# Patient Record
Sex: Female | Born: 1955 | Race: Black or African American | Hispanic: No | Smoking: Current every day smoker
Health system: Southern US, Community
[De-identification: ages and names within clinical notes are randomized; demographics above are authoritative.]

## PROBLEM LIST (undated history)

## (undated) DIAGNOSIS — E119 Type 2 diabetes mellitus without complications: Secondary | ICD-10-CM

## (undated) DIAGNOSIS — I739 Peripheral vascular disease, unspecified: Secondary | ICD-10-CM

## (undated) DIAGNOSIS — I10 Essential (primary) hypertension: Secondary | ICD-10-CM

## (undated) DIAGNOSIS — G629 Polyneuropathy, unspecified: Secondary | ICD-10-CM

## (undated) HISTORY — PX: THROMBECTOMY: SHX45

---

## 2005-07-15 ENCOUNTER — Emergency Department: Payer: Self-pay | Admitting: Emergency Medicine

## 2005-07-15 ENCOUNTER — Other Ambulatory Visit: Payer: Self-pay

## 2005-12-18 ENCOUNTER — Ambulatory Visit: Payer: Self-pay | Admitting: Psychiatry

## 2006-02-06 ENCOUNTER — Ambulatory Visit: Payer: Self-pay | Admitting: Pain Medicine

## 2006-02-22 ENCOUNTER — Ambulatory Visit: Payer: Self-pay | Admitting: Pain Medicine

## 2006-02-28 ENCOUNTER — Ambulatory Visit: Payer: Self-pay | Admitting: Pain Medicine

## 2006-04-05 ENCOUNTER — Ambulatory Visit: Payer: Self-pay | Admitting: Pain Medicine

## 2006-04-16 ENCOUNTER — Ambulatory Visit: Payer: Self-pay | Admitting: Pain Medicine

## 2006-05-22 ENCOUNTER — Ambulatory Visit: Payer: Self-pay | Admitting: Pain Medicine

## 2006-05-28 ENCOUNTER — Ambulatory Visit: Payer: Self-pay | Admitting: Pain Medicine

## 2006-07-12 ENCOUNTER — Ambulatory Visit: Payer: Self-pay | Admitting: Pain Medicine

## 2006-07-26 ENCOUNTER — Ambulatory Visit: Payer: Self-pay | Admitting: Internal Medicine

## 2006-07-30 ENCOUNTER — Ambulatory Visit: Payer: Self-pay | Admitting: Pain Medicine

## 2006-08-28 ENCOUNTER — Ambulatory Visit: Payer: Self-pay | Admitting: Pain Medicine

## 2006-09-03 ENCOUNTER — Ambulatory Visit: Payer: Self-pay | Admitting: Pain Medicine

## 2006-10-25 ENCOUNTER — Ambulatory Visit: Payer: Self-pay | Admitting: Pain Medicine

## 2006-10-31 ENCOUNTER — Ambulatory Visit: Payer: Self-pay | Admitting: Pain Medicine

## 2006-12-04 ENCOUNTER — Ambulatory Visit: Payer: Self-pay | Admitting: Pain Medicine

## 2006-12-12 ENCOUNTER — Ambulatory Visit: Payer: Self-pay | Admitting: Pain Medicine

## 2007-01-30 ENCOUNTER — Ambulatory Visit: Payer: Self-pay | Admitting: Pain Medicine

## 2007-03-14 ENCOUNTER — Ambulatory Visit: Payer: Self-pay | Admitting: Pain Medicine

## 2007-03-20 ENCOUNTER — Ambulatory Visit: Payer: Self-pay | Admitting: Pain Medicine

## 2007-05-02 ENCOUNTER — Ambulatory Visit: Payer: Self-pay | Admitting: Pain Medicine

## 2007-05-06 ENCOUNTER — Ambulatory Visit: Payer: Self-pay | Admitting: Pain Medicine

## 2007-06-11 ENCOUNTER — Ambulatory Visit: Payer: Self-pay | Admitting: Pain Medicine

## 2007-06-22 ENCOUNTER — Ambulatory Visit: Payer: Self-pay | Admitting: Pain Medicine

## 2007-06-26 ENCOUNTER — Ambulatory Visit: Payer: Self-pay | Admitting: Pain Medicine

## 2007-08-22 ENCOUNTER — Ambulatory Visit: Payer: Self-pay | Admitting: Pain Medicine

## 2007-09-02 ENCOUNTER — Ambulatory Visit: Payer: Self-pay | Admitting: Pain Medicine

## 2007-10-15 ENCOUNTER — Ambulatory Visit: Payer: Self-pay | Admitting: Pain Medicine

## 2007-10-30 ENCOUNTER — Ambulatory Visit: Payer: Self-pay | Admitting: Pain Medicine

## 2007-11-14 ENCOUNTER — Ambulatory Visit: Payer: Self-pay | Admitting: Pain Medicine

## 2007-11-20 ENCOUNTER — Ambulatory Visit: Payer: Self-pay | Admitting: Pain Medicine

## 2007-12-10 ENCOUNTER — Ambulatory Visit: Payer: Self-pay | Admitting: Pain Medicine

## 2007-12-18 ENCOUNTER — Ambulatory Visit: Payer: Self-pay | Admitting: Pain Medicine

## 2007-12-27 ENCOUNTER — Ambulatory Visit: Payer: Self-pay | Admitting: Cardiovascular Disease

## 2008-01-02 ENCOUNTER — Ambulatory Visit: Payer: Self-pay | Admitting: Pain Medicine

## 2008-01-06 ENCOUNTER — Ambulatory Visit: Payer: Self-pay | Admitting: Pain Medicine

## 2008-02-11 ENCOUNTER — Ambulatory Visit: Payer: Self-pay | Admitting: Internal Medicine

## 2008-03-11 ENCOUNTER — Ambulatory Visit: Payer: Self-pay | Admitting: Pain Medicine

## 2008-04-07 ENCOUNTER — Ambulatory Visit: Payer: Self-pay | Admitting: Pain Medicine

## 2008-04-13 ENCOUNTER — Ambulatory Visit: Payer: Self-pay | Admitting: Pain Medicine

## 2008-05-05 ENCOUNTER — Ambulatory Visit: Payer: Self-pay | Admitting: Pain Medicine

## 2008-05-13 ENCOUNTER — Ambulatory Visit: Payer: Self-pay | Admitting: Pain Medicine

## 2008-06-04 ENCOUNTER — Ambulatory Visit: Payer: Self-pay | Admitting: Pain Medicine

## 2008-06-10 ENCOUNTER — Ambulatory Visit: Payer: Self-pay | Admitting: Pain Medicine

## 2008-07-15 ENCOUNTER — Ambulatory Visit: Payer: Self-pay | Admitting: Pain Medicine

## 2008-08-18 ENCOUNTER — Ambulatory Visit: Payer: Self-pay | Admitting: Pain Medicine

## 2008-08-26 ENCOUNTER — Ambulatory Visit: Payer: Self-pay | Admitting: Pain Medicine

## 2008-10-01 ENCOUNTER — Ambulatory Visit: Payer: Self-pay | Admitting: Pain Medicine

## 2008-10-07 ENCOUNTER — Ambulatory Visit: Payer: Self-pay | Admitting: Pain Medicine

## 2008-11-10 ENCOUNTER — Ambulatory Visit: Payer: Self-pay | Admitting: Pain Medicine

## 2008-11-18 ENCOUNTER — Ambulatory Visit: Payer: Self-pay | Admitting: Pain Medicine

## 2008-12-07 ENCOUNTER — Ambulatory Visit: Payer: Self-pay | Admitting: Pain Medicine

## 2008-12-10 ENCOUNTER — Ambulatory Visit: Payer: Self-pay | Admitting: Internal Medicine

## 2008-12-16 ENCOUNTER — Ambulatory Visit: Payer: Self-pay | Admitting: Pain Medicine

## 2009-01-07 ENCOUNTER — Ambulatory Visit: Payer: Self-pay | Admitting: Pain Medicine

## 2009-01-13 ENCOUNTER — Ambulatory Visit: Payer: Self-pay | Admitting: Pain Medicine

## 2009-02-04 ENCOUNTER — Ambulatory Visit: Payer: Self-pay | Admitting: Pain Medicine

## 2009-02-10 ENCOUNTER — Ambulatory Visit: Payer: Self-pay | Admitting: Pain Medicine

## 2009-03-02 ENCOUNTER — Ambulatory Visit: Payer: Self-pay | Admitting: Cardiovascular Disease

## 2009-03-09 ENCOUNTER — Ambulatory Visit: Payer: Self-pay | Admitting: Pain Medicine

## 2009-03-17 ENCOUNTER — Ambulatory Visit: Payer: Self-pay | Admitting: Pain Medicine

## 2009-04-06 ENCOUNTER — Ambulatory Visit: Payer: Self-pay | Admitting: Pain Medicine

## 2009-04-12 ENCOUNTER — Ambulatory Visit: Payer: Self-pay | Admitting: Pain Medicine

## 2009-04-15 ENCOUNTER — Inpatient Hospital Stay: Payer: Self-pay | Admitting: Internal Medicine

## 2009-05-26 ENCOUNTER — Encounter: Payer: Self-pay | Admitting: Internal Medicine

## 2009-06-23 ENCOUNTER — Encounter: Payer: Self-pay | Admitting: Internal Medicine

## 2009-07-23 ENCOUNTER — Encounter: Payer: Self-pay | Admitting: Internal Medicine

## 2009-07-23 ENCOUNTER — Ambulatory Visit: Payer: Self-pay | Admitting: Internal Medicine

## 2009-07-28 ENCOUNTER — Ambulatory Visit: Payer: Self-pay | Admitting: Internal Medicine

## 2009-08-05 ENCOUNTER — Ambulatory Visit: Payer: Self-pay | Admitting: Internal Medicine

## 2009-08-09 ENCOUNTER — Ambulatory Visit: Payer: Self-pay | Admitting: Gastroenterology

## 2009-08-24 ENCOUNTER — Ambulatory Visit: Payer: Self-pay | Admitting: Pain Medicine

## 2009-09-01 ENCOUNTER — Ambulatory Visit: Payer: Self-pay | Admitting: Pain Medicine

## 2009-09-20 ENCOUNTER — Ambulatory Visit: Payer: Self-pay | Admitting: Orthopedic Surgery

## 2009-09-27 ENCOUNTER — Ambulatory Visit: Payer: Self-pay | Admitting: Orthopedic Surgery

## 2010-03-20 ENCOUNTER — Emergency Department: Payer: Self-pay | Admitting: Emergency Medicine

## 2010-09-23 ENCOUNTER — Ambulatory Visit: Payer: Self-pay | Admitting: Rheumatology

## 2011-06-08 ENCOUNTER — Ambulatory Visit: Payer: Self-pay | Admitting: Pain Medicine

## 2011-06-12 ENCOUNTER — Ambulatory Visit: Payer: Self-pay | Admitting: Pain Medicine

## 2011-07-11 ENCOUNTER — Ambulatory Visit: Payer: Self-pay | Admitting: Pain Medicine

## 2011-08-01 ENCOUNTER — Ambulatory Visit: Payer: Self-pay | Admitting: Internal Medicine

## 2011-08-02 ENCOUNTER — Ambulatory Visit: Payer: Self-pay | Admitting: Pain Medicine

## 2011-10-24 HISTORY — PX: FEMORAL-POPLITEAL BYPASS GRAFT: SHX937

## 2012-02-06 ENCOUNTER — Ambulatory Visit: Payer: Self-pay | Admitting: Internal Medicine

## 2012-04-23 ENCOUNTER — Ambulatory Visit: Payer: Self-pay | Admitting: Vascular Surgery

## 2012-04-23 LAB — BASIC METABOLIC PANEL
BUN: 17 mg/dL (ref 7–18)
Chloride: 109 mmol/L — ABNORMAL HIGH (ref 98–107)
Creatinine: 1.23 mg/dL (ref 0.60–1.30)
Potassium: 4.5 mmol/L (ref 3.5–5.1)

## 2012-04-24 ENCOUNTER — Inpatient Hospital Stay: Payer: Self-pay | Admitting: Vascular Surgery

## 2012-04-24 ENCOUNTER — Emergency Department: Payer: Self-pay | Admitting: Emergency Medicine

## 2012-04-26 LAB — CBC WITH DIFFERENTIAL/PLATELET
Basophil #: 0 10*3/uL (ref 0.0–0.1)
Eosinophil #: 0.2 10*3/uL (ref 0.0–0.7)
MCH: 29.6 pg (ref 26.0–34.0)
MCHC: 32.1 g/dL (ref 32.0–36.0)
Monocyte #: 1.5 x10 3/mm — ABNORMAL HIGH (ref 0.2–0.9)
Neutrophil %: 73.9 %
Platelet: 207 10*3/uL (ref 150–440)
RBC: 3.43 10*6/uL — ABNORMAL LOW (ref 3.80–5.20)
WBC: 14.8 10*3/uL — ABNORMAL HIGH (ref 3.6–11.0)

## 2012-04-26 LAB — BASIC METABOLIC PANEL
Anion Gap: 7 (ref 7–16)
Calcium, Total: 8.4 mg/dL — ABNORMAL LOW (ref 8.5–10.1)
Chloride: 109 mmol/L — ABNORMAL HIGH (ref 98–107)
Co2: 26 mmol/L (ref 21–32)
Creatinine: 1.4 mg/dL — ABNORMAL HIGH (ref 0.60–1.30)
Osmolality: 284 (ref 275–301)

## 2012-06-05 ENCOUNTER — Ambulatory Visit: Payer: Self-pay | Admitting: Pain Medicine

## 2012-06-17 ENCOUNTER — Ambulatory Visit: Payer: Self-pay | Admitting: Pain Medicine

## 2012-07-11 ENCOUNTER — Ambulatory Visit: Payer: Self-pay | Admitting: Pain Medicine

## 2012-07-29 ENCOUNTER — Ambulatory Visit: Payer: Self-pay | Admitting: Pain Medicine

## 2012-08-06 ENCOUNTER — Ambulatory Visit: Payer: Self-pay | Admitting: Internal Medicine

## 2012-08-20 ENCOUNTER — Ambulatory Visit: Payer: Self-pay | Admitting: Pain Medicine

## 2012-08-20 ENCOUNTER — Ambulatory Visit: Payer: Self-pay | Admitting: Otolaryngology

## 2012-08-20 LAB — CREATININE, SERUM
Creatinine: 1.22 mg/dL (ref 0.60–1.30)
EGFR (Non-African Amer.): 49 — ABNORMAL LOW

## 2012-09-04 ENCOUNTER — Ambulatory Visit: Payer: Self-pay | Admitting: Pain Medicine

## 2012-10-03 ENCOUNTER — Ambulatory Visit: Payer: Self-pay | Admitting: Pain Medicine

## 2012-10-09 ENCOUNTER — Ambulatory Visit: Payer: Self-pay | Admitting: Pain Medicine

## 2012-10-09 LAB — CREATININE, SERUM: EGFR (Non-African Amer.): 45 — ABNORMAL LOW

## 2012-10-14 ENCOUNTER — Ambulatory Visit: Payer: Self-pay | Admitting: Pain Medicine

## 2012-10-29 ENCOUNTER — Ambulatory Visit: Payer: Self-pay | Admitting: Pain Medicine

## 2012-10-30 ENCOUNTER — Inpatient Hospital Stay: Payer: Self-pay | Admitting: Vascular Surgery

## 2012-10-30 LAB — CBC
HCT: 40.4 % (ref 35.0–47.0)
HGB: 13 g/dL (ref 12.0–16.0)
MCHC: 32.2 g/dL (ref 32.0–36.0)
MCV: 93 fL (ref 80–100)
Platelet: 298 10*3/uL (ref 150–440)
RBC: 4.33 10*6/uL (ref 3.80–5.20)
WBC: 7.7 10*3/uL (ref 3.6–11.0)

## 2012-10-30 LAB — COMPREHENSIVE METABOLIC PANEL
Alkaline Phosphatase: 64 U/L (ref 50–136)
BUN: 18 mg/dL (ref 7–18)
EGFR (African American): 49 — ABNORMAL LOW
EGFR (Non-African Amer.): 42 — ABNORMAL LOW
Glucose: 113 mg/dL — ABNORMAL HIGH (ref 65–99)
Osmolality: 293 (ref 275–301)
SGPT (ALT): 58 U/L (ref 12–78)
Sodium: 146 mmol/L — ABNORMAL HIGH (ref 136–145)
Total Protein: 6 g/dL — ABNORMAL LOW (ref 6.4–8.2)

## 2012-10-30 LAB — APTT: Activated PTT: 30.9 secs (ref 23.6–35.9)

## 2012-10-30 LAB — PROTIME-INR: Prothrombin Time: 13 secs (ref 11.5–14.7)

## 2012-10-31 LAB — CBC WITH DIFFERENTIAL/PLATELET
Basophil #: 0 10*3/uL (ref 0.0–0.1)
Basophil #: 0.1 10*3/uL (ref 0.0–0.1)
Basophil %: 0.2 %
Basophil %: 0.8 %
Eosinophil #: 0.2 10*3/uL (ref 0.0–0.7)
Eosinophil %: 3.4 %
Eosinophil %: 1.3 %
HCT: 36.3 % (ref 35.0–47.0)
HCT: 35.5 % (ref 35.0–47.0)
HGB: 11.9 g/dL — ABNORMAL LOW (ref 12.0–16.0)
HGB: 11.6 g/dL — ABNORMAL LOW (ref 12.0–16.0)
Lymphocyte #: 2.5 10*3/uL (ref 1.0–3.6)
Lymphocyte #: 1.5 10*3/uL (ref 1.0–3.6)
Lymphocyte %: 33.2 %
Lymphocyte %: 34 %
Lymphocyte %: 16.2 %
MCH: 30.8 pg (ref 26.0–34.0)
MCH: 30.7 pg (ref 26.0–34.0)
MCV: 94 fL (ref 80–100)
Monocyte #: 0.5 x10 3/mm (ref 0.2–0.9)
Monocyte #: 0.5 x10 3/mm (ref 0.2–0.9)
Monocyte #: 0.8 x10 3/mm (ref 0.2–0.9)
Monocyte %: 7.1 %
Monocyte %: 8.7 %
Neutrophil #: 4.3 10*3/uL (ref 1.4–6.5)
Neutrophil #: 6.6 10*3/uL — ABNORMAL HIGH (ref 1.4–6.5)
Neutrophil %: 57.1 %
Platelet: 257 10*3/uL (ref 150–440)
RBC: 3.76 10*6/uL — ABNORMAL LOW (ref 3.80–5.20)
RDW: 12.9 % (ref 11.5–14.5)
WBC: 9.1 10*3/uL (ref 3.6–11.0)

## 2012-10-31 LAB — BASIC METABOLIC PANEL
Anion Gap: 10 (ref 7–16)
BUN: 22 mg/dL — ABNORMAL HIGH (ref 7–18)
Calcium, Total: 8.4 mg/dL — ABNORMAL LOW (ref 8.5–10.1)
Chloride: 106 mmol/L (ref 98–107)
Co2: 29 mmol/L (ref 21–32)
EGFR (African American): 41 — ABNORMAL LOW
EGFR (Non-African Amer.): 35 — ABNORMAL LOW
Potassium: 3.1 mmol/L — ABNORMAL LOW (ref 3.5–5.1)
Sodium: 145 mmol/L (ref 136–145)

## 2012-10-31 LAB — APTT: Activated PTT: 125.5 secs — ABNORMAL HIGH (ref 23.6–35.9)

## 2012-11-01 LAB — BASIC METABOLIC PANEL WITH GFR
Anion Gap: 5 — ABNORMAL LOW
BUN: 14 mg/dL
Calcium, Total: 7.7 mg/dL — ABNORMAL LOW
Chloride: 110 mmol/L — ABNORMAL HIGH
Co2: 30 mmol/L
Creatinine: 1.11 mg/dL
EGFR (African American): >60
EGFR (Non-African Amer.): 55 — ABNORMAL LOW
Glucose: 109 mg/dL — ABNORMAL HIGH
Osmolality: 290
Potassium: 3.6 mmol/L
Sodium: 145 mmol/L

## 2012-11-28 ENCOUNTER — Ambulatory Visit: Payer: Self-pay | Admitting: Pain Medicine

## 2012-12-11 ENCOUNTER — Ambulatory Visit: Payer: Self-pay | Admitting: Pain Medicine

## 2012-12-24 ENCOUNTER — Inpatient Hospital Stay: Payer: Self-pay | Admitting: Physician Assistant

## 2012-12-24 LAB — CBC
HGB: 13.8 g/dL (ref 12.0–16.0)
MCH: 29.8 pg (ref 26.0–34.0)
MCV: 94 fL (ref 80–100)
Platelet: 362 10*3/uL (ref 150–440)
RBC: 4.62 10*6/uL (ref 3.80–5.20)
WBC: 8.1 10*3/uL (ref 3.6–11.0)

## 2012-12-24 LAB — COMPREHENSIVE METABOLIC PANEL
Alkaline Phosphatase: 63 U/L (ref 50–136)
Anion Gap: 3 — ABNORMAL LOW (ref 7–16)
BUN: 19 mg/dL — ABNORMAL HIGH (ref 7–18)
Calcium, Total: 8.8 mg/dL (ref 8.5–10.1)
EGFR (African American): 52 — ABNORMAL LOW
EGFR (Non-African Amer.): 45 — ABNORMAL LOW
Glucose: 64 mg/dL — ABNORMAL LOW (ref 65–99)
Osmolality: 285 (ref 275–301)
Potassium: 3.3 mmol/L — ABNORMAL LOW (ref 3.5–5.1)
Total Protein: 6.7 g/dL (ref 6.4–8.2)

## 2012-12-24 LAB — APTT: Activated PTT: 29.7 secs (ref 23.6–35.9)

## 2012-12-24 LAB — PROTIME-INR: Prothrombin Time: 13 secs (ref 11.5–14.7)

## 2012-12-25 LAB — APTT
Activated PTT: 86.3 secs — ABNORMAL HIGH (ref 23.6–35.9)
Activated PTT: 69.4 secs — ABNORMAL HIGH (ref 23.6–35.9)

## 2012-12-26 LAB — BASIC METABOLIC PANEL
BUN: 18 mg/dL (ref 7–18)
Creatinine: 1.28 mg/dL (ref 0.60–1.30)
EGFR (Non-African Amer.): 46 — ABNORMAL LOW
Osmolality: 284 (ref 275–301)
Potassium: 3.7 mmol/L (ref 3.5–5.1)
Sodium: 142 mmol/L (ref 136–145)

## 2012-12-26 LAB — APTT: Activated PTT: 52.1 secs — ABNORMAL HIGH (ref 23.6–35.9)

## 2012-12-26 LAB — CBC WITH DIFFERENTIAL/PLATELET
Basophil #: 0 10*3/uL (ref 0.0–0.1)
Basophil %: 0.7 %
Eosinophil #: 0.2 10*3/uL (ref 0.0–0.7)
Eosinophil %: 2.9 %
HCT: 38.1 % (ref 35.0–47.0)
Lymphocyte #: 2.2 10*3/uL (ref 1.0–3.6)
MCV: 94 fL (ref 80–100)
Monocyte %: 11.9 %
Neutrophil #: 3.3 10*3/uL (ref 1.4–6.5)
RDW: 13.6 % (ref 11.5–14.5)
WBC: 6.5 10*3/uL (ref 3.6–11.0)

## 2012-12-27 LAB — BASIC METABOLIC PANEL
Anion Gap: 8 (ref 7–16)
BUN: 12 mg/dL (ref 7–18)
Calcium, Total: 8.3 mg/dL — ABNORMAL LOW (ref 8.5–10.1)
Chloride: 107 mmol/L (ref 98–107)
Co2: 27 mmol/L (ref 21–32)
EGFR (Non-African Amer.): >60
Glucose: 137 mg/dL — ABNORMAL HIGH (ref 65–99)
Osmolality: 285 (ref 275–301)
Sodium: 142 mmol/L (ref 136–145)

## 2012-12-27 LAB — APTT: Activated PTT: 75.7 secs — ABNORMAL HIGH (ref 23.6–35.9)

## 2012-12-28 LAB — APTT: Activated PTT: 36.3 secs — ABNORMAL HIGH (ref 23.6–35.9)

## 2012-12-28 LAB — CBC WITH DIFFERENTIAL/PLATELET
Basophil #: 0.1 10*3/uL (ref 0.0–0.1)
Eosinophil %: 1 %
HGB: 11.7 g/dL — ABNORMAL LOW (ref 12.0–16.0)
MCHC: 32.2 g/dL (ref 32.0–36.0)
Monocyte #: 0.8 x10 3/mm (ref 0.2–0.9)
Monocyte %: 6.6 %
Neutrophil #: 9.2 10*3/uL — ABNORMAL HIGH (ref 1.4–6.5)
RBC: 3.87 10*6/uL (ref 3.80–5.20)
RDW: 13.2 % (ref 11.5–14.5)
WBC: 12.5 10*3/uL — ABNORMAL HIGH (ref 3.6–11.0)

## 2012-12-29 LAB — CBC WITH DIFFERENTIAL/PLATELET
Basophil %: 0.7 %
Lymphocyte %: 15.8 %
MCH: 31.3 pg (ref 26.0–34.0)
MCHC: 33.5 g/dL (ref 32.0–36.0)
Monocyte %: 12.2 %
Neutrophil #: 6.9 10*3/uL — ABNORMAL HIGH (ref 1.4–6.5)
Neutrophil %: 69.2 %
Platelet: 225 10*3/uL (ref 150–440)
RBC: 3.46 10*6/uL — ABNORMAL LOW (ref 3.80–5.20)
RDW: 13.1 % (ref 11.5–14.5)

## 2012-12-29 LAB — BASIC METABOLIC PANEL
Anion Gap: 7 (ref 7–16)
BUN: 11 mg/dL (ref 7–18)
Calcium, Total: 8.7 mg/dL (ref 8.5–10.1)
Chloride: 103 mmol/L (ref 98–107)
Co2: 30 mmol/L (ref 21–32)
Glucose: 126 mg/dL — ABNORMAL HIGH (ref 65–99)
Osmolality: 280 (ref 275–301)
Potassium: 3.7 mmol/L (ref 3.5–5.1)

## 2012-12-29 LAB — APTT: Activated PTT: 55.9 secs — ABNORMAL HIGH (ref 23.6–35.9)

## 2012-12-30 LAB — CBC WITH DIFFERENTIAL/PLATELET
Basophil #: 0.1 10*3/uL (ref 0.0–0.1)
Eosinophil %: 2.9 %
HCT: 30.4 % — ABNORMAL LOW (ref 35.0–47.0)
Lymphocyte %: 23.1 %
MCH: 30.4 pg (ref 26.0–34.0)
MCHC: 32.6 g/dL (ref 32.0–36.0)
Monocyte %: 13 %
Neutrophil #: 4.9 10*3/uL (ref 1.4–6.5)
Neutrophil %: 60.1 %
RDW: 13.2 % (ref 11.5–14.5)
WBC: 8.1 10*3/uL (ref 3.6–11.0)

## 2012-12-30 LAB — URINALYSIS, COMPLETE
Glucose,UR: NEGATIVE mg/dL (ref 0–75)
Nitrite: NEGATIVE
Protein: NEGATIVE
Squamous Epithelial: 2
WBC UR: 2 /HPF (ref 0–5)

## 2012-12-30 LAB — APTT
Activated PTT: 50.9 secs — ABNORMAL HIGH (ref 23.6–35.9)
Activated PTT: 87.6 secs — ABNORMAL HIGH (ref 23.6–35.9)

## 2012-12-30 LAB — BASIC METABOLIC PANEL
Calcium, Total: 8 mg/dL — ABNORMAL LOW (ref 8.5–10.1)
Co2: 30 mmol/L (ref 21–32)
EGFR (Non-African Amer.): 39 — ABNORMAL LOW
Glucose: 156 mg/dL — ABNORMAL HIGH (ref 65–99)
Potassium: 3.7 mmol/L (ref 3.5–5.1)

## 2012-12-31 LAB — PROTIME-INR: Prothrombin Time: 18.4 secs — ABNORMAL HIGH (ref 11.5–14.7)

## 2012-12-31 LAB — APTT: Activated PTT: 84.8 secs — ABNORMAL HIGH (ref 23.6–35.9)

## 2013-01-01 LAB — APTT: Activated PTT: 97.5 secs — ABNORMAL HIGH (ref 23.6–35.9)

## 2013-01-01 LAB — PROTIME-INR: INR: 2.5

## 2013-01-01 LAB — PLATELET COUNT: Platelet: 270 10*3/uL (ref 150–440)

## 2013-01-01 LAB — HEMOGLOBIN: HGB: 10 g/dL — ABNORMAL LOW (ref 12.0–16.0)

## 2013-01-18 ENCOUNTER — Emergency Department: Payer: Self-pay | Admitting: Unknown Physician Specialty

## 2013-01-18 LAB — COMPREHENSIVE METABOLIC PANEL
Anion Gap: 4 — ABNORMAL LOW (ref 7–16)
BUN: 24 mg/dL — ABNORMAL HIGH (ref 7–18)
Chloride: 110 mmol/L — ABNORMAL HIGH (ref 98–107)
Co2: 28 mmol/L (ref 21–32)
Osmolality: 287 (ref 275–301)
Potassium: 3.7 mmol/L (ref 3.5–5.1)
SGOT(AST): 38 U/L — ABNORMAL HIGH (ref 15–37)
SGPT (ALT): 35 U/L (ref 12–78)
Total Protein: 5.9 g/dL — ABNORMAL LOW (ref 6.4–8.2)

## 2013-01-18 LAB — CBC
HGB: 9.4 g/dL — ABNORMAL LOW (ref 12.0–16.0)
RBC: 3.07 10*6/uL — ABNORMAL LOW (ref 3.80–5.20)
RDW: 14.9 % — ABNORMAL HIGH (ref 11.5–14.5)
WBC: 10 10*3/uL (ref 3.6–11.0)

## 2013-01-24 LAB — WOUND CULTURE

## 2013-01-28 ENCOUNTER — Ambulatory Visit: Payer: Self-pay | Admitting: Pain Medicine

## 2013-01-31 ENCOUNTER — Emergency Department: Payer: Self-pay | Admitting: Emergency Medicine

## 2013-01-31 LAB — COMPREHENSIVE METABOLIC PANEL
Alkaline Phosphatase: 64 U/L (ref 50–136)
Calcium, Total: 7.9 mg/dL — ABNORMAL LOW (ref 8.5–10.1)
Chloride: 107 mmol/L (ref 98–107)
Creatinine: 2.17 mg/dL — ABNORMAL HIGH (ref 0.60–1.30)
EGFR (African American): 28 — ABNORMAL LOW
EGFR (Non-African Amer.): 24 — ABNORMAL LOW
Glucose: 112 mg/dL — ABNORMAL HIGH (ref 65–99)
Potassium: 3.4 mmol/L — ABNORMAL LOW (ref 3.5–5.1)
SGPT (ALT): 32 U/L (ref 12–78)
Total Protein: 6 g/dL — ABNORMAL LOW (ref 6.4–8.2)

## 2013-01-31 LAB — TROPONIN I: Troponin-I: <0.02 ng/mL

## 2013-01-31 LAB — URINALYSIS, COMPLETE
Bilirubin,UR: NEGATIVE
Blood: NEGATIVE
Hyaline Cast: 8
Ketone: NEGATIVE
Leukocyte Esterase: NEGATIVE
Ph: 5 (ref 4.5–8.0)
WBC UR: 3 /HPF (ref 0–5)

## 2013-01-31 LAB — CBC
HGB: 9.6 g/dL — ABNORMAL LOW (ref 12.0–16.0)
MCH: 29.8 pg (ref 26.0–34.0)
MCV: 93 fL (ref 80–100)
WBC: 11.3 10*3/uL — ABNORMAL HIGH (ref 3.6–11.0)

## 2013-02-17 ENCOUNTER — Ambulatory Visit: Payer: Self-pay | Admitting: Registered Nurse

## 2013-02-27 ENCOUNTER — Ambulatory Visit: Payer: Self-pay | Admitting: Pain Medicine

## 2013-04-01 ENCOUNTER — Ambulatory Visit: Payer: Self-pay | Admitting: Pain Medicine

## 2013-04-04 LAB — CBC WITH DIFFERENTIAL/PLATELET
Basophil %: 0.3 %
Eosinophil %: 2.3 %
HCT: 23.3 % — ABNORMAL LOW (ref 35.0–47.0)
Lymphocyte #: 1.5 10*3/uL (ref 1.0–3.6)
MCHC: 31.6 g/dL — ABNORMAL LOW (ref 32.0–36.0)
Monocyte %: 10.4 %
Neutrophil %: 67 %
Platelet: 268 10*3/uL (ref 150–440)
RBC: 2.92 10*6/uL — ABNORMAL LOW (ref 3.80–5.20)
RDW: 20.3 % — ABNORMAL HIGH (ref 11.5–14.5)
WBC: 7.5 10*3/uL (ref 3.6–11.0)

## 2013-04-04 LAB — COMPREHENSIVE METABOLIC PANEL
Alkaline Phosphatase: 58 U/L (ref 50–136)
Anion Gap: 6 — ABNORMAL LOW (ref 7–16)
BUN: 26 mg/dL — ABNORMAL HIGH (ref 7–18)
Calcium, Total: 8.2 mg/dL — ABNORMAL LOW (ref 8.5–10.1)
Co2: 29 mmol/L (ref 21–32)
EGFR (African American): 30 — ABNORMAL LOW
Glucose: 108 mg/dL — ABNORMAL HIGH (ref 65–99)
Osmolality: 281 (ref 275–301)
Potassium: 3 mmol/L — ABNORMAL LOW (ref 3.5–5.1)
SGPT (ALT): 29 U/L (ref 12–78)
Sodium: 138 mmol/L (ref 136–145)
Total Protein: 5.6 g/dL — ABNORMAL LOW (ref 6.4–8.2)

## 2013-04-04 LAB — PROTIME-INR
INR: 1.7
Prothrombin Time: 19.7 secs — ABNORMAL HIGH (ref 11.5–14.7)

## 2013-04-05 ENCOUNTER — Inpatient Hospital Stay: Payer: Self-pay | Admitting: Internal Medicine

## 2013-04-05 LAB — CK TOTAL AND CKMB (NOT AT ARMC)
CK, Total: 448 U/L — ABNORMAL HIGH
CK-MB: 3.2 ng/mL (ref 0.5–3.6)
CK-MB: 2.7 ng/mL

## 2013-04-05 LAB — PROTIME-INR: Prothrombin Time: 18.2 secs — ABNORMAL HIGH (ref 11.5–14.7)

## 2013-04-05 LAB — BASIC METABOLIC PANEL
Anion Gap: 8 (ref 7–16)
Calcium, Total: 8.2 mg/dL — ABNORMAL LOW (ref 8.5–10.1)
Chloride: 105 mmol/L (ref 98–107)
Co2: 29 mmol/L (ref 21–32)
Creatinine: 1.89 mg/dL — ABNORMAL HIGH (ref 0.60–1.30)
EGFR (African American): 34 — ABNORMAL LOW
EGFR (Non-African Amer.): 29 — ABNORMAL LOW
Glucose: 101 mg/dL — ABNORMAL HIGH (ref 65–99)
Osmolality: 288 (ref 275–301)
Potassium: 3.4 mmol/L — ABNORMAL LOW (ref 3.5–5.1)

## 2013-04-05 LAB — CBC WITH DIFFERENTIAL/PLATELET
Basophil #: 0 10*3/uL (ref 0.0–0.1)
Eosinophil #: 0.2 10*3/uL (ref 0.0–0.7)
Lymphocyte #: 1.5 10*3/uL (ref 1.0–3.6)
MCH: 26.3 pg (ref 26.0–34.0)
MCV: 81 fL (ref 80–100)
Monocyte %: 13.2 %
Neutrophil #: 4.8 10*3/uL (ref 1.4–6.5)
Platelet: 275 10*3/uL (ref 150–440)
RBC: 3.3 10*6/uL — ABNORMAL LOW (ref 3.80–5.20)

## 2013-04-05 LAB — LIPID PANEL
Cholesterol: 54 mg/dL (ref 0–200)
Triglycerides: 63 mg/dL (ref 0–200)
VLDL Cholesterol, Calc: 13 mg/dL (ref 5–40)

## 2013-04-05 LAB — URINALYSIS, COMPLETE
Bilirubin,UR: NEGATIVE
Blood: NEGATIVE
Glucose,UR: NEGATIVE mg/dL (ref 0–75)
Hyaline Cast: 8
Ketone: NEGATIVE
Nitrite: NEGATIVE
Ph: 5 (ref 4.5–8.0)
RBC,UR: <1 /HPF (ref 0–5)
Squamous Epithelial: 2
WBC UR: 1 /HPF (ref 0–5)

## 2013-04-05 LAB — TROPONIN I
Troponin-I: 0.06 ng/mL — ABNORMAL HIGH
Troponin-I: 0.04 ng/mL

## 2013-04-05 LAB — MAGNESIUM: Magnesium: 2.1 mg/dL

## 2013-04-06 LAB — CBC WITH DIFFERENTIAL/PLATELET
Basophil #: 0.1 10*3/uL (ref 0.0–0.1)
Basophil %: 0.9 %
Eosinophil %: 4.5 %
Lymphocyte #: 1.6 10*3/uL (ref 1.0–3.6)
Lymphocyte %: 24 %
MCH: 25.8 pg — ABNORMAL LOW (ref 26.0–34.0)
MCHC: 32.1 g/dL (ref 32.0–36.0)
Monocyte #: 0.8 x10 3/mm (ref 0.2–0.9)
Neutrophil #: 4 10*3/uL (ref 1.4–6.5)
Platelet: 272 10*3/uL (ref 150–440)
RBC: 3.11 10*6/uL — ABNORMAL LOW (ref 3.80–5.20)
RDW: 19.3 % — ABNORMAL HIGH (ref 11.5–14.5)

## 2013-04-06 LAB — BASIC METABOLIC PANEL
Anion Gap: 5 — ABNORMAL LOW (ref 7–16)
BUN: 17 mg/dL (ref 7–18)
EGFR (African American): 49 — ABNORMAL LOW
Potassium: 3.9 mmol/L (ref 3.5–5.1)

## 2013-04-06 LAB — CK TOTAL AND CKMB (NOT AT ARMC)
CK, Total: 371 U/L — ABNORMAL HIGH (ref 21–215)
CK-MB: 2.5 ng/mL (ref 0.5–3.6)

## 2013-04-06 LAB — TSH: Thyroid Stimulating Horm: 0.413 u[IU]/mL — ABNORMAL LOW

## 2013-04-06 LAB — TROPONIN I: Troponin-I: 0.04 ng/mL

## 2013-04-07 LAB — CBC WITH DIFFERENTIAL/PLATELET
Basophil #: 0.1 10*3/uL (ref 0.0–0.1)
Basophil %: 0.7 %
Eosinophil %: 3 %
HCT: 28.9 % — ABNORMAL LOW (ref 35.0–47.0)
HGB: 9.4 g/dL — ABNORMAL LOW (ref 12.0–16.0)
Lymphocyte %: 21.6 %
MCH: 25.9 pg — ABNORMAL LOW (ref 26.0–34.0)
MCHC: 32.4 g/dL (ref 32.0–36.0)
MCV: 80 fL (ref 80–100)
Monocyte #: 1 x10 3/mm — ABNORMAL HIGH (ref 0.2–0.9)
Neutrophil #: 4.7 10*3/uL (ref 1.4–6.5)
Neutrophil %: 61.7 %
RDW: 19.3 % — ABNORMAL HIGH (ref 11.5–14.5)

## 2013-04-07 LAB — BASIC METABOLIC PANEL
Anion Gap: 6 — ABNORMAL LOW (ref 7–16)
BUN: 11 mg/dL (ref 7–18)
Calcium, Total: 7.9 mg/dL — ABNORMAL LOW (ref 8.5–10.1)
Chloride: 114 mmol/L — ABNORMAL HIGH (ref 98–107)
Co2: 26 mmol/L (ref 21–32)
Glucose: 96 mg/dL (ref 65–99)
Osmolality: 290 (ref 275–301)
Potassium: 3.3 mmol/L — ABNORMAL LOW (ref 3.5–5.1)
Sodium: 146 mmol/L — ABNORMAL HIGH (ref 136–145)

## 2013-04-07 LAB — MAGNESIUM: Magnesium: 1.6 mg/dL — ABNORMAL LOW

## 2013-04-08 LAB — CBC WITH DIFFERENTIAL/PLATELET
Basophil %: 0.5 %
Eosinophil %: 2.9 %
Lymphocyte #: 1.3 10*3/uL (ref 1.0–3.6)
Lymphocyte %: 18.1 %
MCH: 25.9 pg — ABNORMAL LOW (ref 26.0–34.0)
MCV: 80 fL (ref 80–100)
Monocyte #: 0.8 x10 3/mm (ref 0.2–0.9)
Neutrophil %: 66.6 %
RBC: 3.85 10*6/uL (ref 3.80–5.20)
RDW: 18.9 % — ABNORMAL HIGH (ref 11.5–14.5)
WBC: 7.1 10*3/uL (ref 3.6–11.0)

## 2013-04-08 LAB — MAGNESIUM: Magnesium: 1.6 mg/dL — ABNORMAL LOW

## 2013-04-08 LAB — BASIC METABOLIC PANEL
Anion Gap: 6 — ABNORMAL LOW (ref 7–16)
BUN: 9 mg/dL (ref 7–18)
Chloride: 110 mmol/L — ABNORMAL HIGH (ref 98–107)
Co2: 27 mmol/L (ref 21–32)
EGFR (African American): >60
Glucose: 141 mg/dL — ABNORMAL HIGH (ref 65–99)
Potassium: 3.4 mmol/L — ABNORMAL LOW (ref 3.5–5.1)

## 2013-05-01 ENCOUNTER — Ambulatory Visit: Payer: Self-pay | Admitting: Pain Medicine

## 2013-05-27 ENCOUNTER — Inpatient Hospital Stay: Payer: Self-pay | Admitting: Vascular Surgery

## 2013-05-27 LAB — COMPREHENSIVE METABOLIC PANEL
Albumin: 3 g/dL — ABNORMAL LOW (ref 3.4–5.0)
Alkaline Phosphatase: 66 U/L (ref 50–136)
BUN: 21 mg/dL — ABNORMAL HIGH (ref 7–18)
Chloride: 109 mmol/L — ABNORMAL HIGH (ref 98–107)
Creatinine: 1.47 mg/dL — ABNORMAL HIGH (ref 0.60–1.30)
EGFR (Non-African Amer.): 39 — ABNORMAL LOW
Glucose: 110 mg/dL — ABNORMAL HIGH (ref 65–99)
SGOT(AST): 79 U/L — ABNORMAL HIGH (ref 15–37)
Sodium: 142 mmol/L (ref 136–145)
Total Protein: 6.2 g/dL — ABNORMAL LOW (ref 6.4–8.2)

## 2013-05-27 LAB — CBC
HGB: 12.9 g/dL (ref 12.0–16.0)
Platelet: 263 10*3/uL (ref 150–440)
RBC: 4.62 10*6/uL (ref 3.80–5.20)
WBC: 9.2 10*3/uL (ref 3.6–11.0)

## 2013-05-27 LAB — PROTIME-INR: Prothrombin Time: 15.9 secs — ABNORMAL HIGH (ref 11.5–14.7)

## 2013-05-28 LAB — CBC WITH DIFFERENTIAL/PLATELET
Basophil #: 0 10*3/uL (ref 0.0–0.1)
Basophil #: 0.1 10*3/uL (ref 0.0–0.1)
Basophil %: 1.1 %
Eosinophil #: 0.1 10*3/uL (ref 0.0–0.7)
Eosinophil #: 0.2 10*3/uL (ref 0.0–0.7)
Eosinophil %: 2.9 %
HCT: 34.1 % — ABNORMAL LOW (ref 35.0–47.0)
HGB: 11.3 g/dL — ABNORMAL LOW (ref 12.0–16.0)
Lymphocyte #: 1.5 10*3/uL (ref 1.0–3.6)
Lymphocyte %: 22.6 %
Lymphocyte %: 7.3 %
MCH: 28.1 pg (ref 26.0–34.0)
MCHC: 33.4 g/dL (ref 32.0–36.0)
MCV: 84 fL (ref 80–100)
MCV: 84 fL (ref 80–100)
Monocyte #: 0.7 x10 3/mm (ref 0.2–0.9)
Neutrophil %: 66.6 %
Platelet: 216 10*3/uL (ref 150–440)
RDW: 20.7 % — ABNORMAL HIGH (ref 11.5–14.5)
WBC: 10.6 10*3/uL (ref 3.6–11.0)
WBC: 6.5 10*3/uL (ref 3.6–11.0)

## 2013-05-28 LAB — BASIC METABOLIC PANEL
BUN: 12 mg/dL (ref 7–18)
Chloride: 111 mmol/L — ABNORMAL HIGH (ref 98–107)
Creatinine: 0.94 mg/dL (ref 0.60–1.30)
EGFR (Non-African Amer.): >60
Glucose: 147 mg/dL — ABNORMAL HIGH (ref 65–99)
Potassium: 3.1 mmol/L — ABNORMAL LOW (ref 3.5–5.1)

## 2013-05-28 LAB — APTT: Activated PTT: 59.2 secs — ABNORMAL HIGH (ref 23.6–35.9)

## 2013-05-28 LAB — FIBRINOGEN: Fibrinogen: 283 mg/dL (ref 210–470)

## 2013-05-30 LAB — CBC WITH DIFFERENTIAL/PLATELET
Basophil %: 0.4 %
Eosinophil %: 1.4 %
HCT: 36.5 % (ref 35.0–47.0)
HGB: 12.2 g/dL (ref 12.0–16.0)
Lymphocyte #: 1.9 10*3/uL (ref 1.0–3.6)
MCH: 28 pg (ref 26.0–34.0)
MCV: 84 fL (ref 80–100)
RBC: 4.36 10*6/uL (ref 3.80–5.20)

## 2013-05-30 LAB — BASIC METABOLIC PANEL
Anion Gap: 4 — ABNORMAL LOW (ref 7–16)
BUN: 9 mg/dL (ref 7–18)
Calcium, Total: 8.8 mg/dL (ref 8.5–10.1)
Chloride: 109 mmol/L — ABNORMAL HIGH (ref 98–107)
Co2: 27 mmol/L (ref 21–32)
Glucose: 118 mg/dL — ABNORMAL HIGH (ref 65–99)
Osmolality: 279 (ref 275–301)

## 2013-06-18 ENCOUNTER — Inpatient Hospital Stay: Payer: Self-pay | Admitting: Vascular Surgery

## 2013-06-18 LAB — APTT: Activated PTT: 29 secs (ref 23.6–35.9)

## 2013-06-18 LAB — COMPREHENSIVE METABOLIC PANEL
Albumin: 3.4 g/dL (ref 3.4–5.0)
Alkaline Phosphatase: 66 U/L (ref 50–136)
Bilirubin,Total: 0.6 mg/dL (ref 0.2–1.0)
Co2: 28 mmol/L (ref 21–32)
EGFR (African American): 35 — ABNORMAL LOW
EGFR (Non-African Amer.): 30 — ABNORMAL LOW
Glucose: 96 mg/dL (ref 65–99)
Osmolality: 281 (ref 275–301)
Potassium: 3.1 mmol/L — ABNORMAL LOW (ref 3.5–5.1)
Sodium: 139 mmol/L (ref 136–145)
Total Protein: 7 g/dL (ref 6.4–8.2)

## 2013-06-18 LAB — CBC WITH DIFFERENTIAL/PLATELET
Basophil #: 0.1 10*3/uL (ref 0.0–0.1)
Eosinophil %: 1.1 %
HGB: 12.7 g/dL (ref 12.0–16.0)
Lymphocyte #: 1.6 10*3/uL (ref 1.0–3.6)
Lymphocyte %: 20.6 %
MCHC: 32.9 g/dL (ref 32.0–36.0)
MCV: 85 fL (ref 80–100)
Monocyte %: 7.2 %
WBC: 7.9 10*3/uL (ref 3.6–11.0)

## 2013-06-18 LAB — PROTIME-INR
INR: 1.1
Prothrombin Time: 14 secs (ref 11.5–14.7)

## 2013-06-19 LAB — HEMOGLOBIN
HGB: 11.3 g/dL — ABNORMAL LOW (ref 12.0–16.0)
HGB: 10.8 g/dL — ABNORMAL LOW (ref 12.0–16.0)

## 2013-06-19 LAB — APTT: Activated PTT: >160 secs (ref 23.6–35.9)

## 2013-06-20 LAB — APTT: Activated PTT: 63.4 secs — ABNORMAL HIGH (ref 23.6–35.9)

## 2013-06-20 LAB — FIBRINOGEN
Fibrinogen: 128 mg/dL — ABNORMAL LOW (ref 210–470)
Fibrinogen: 127 mg/dL — ABNORMAL LOW (ref 210–470)

## 2013-06-20 LAB — HEMOGLOBIN: HGB: 10.4 g/dL — ABNORMAL LOW (ref 12.0–16.0)

## 2013-06-20 LAB — PROTIME-INR: Prothrombin Time: 16.4 secs — ABNORMAL HIGH (ref 11.5–14.7)

## 2013-06-21 ENCOUNTER — Inpatient Hospital Stay (HOSPITAL_COMMUNITY)
Admission: AD | Admit: 2013-06-21 | Discharge: 2013-07-01 | DRG: 240 | Disposition: A | Discharge status: Rehabilitation Facility | Payer: PRIVATE HEALTH INSURANCE | Source: Other Acute Inpatient Hospital | Attending: Internal Medicine | Admitting: Internal Medicine

## 2013-06-21 ENCOUNTER — Emergency Department: Payer: Self-pay | Admitting: Emergency Medicine

## 2013-06-21 ENCOUNTER — Encounter (HOSPITAL_COMMUNITY)
Admission: AD | Disposition: A | Discharge status: Rehabilitation Facility | Payer: Self-pay | Source: Other Acute Inpatient Hospital | Attending: Internal Medicine

## 2013-06-21 DIAGNOSIS — I498 Other specified cardiac arrhythmias: Secondary | ICD-10-CM | POA: Diagnosis not present

## 2013-06-21 DIAGNOSIS — K5900 Constipation, unspecified: Secondary | ICD-10-CM | POA: Diagnosis not present

## 2013-06-21 DIAGNOSIS — I999 Unspecified disorder of circulatory system: Secondary | ICD-10-CM

## 2013-06-21 DIAGNOSIS — I10 Essential (primary) hypertension: Secondary | ICD-10-CM | POA: Diagnosis present

## 2013-06-21 DIAGNOSIS — D62 Acute posthemorrhagic anemia: Secondary | ICD-10-CM | POA: Diagnosis present

## 2013-06-21 DIAGNOSIS — G609 Hereditary and idiopathic neuropathy, unspecified: Secondary | ICD-10-CM | POA: Diagnosis present

## 2013-06-21 DIAGNOSIS — Z79899 Other long term (current) drug therapy: Secondary | ICD-10-CM

## 2013-06-21 DIAGNOSIS — E119 Type 2 diabetes mellitus without complications: Secondary | ICD-10-CM | POA: Diagnosis present

## 2013-06-21 DIAGNOSIS — E875 Hyperkalemia: Secondary | ICD-10-CM | POA: Diagnosis not present

## 2013-06-21 DIAGNOSIS — N179 Acute kidney failure, unspecified: Secondary | ICD-10-CM | POA: Diagnosis not present

## 2013-06-21 DIAGNOSIS — Z7901 Long term (current) use of anticoagulants: Secondary | ICD-10-CM

## 2013-06-21 DIAGNOSIS — I739 Peripheral vascular disease, unspecified: Principal | ICD-10-CM | POA: Diagnosis present

## 2013-06-21 DIAGNOSIS — E876 Hypokalemia: Secondary | ICD-10-CM | POA: Diagnosis present

## 2013-06-21 DIAGNOSIS — K922 Gastrointestinal hemorrhage, unspecified: Secondary | ICD-10-CM | POA: Diagnosis present

## 2013-06-21 DIAGNOSIS — K560 Paralytic ileus: Secondary | ICD-10-CM | POA: Diagnosis not present

## 2013-06-21 DIAGNOSIS — I998 Other disorder of circulatory system: Secondary | ICD-10-CM | POA: Diagnosis present

## 2013-06-21 DIAGNOSIS — K921 Melena: Secondary | ICD-10-CM | POA: Diagnosis present

## 2013-06-21 DIAGNOSIS — G547 Phantom limb syndrome without pain: Secondary | ICD-10-CM | POA: Diagnosis not present

## 2013-06-21 DIAGNOSIS — I959 Hypotension, unspecified: Secondary | ICD-10-CM | POA: Diagnosis not present

## 2013-06-21 DIAGNOSIS — K567 Ileus, unspecified: Secondary | ICD-10-CM

## 2013-06-21 DIAGNOSIS — I70269 Atherosclerosis of native arteries of extremities with gangrene, unspecified extremity: Secondary | ICD-10-CM

## 2013-06-21 DIAGNOSIS — F17200 Nicotine dependence, unspecified, uncomplicated: Secondary | ICD-10-CM | POA: Diagnosis present

## 2013-06-21 HISTORY — DX: Type 2 diabetes mellitus without complications: E11.9

## 2013-06-21 HISTORY — PX: ESOPHAGOGASTRODUODENOSCOPY: SHX5428

## 2013-06-21 HISTORY — DX: Peripheral vascular disease, unspecified: I73.9

## 2013-06-21 HISTORY — DX: Essential (primary) hypertension: I10

## 2013-06-21 HISTORY — DX: Polyneuropathy, unspecified: G62.9

## 2013-06-21 LAB — PROTIME-INR
Prothrombin Time: 13.8 secs (ref 11.5–14.7)
Prothrombin Time: 13.3 seconds (ref 11.6–15.2)

## 2013-06-21 LAB — CBC
HCT: 20.8 % — ABNORMAL LOW (ref 36.0–46.0)
HGB: 7.6 g/dL — ABNORMAL LOW (ref 12.0–16.0)
Hemoglobin: 7 g/dL — ABNORMAL LOW (ref 12.0–15.0)
MCH: 28.9 pg (ref 26.0–34.0)
MCHC: 33.5 g/dL (ref 32.0–36.0)
MCV: 85 fL (ref 80–100)
MCV: 86 fL (ref 78.0–100.0)
Platelet: 146 10*3/uL — ABNORMAL LOW (ref 150–440)
Platelets: 139 10*3/uL — ABNORMAL LOW (ref 150–400)
RBC: 2.65 10*6/uL — ABNORMAL LOW (ref 3.80–5.20)
RBC: 2.42 MIL/uL — ABNORMAL LOW (ref 3.87–5.11)
RDW: 18.2 % — ABNORMAL HIGH (ref 11.5–14.5)
WBC: 11.6 10*3/uL — ABNORMAL HIGH (ref 3.6–11.0)
WBC: 9.2 10*3/uL (ref 4.0–10.5)

## 2013-06-21 LAB — ABO/RH: ABO/RH(D): B POS

## 2013-06-21 LAB — COMPREHENSIVE METABOLIC PANEL
AST: 75 U/L — ABNORMAL HIGH (ref 0–37)
Alkaline Phosphatase: 53 U/L (ref 50–136)
Anion Gap: 4 — ABNORMAL LOW (ref 7–16)
BUN: 8 mg/dL (ref 6–23)
Bilirubin,Total: 0.4 mg/dL (ref 0.2–1.0)
CO2: 35 mEq/L — ABNORMAL HIGH (ref 19–32)
Calcium, Total: 8.5 mg/dL (ref 8.5–10.1)
Calcium: 8.5 mg/dL (ref 8.4–10.5)
Chloride: 101 mEq/L (ref 96–112)
Co2: 35 mmol/L — ABNORMAL HIGH (ref 21–32)
Creatinine, Ser: 1.08 mg/dL (ref 0.50–1.10)
EGFR (African American): 56 — ABNORMAL LOW
EGFR (Non-African Amer.): 48 — ABNORMAL LOW
GFR calc Af Amer: 64 mL/min — ABNORMAL LOW (ref >90)
GFR calc non Af Amer: 55 mL/min — ABNORMAL LOW (ref >90)
Glucose, Bld: 98 mg/dL (ref 70–99)
Osmolality: 278 (ref 275–301)
SGOT(AST): 71 U/L — ABNORMAL HIGH (ref 15–37)
Total Bilirubin: 0.4 mg/dL (ref 0.3–1.2)
Total Protein: 5.5 g/dL — ABNORMAL LOW (ref 6.4–8.2)

## 2013-06-21 LAB — GLUCOSE, CAPILLARY
Glucose-Capillary: 91 mg/dL (ref 70–99)
Glucose-Capillary: 88 mg/dL (ref 70–99)
Glucose-Capillary: 145 mg/dL — ABNORMAL HIGH (ref 70–99)
Glucose-Capillary: 204 mg/dL — ABNORMAL HIGH (ref 70–99)

## 2013-06-21 LAB — MRSA PCR SCREENING: MRSA by PCR: POSITIVE — AB

## 2013-06-21 SURGERY — EGD (ESOPHAGOGASTRODUODENOSCOPY)
Anesthesia: Moderate Sedation

## 2013-06-21 MED ORDER — SODIUM CHLORIDE 0.9 % IV SOLN: INTRAVENOUS | Status: DC

## 2013-06-21 MED ORDER — SODIUM CHLORIDE 0.9 % IJ SOLN
3.0000 mL | Freq: Two times a day (BID) | INTRAMUSCULAR | Status: DC
  Administered 2013-06-21 – 2013-06-30 (×12): 3 mL via INTRAVENOUS

## 2013-06-21 MED ORDER — CEFAZOLIN SODIUM 1-5 GM-% IV SOLN
1.0000 g | INTRAVENOUS | Status: AC
  Administered 2013-06-22: 1 g via INTRAVENOUS
  Filled 2013-06-21: qty 50

## 2013-06-21 MED ORDER — MUPIROCIN 2 % EX OINT
1.0000 "application " | TOPICAL_OINTMENT | Freq: Two times a day (BID) | CUTANEOUS | Status: AC
  Administered 2013-06-21 – 2013-06-26 (×9): 1 via NASAL
  Filled 2013-06-21: qty 22

## 2013-06-21 MED ORDER — SODIUM CHLORIDE 0.9 % IV SOLN
INTRAVENOUS | Status: DC
Start: 2013-06-21 — End: 2013-06-23
  Administered 2013-06-21: 1000 mL via INTRAVENOUS
  Administered 2013-06-21: 12:00:00 via INTRAVENOUS

## 2013-06-21 MED ORDER — ACETAMINOPHEN 650 MG RE SUPP: 650.0000 mg | Freq: Once | RECTAL | Status: DC

## 2013-06-21 MED ORDER — INSULIN ASPART 100 UNIT/ML ~~LOC~~ SOLN: 0.0000 [IU] | Freq: Three times a day (TID) | SUBCUTANEOUS | Status: DC

## 2013-06-21 MED ORDER — MORPHINE SULFATE 2 MG/ML IJ SOLN: 1.0000 mg | INTRAMUSCULAR | Status: DC | PRN

## 2013-06-21 MED ORDER — MIDAZOLAM HCL 10 MG/2ML IJ SOLN
INTRAMUSCULAR | Status: DC | PRN
  Administered 2013-06-21: 2 mg via INTRAVENOUS
  Administered 2013-06-21: 1 mg via INTRAVENOUS
  Administered 2013-06-21: 2 mg via INTRAVENOUS

## 2013-06-21 MED ORDER — SODIUM CHLORIDE 0.9 % IV SOLN
8.0000 mg/h | INTRAVENOUS | Status: DC
  Administered 2013-06-21 (×2): 8 mg/h via INTRAVENOUS
  Filled 2013-06-21 (×7): qty 80

## 2013-06-21 MED ORDER — POTASSIUM CHLORIDE 10 MEQ/100ML IV SOLN
10.0000 meq | INTRAVENOUS | Status: AC
  Administered 2013-06-21 (×2): 10 meq via INTRAVENOUS
  Filled 2013-06-21: qty 200

## 2013-06-21 MED ORDER — ONDANSETRON HCL 4 MG/2ML IJ SOLN: 4.0000 mg | Freq: Four times a day (QID) | INTRAMUSCULAR | Status: DC | PRN

## 2013-06-21 MED ORDER — INSULIN ASPART 100 UNIT/ML ~~LOC~~ SOLN
0.0000 [IU] | SUBCUTANEOUS | Status: DC
  Administered 2013-06-21: 1 [IU] via SUBCUTANEOUS
  Administered 2013-06-22 (×2): 2 [IU] via SUBCUTANEOUS
  Administered 2013-06-22: 3 [IU] via SUBCUTANEOUS
  Administered 2013-06-23 (×2): 2 [IU] via SUBCUTANEOUS

## 2013-06-21 MED ORDER — ONDANSETRON HCL 4 MG PO TABS: 4.0000 mg | ORAL_TABLET | Freq: Four times a day (QID) | ORAL | Status: DC | PRN

## 2013-06-21 MED ORDER — BUTAMBEN-TETRACAINE-BENZOCAINE 2-2-14 % EX AERO
INHALATION_SPRAY | CUTANEOUS | Status: DC | PRN
  Administered 2013-06-21: 2 via TOPICAL

## 2013-06-21 MED ORDER — FUROSEMIDE 10 MG/ML IJ SOLN
20.0000 mg | Freq: Once | INTRAMUSCULAR | Status: AC
  Administered 2013-06-21: 20 mg via INTRAVENOUS

## 2013-06-21 MED ORDER — ACETAMINOPHEN 325 MG PO TABS
650.0000 mg | ORAL_TABLET | Freq: Once | ORAL | Status: AC
  Administered 2013-06-21: 650 mg via ORAL
  Filled 2013-06-21: qty 2

## 2013-06-21 MED ORDER — ACETAMINOPHEN 325 MG PO TABS
650.0000 mg | ORAL_TABLET | Freq: Four times a day (QID) | ORAL | Status: DC | PRN
  Administered 2013-06-23 – 2013-06-28 (×6): 650 mg via ORAL
  Filled 2013-06-21 (×6): qty 2
  Filled 2013-06-21: qty 1
  Filled 2013-06-21: qty 2

## 2013-06-21 MED ORDER — CHLORHEXIDINE GLUCONATE CLOTH 2 % EX PADS
6.0000 | MEDICATED_PAD | Freq: Every day | CUTANEOUS | Status: AC
  Administered 2013-06-23 – 2013-06-26 (×4): 6 via TOPICAL

## 2013-06-21 MED ORDER — DIPHENHYDRAMINE HCL 50 MG/ML IJ SOLN
25.0000 mg | Freq: Once | INTRAMUSCULAR | Status: AC
Start: 2013-06-21 — End: 2013-06-21
  Administered 2013-06-21: 25 mg via INTRAVENOUS
  Filled 2013-06-21: qty 1

## 2013-06-21 MED ORDER — ALBUTEROL SULFATE (5 MG/ML) 0.5% IN NEBU: 2.5000 mg | INHALATION_SOLUTION | RESPIRATORY_TRACT | Status: DC | PRN

## 2013-06-21 MED ORDER — ACETAMINOPHEN 650 MG RE SUPP: 650.0000 mg | Freq: Four times a day (QID) | RECTAL | Status: DC | PRN

## 2013-06-21 MED ORDER — ACETAMINOPHEN 325 MG PO TABS: 650.0000 mg | ORAL_TABLET | Freq: Once | ORAL | Status: DC

## 2013-06-21 MED ORDER — FUROSEMIDE 10 MG/ML IJ SOLN
INTRAMUSCULAR | Status: AC
  Filled 2013-06-21: qty 4

## 2013-06-21 MED ORDER — FENTANYL CITRATE 0.05 MG/ML IJ SOLN
INTRAMUSCULAR | Status: DC | PRN
  Administered 2013-06-21: 15 ug via INTRAVENOUS
  Administered 2013-06-21: 25 ug via INTRAVENOUS

## 2013-06-21 MED ORDER — HYDROMORPHONE HCL PF 1 MG/ML IJ SOLN
1.0000 mg | INTRAMUSCULAR | Status: DC | PRN
  Administered 2013-06-21 – 2013-06-22 (×3): 1 mg via INTRAVENOUS
  Filled 2013-06-21 (×4): qty 1

## 2013-06-21 NOTE — H&P (Addendum)
PATIENT DETAILS Name: [name] [name] Age: 57 y.o. Sex: female Date of Birth: 1956-06-25 Admit Date: 06/21/2013 PCP:No primary provider on file.   CHIEF COMPLAINT:  Worsening pain in the right foot since discharge from [facility] on 8/29 Back dark stools for the past 1-2 days  HPI: [name] [name] is a 57 y.o. female with a Past Medical History of severe peripheral vascular disease status post femoropopliteal bypass last year, with a recent history of acute ischemic leg with thrombectomy just 2 days ago at [facility], discharged yesterday and told that if the leg were to become ischemic and patient did require amputation, also on Coumadin and aspirin who presents today with the above noted complaint. Apparently, since discharge from [facility], patient continued to have pain in the right foot area, overnight it worsened, as a result she presented to [facility] emergency room today, where she was found to have cold and pulseless right foot. She also gave a history of black tarry stools for the past 2 days. She was found to have a hemoglobin of 7.6, which was a significant drop from just a few days ago. [facility] hospitalist was contacted by [facility] ED, both vascular surgery and gastroenterology was also consulted, patient was then transferred to the step down unit here for further evaluation and management. On reviewing the records from recent hospitalization at [facility], and also reviewing the operative note from vascular surgery, it looks like the patient's femoropopliteal graft is occluded, and they have attempted to remove clots  2 days ago with limited success. Patient was very told by vascular surgery at [facility], that if the graft again reoccluded, she would require amputation. Patient was aware of this. Patient is now being admitted, vascular surgery and GI consultation are in progress.  ALLERGIES:  Allergies not on file  PAST MEDICAL  HISTORY: Peripheral vascular disease-status post recent him back to me, and femoropopliteal bypass Diabetes Hypertension Peripheral neuropathy  PAST SURGICAL HISTORY: Right femoropopliteal bypass in 2013  MEDICATIONS AT HOME: Prior to Admission medications   Not on File   FAMILY HISTORY: Denies family history of coronary disease  SOCIAL HISTORY: Ongoing tobacco use-approximately a pack Denies Cocaine and alcohol use  REVIEW OF SYSTEMS:  Constitutional:   No  weight loss, night sweats,  Fevers, chills, fatigue.  HEENT:    No headaches, Difficulty swallowing,Tooth/dental problems,Sore throat,  No sneezing, itching, ear ache, nasal congestion, post nasal drip,   Cardio-vascular: No chest pain,  Orthopnea, PND, swelling in lower extremities, anasarca, dizziness, palpitations  GI:  No heartburn, indigestion, abdominal pain, nausea, vomiting, diarrhea, change in  bowel habits, loss of appetite  Resp: No shortness of breath with exertion or at rest.  No excess mucus, no productive cough, No non-productive cough,  No coughing up of blood.No change in color of mucus.No wheezing.No chest wall deformity  Skin:  no rash or lesions.  GU:  no dysuria, change in color of urine, no urgency or frequency.  No flank pain.  Musculoskeletal: No joint pain or swelling.  No decreased range of motion.  No back pain.  Psych: No change in mood or affect. No depression or anxiety.  No memory loss.   PHYSICAL EXAM: Blood pressure 123/61, pulse 93, temperature 98.7 F (37.1 C), temperature source Oral, resp. rate 16, SpO2 99.00%.  General appearance :Awake, alert, not in any distress. Speech Clear. Not toxic Looking HEENT: Atraumatic and Normocephalic, pupils equally reactive to light and accomodation Neck: supple, no JVD. No cervical  lymphadenopathy.  Chest:Good air entry bilaterally, no added sounds  CVS: S1 S2 regular, no murmurs.  Abdomen: Bowel sounds present, Non tender and not  distended with no gaurding, rigidity or rebound. Extremities: B/L Lower Ext shows no edema,. Right leg is cold and pulseless the mid ankle area down. Neurology: Awake alert, and oriented X 3, CN II-XII intact, Non focal Skin:No Rash Wounds:N/A  LABS ON ADMISSION:  No results found for this basename: NA, K, CL, CO2, GLUCOSE, BUN, CREATININE, CALCIUM, MG, PHOS,  in the last 72 hours No results found for this basename: AST, ALT, ALKPHOS, BILITOT, PROT, ALBUMIN,  in the last 72 hours No results found for this basename: LIPASE, AMYLASE,  in the last 72 hours No results found for this basename: WBC, NEUTROABS, HGB, HCT, MCV, PLT,  in the last 72 hours No results found for this basename: CKTOTAL, CKMB, CKMBINDEX, TROPONINI,  in the last 72 hours No results found for this basename: DDIMER,  in the last 72 hours No components found with this basename: POCBNP,    RADIOLOGIC STUDIES ON ADMISSION: No results found.  EKG: Independently reviewed. Normal sinus rhythm  ASSESSMENT AND PLAN: Present on Admission:  . Right Ischemic leg - Patient has already been evaluated by Dr. Edilia Bo from vascular surgery, he has reviewed the recent operative note from vascular surgery at [facility], he has recommended either a BKA or AKA to the patient. After discussion with Dr. Edilia Bo, it is felt that anticoagulation at this time has no benefit given that the patient is felt to require a BKA/AKA anyway, and it carries a risk of worsening ongoing GI bleed.  - Patient at this time wants to over amputation with family, Dr. Edilia Bo will schedule her for the OR on 8/31- via a BKA or AKA.   Marland Kitchen Upper GI bleed - Patient on chronic Coumadin therapy for severe peripheral vascular disease and aspirin, unfortunately along with a right ischemic leg also has active melanotic stools with severe acute blood loss anemia. - consulted GI for endoscopy, will continue with the Protonix infusion - Aspirin obviously will be held, no  anticoagulation at this point-risks outweigh any potential benefits-please see above   . Acute blood loss anemia - We'll go ahead and transfuse 2 units of PRBC currently, monitor hemoglobin and hematocrit closely   . PVD (peripheral vascular disease) - Patient has a long-standing history of severe peripheral vascular disease-requiring numerous thrombectomies and ultimately a femoropopliteal bypass last year. Unfortunately, it looks like the femoropopliteal graft has now become occluded, only a few days ago in [facility], her vascular surgeon attempted to the open this graft but had very limited success. After reviewing the operative note from a vascular surgeon at [facility], it is clearly stated that if the graft were to again close, patient would require a amputation.  - Aspirin, Coumadin, statins on hold as the patient is currently on n.p.o. status for potential EGD later today.   Marland Kitchen HTN (hypertension) - Hold all antihypertensives now, monitor of antihypertensives as the patient has ongoing GI bleed. If blood pressures start to become uncontrolled, we'll use as needed hydralazine   . Diabetes - Hold all her hypoglycemics at this time, place on sliding scale insulin.  Further plan will depend as patient's clinical course evolves and further radiologic and laboratory data become available. Patient will be monitored closely.  DVT Prophylaxis: SCD's  Code Status: Full Code  Total time spent for admission equals 45 minutes.  [facility] Pager [phone]  If 7PM-7AM, please contact night-coverage [email or web address] Password [email or web address] 06/21/2013, 11:57 AM

## 2013-06-21 NOTE — Consult Note (Signed)
  [facility] Consult Note  Referring Provider: No ref. provider found Primary Care Physician:  No primary provider on file. Primary Gastroenterologist:  Dr.  Antony Contras Complaint: Black stools HPI: [name] [name] is an 57 y.o. black female  who presents in transfer from [facility] where she was admitted with leg pain and found to have a femoral artery thrombosis. She was started on Coumadin and began having black stools and has had a 5 g hemoglobin drop and Evan 0.6 over the last 5 days although her BUN is remained normal. She's a bit of a vague historian but denies any nausea vomiting or abdominal pain. Her medical history is sketchy at this point  No past medical history on file.  No past surgical history on file.  No prescriptions prior to admission    Allergies: Allergies not on file  No family history on file.  Social History:  has no tobacco, alcohol, and drug history on file.  Review of Systems: negative except as above   Blood pressure 123/61, pulse 93, temperature 98.7 F (37.1 C), temperature source Oral, resp. rate 16, SpO2 99.00%. Head: Normocephalic, without obvious abnormality, atraumatic Neck: no adenopathy, no carotid bruit, no JVD, supple, symmetrical, trachea midline and thyroid not enlarged, symmetric, no tenderness/mass/nodules Resp: clear to auscultation bilaterally Cardio: regular rate and rhythm, S1, S2 normal, no murmur, click, rub or gallop GI: Abdomen soft nondistended with normoactive bowel sounds. No hepatomegaly masses or guarding Extremities: extremities normal, atraumatic, no cyanosis or edema  No results found for this or any previous visit (from the past 48 hour(s)). No results found.  Assessment: GI bleeding on Coumadin in a patient with a femoral artery thrombosis Plan:  Will plan EGD today to help guide and anticoagulation management. My understanding is that she is scheduled for a below-knee amputation  tomorrow. Sandria Mcenroe C 06/21/2013, 11:54 AM

## 2013-06-21 NOTE — Consult Note (Signed)
Vascular and Vein Specialist of [facility]  Patient name: [name] [name] MRN: [id number] DOB: June 28, 1956 Sex: female  REASON FOR CONSULT: ischemic right lower extremity.  HPI: [name] [name] is a 57 y.o. female who states that she had a right femoral to popliteal artery bypass graft in [place] in July of 2013. She tells me that the graft has clotted 4-5 times since this time. Most recently on 06/19/2013 she underwent thrombolysis of her occluded right femoropopliteal bypass graft. I do not have records prior to this. According to the most recent operative report, the vascular surgeon in [place] felt that it was unlikely that the graft could be salvaged however the patient felt strongly about attempting limb salvage. She had presented with paresthesias of her right foot in an ischemic right foot.  I reviewed the notes from the procedure. After thrombolysis, air was residual clot in the distal bypass graft and also residual thrombus in the tibial vessels with very poor runoff. She had angioplasty of the posterior tibial artery throughout its course and mechanical thrombectomy. At the completion of the procedure the runoff was noted to be very poor. It was noted that if this fails she would require amputation.  Her original presenting symptoms were claudication rest pain of her right foot. She currently has no sensation in her right foot in moderate discomfort.  PAST MEDICAL HISTORY: She states that she has diabetes, hypertension, and hypercholesterolemia. She denies any history of myocardial infarction or history of congestive heart failure.  FAMILY HISTORY: She is unaware of any history of premature cardiovascular disease.  SOCIAL HISTORY: She smokes half a pack per day. She has been smoking for 30 years. She is single. She has 3 children.  Current Facility-Administered Medications  Medication Dose Route Frequency Provider Last Rate Last Dose  . 0.9 %  sodium chloride infusion    Intravenous Continuous Shanker Levora Dredge, MD      . acetaminophen (TYLENOL) tablet 650 mg  650 mg Oral Q6H PRN Shanker Levora Dredge, MD       Or  . acetaminophen (TYLENOL) suppository 650 mg  650 mg Rectal Q6H PRN Shanker Levora Dredge, MD      . acetaminophen (TYLENOL) tablet 650 mg  650 mg Oral Once Shanker Levora Dredge, MD      . albuterol (PROVENTIL) (5 MG/ML) 0.5% nebulizer solution 2.5 mg  2.5 mg Nebulization Q2H PRN Shanker Levora Dredge, MD      . diphenhydrAMINE (BENADRYL) injection 25 mg  25 mg Intravenous Once Shanker Levora Dredge, MD      . furosemide (LASIX) injection 20 mg  20 mg Intravenous Once Shanker Levora Dredge, MD      . HYDROmorphone (DILAUDID) injection 1 mg  1 mg Intravenous Q3H PRN Shanker Levora Dredge, MD      . insulin aspart (novoLOG) injection 0-9 Units  0-9 Units Subcutaneous TID WC Shanker Levora Dredge, MD      . ondansetron (ZOFRAN) tablet 4 mg  4 mg Oral Q6H PRN Shanker Levora Dredge, MD       Or  . ondansetron (ZOFRAN) injection 4 mg  4 mg Intravenous Q6H PRN Shanker Levora Dredge, MD      . pantoprazole (PROTONIX) 80 mg in sodium chloride 0.9 % 250 mL infusion  8 mg/hr Intravenous Continuous Shanker Levora Dredge, MD      . sodium chloride 0.9 % injection 3 mL  3 mL Intravenous Q12H Shanker Levora Dredge, MD       REVIEW OF  SYSTEMS: Arly.Keller ] denotes positive finding; [  ] denotes negative finding CARDIOVASCULAR:  [ ]  chest pain   [ ]  chest pressure   [ ]  palpitations   [ ]  orthopnea   [ ]  dyspnea on exertion   [ ]  claudication   [ ]  rest pain   [ ]  DVT   [ ]  phlebitis PULMONARY:   [ ]  productive cough   [ ]  asthma   [ ]  wheezing NEUROLOGIC:   [ ]  weakness  [ ]  paresthesias  [ ]  aphasia  [ ]  amaurosis  [ ]  dizziness HEMATOLOGIC:   [ ]  bleeding problems   [ ]  clotting disorders MUSCULOSKELETAL:  [ ]  joint pain   [ ]  joint swelling [ ]  leg swelling GASTROINTESTINAL: Arly.Keller ]  blood in stool- melana for 2 days   [ ]   hematemesis GENITOURINARY:  [ ]   dysuria  [ ]   hematuria PSYCHIATRIC:  [ ]  history of major  depression INTEGUMENTARY:  [ ]  rashes  [ ]  ulcers CONSTITUTIONAL:  [ ]  fever   [ ]  chills  PHYSICAL EXAM: Filed Vitals:   06/21/13 1050  BP: 123/61  Pulse: 93  Temp: 98.7 F (37.1 C)  TempSrc: Oral  Resp: 16  SpO2: 99%   There is no height or weight on file to calculate BMI. GENERAL: The patient is a well-nourished female, in no acute distress. The vital signs are documented above. CARDIOVASCULAR: There is a regular rate and rhythm. I do not detect carotid bruits. She has palpable femoral pulses. On the right side I cannot palpate popliteal or pedal pulses. There is no Doppler flow in the right foot. On the left side she has a palpable popliteal pulse and dorsalis pedis pulse. PULMONARY: There is good air exchange bilaterally without wheezing or rales. ABDOMEN: Soft and non-tender with normal pitched bowel sounds.  MUSCULOSKELETAL: she has significant calf tenderness on the right. NEUROLOGIC:She is unable to move her right foot. She has no sensation in the right foot. The right foot is cool SKIN: There are no ulcers or rashes noted. PSYCHIATRIC: The patient has a normal affect.  DATA:  Labs from the referring hospital show a hemoglobin of 7.6 today. INR was 1.3 yesterday. Creatinine is 1.23.  I do not have access to any of her arteriograms were done in [place].  MEDICAL ISSUES: This patient presents with a profoundly ischemic right foot which I do not think is salvageable. In addition, she has had multiple previous attempts at limb salvage by her vascular surgeons in [place]. He was noted on the op note from 2 days ago that there were no further options for revascularization given her severe tibial artery occlusive disease. Even if she were a candidate for revascularization, I think the leg has been ischemic for a long enough that this is not salvageable. Given her calf tenderness, she may not be able to heal a below the knee amputation and potentially require above-the-knee  amputation. I've explained that if she is willing to proceed with surgery I could explore the muscle below the knee to see if there is any chance of performing her amputation below the knee. If not we would perform the amputation above the knee. We have discussed the risks of the procedure and the potential complications including the risk of nonhealing. She would like to discuss this with her family before agreeing to proceed. In addition she is to be evaluated by GI to workup her melena and anemia. I willing to  proceed with amputation tomorrow pending the results of her GI evaluation and if the patient is agreeable. She is on Coumadin which is being held.  DICKSON,CHRISTOPHER S [facility] Vein Specialists of [place] [facility]: [phone]

## 2013-06-21 NOTE — Op Note (Signed)
[facility] [address] [place], [address]   ENDOSCOPY PROCEDURE REPORT  PATIENT: [name] [name], [name] [name]  MR#: [id number] BIRTHDATE: 20-Oct-1956 , 57  yrs. old GENDER: Female ENDOSCOPIST:Tinna Kolker Madilyn Fireman, MD REFERRED BY: PROCEDURE DATE:  06/21/2013 PROCEDURE: ASA CLASS: INDICATIONS:   melena and anemia MEDICATION:    fentanyl 75 mcg, Versed 5 mg TOPICAL ANESTHETIC:  DESCRIPTION OF PROCEDURE:   esophagus: Patent lower esophageal ring otherwise normal Stomach normal Duodenum minimal duodenitis no ulcer or other stigmata of hemorrhage     COMPLICATIONS: None  ENDOSCOPIC IMPRESSION:no source of significant GI bleeding no blood in stomach or duodenum.  RECOMMENDATIONS:continue to monitor stools and hemoglobin consider colonoscopy if appropriate    _______________________________ eSigned:  Dorena Cookey, MD 06/21/2013 1:57 PM

## 2013-06-22 ENCOUNTER — Encounter (HOSPITAL_COMMUNITY): Payer: Self-pay | Admitting: Anesthesiology

## 2013-06-22 ENCOUNTER — Inpatient Hospital Stay (HOSPITAL_COMMUNITY): Payer: PRIVATE HEALTH INSURANCE | Admitting: Anesthesiology

## 2013-06-22 ENCOUNTER — Encounter (HOSPITAL_COMMUNITY)
Admission: AD | Disposition: A | Discharge status: Rehabilitation Facility | Payer: Self-pay | Source: Other Acute Inpatient Hospital | Attending: Internal Medicine

## 2013-06-22 ENCOUNTER — Encounter (HOSPITAL_COMMUNITY): Payer: Self-pay | Admitting: Family Medicine

## 2013-06-22 HISTORY — PX: AMPUTATION: SHX166

## 2013-06-22 LAB — CBC
HCT: 27.2 % — ABNORMAL LOW (ref 36.0–46.0)
HCT: 25.8 % — ABNORMAL LOW (ref 36.0–46.0)
Hemoglobin: 8.7 g/dL — ABNORMAL LOW (ref 12.0–15.0)
MCHC: 33.5 g/dL (ref 30.0–36.0)
MCHC: 33.7 g/dL (ref 30.0–36.0)
MCV: 83.7 fL (ref 78.0–100.0)
RBC: 3.04 MIL/uL — ABNORMAL LOW (ref 3.87–5.11)
RDW: 17 % — ABNORMAL HIGH (ref 11.5–15.5)
WBC: 7.8 10*3/uL (ref 4.0–10.5)

## 2013-06-22 LAB — GLUCOSE, CAPILLARY: Glucose-Capillary: 188 mg/dL — ABNORMAL HIGH (ref 70–99)

## 2013-06-22 LAB — COMPREHENSIVE METABOLIC PANEL
ALT: 32 U/L (ref 0–35)
Alkaline Phosphatase: 38 U/L — ABNORMAL LOW (ref 39–117)
Chloride: 102 mEq/L (ref 96–112)
GFR calc Af Amer: 67 mL/min — ABNORMAL LOW (ref >90)
Glucose, Bld: 67 mg/dL — ABNORMAL LOW (ref 70–99)
Potassium: 3.5 mEq/L (ref 3.5–5.1)
Sodium: 141 mEq/L (ref 135–145)
Total Bilirubin: 0.4 mg/dL (ref 0.3–1.2)
Total Protein: 5.3 g/dL — ABNORMAL LOW (ref 6.0–8.3)

## 2013-06-22 LAB — CREATININE, SERUM
GFR calc Af Amer: 75 mL/min — ABNORMAL LOW (ref >90)
GFR calc non Af Amer: 65 mL/min — ABNORMAL LOW (ref >90)

## 2013-06-22 SURGERY — AMPUTATION BELOW KNEE
Anesthesia: General | Site: Leg Lower | Laterality: Right | Wound class: Clean

## 2013-06-22 MED ORDER — POTASSIUM CHLORIDE CRYS ER 20 MEQ PO TBCR: 20.0000 meq | EXTENDED_RELEASE_TABLET | Freq: Once | ORAL | Status: AC | PRN

## 2013-06-22 MED ORDER — MORPHINE SULFATE (PF) 1 MG/ML IV SOLN
INTRAVENOUS | Status: AC
  Filled 2013-06-22: qty 25

## 2013-06-22 MED ORDER — PREGABALIN 75 MG PO CAPS
300.0000 mg | ORAL_CAPSULE | Freq: Two times a day (BID) | ORAL | Status: DC
  Administered 2013-06-22 – 2013-07-01 (×18): 300 mg via ORAL
  Filled 2013-06-22 (×2): qty 4
  Filled 2013-06-22: qty 6
  Filled 2013-06-22: qty 4
  Filled 2013-06-22: qty 2
  Filled 2013-06-22: qty 3
  Filled 2013-06-22: qty 12
  Filled 2013-06-22: qty 4
  Filled 2013-06-22: qty 3
  Filled 2013-06-22 (×2): qty 4
  Filled 2013-06-22: qty 3
  Filled 2013-06-22: qty 4
  Filled 2013-06-22: qty 2
  Filled 2013-06-22: qty 3
  Filled 2013-06-22: qty 4
  Filled 2013-06-22 (×2): qty 3
  Filled 2013-06-22: qty 4
  Filled 2013-06-22: qty 2
  Filled 2013-06-22: qty 3

## 2013-06-22 MED ORDER — LABETALOL HCL 5 MG/ML IV SOLN: 10.0000 mg | INTRAVENOUS | Status: DC | PRN

## 2013-06-22 MED ORDER — OXYCODONE HCL 5 MG PO TABS: 5.0000 mg | ORAL_TABLET | Freq: Once | ORAL | Status: DC | PRN

## 2013-06-22 MED ORDER — PHENOL 1.4 % MT LIQD: 1.0000 | OROMUCOSAL | Status: DC | PRN

## 2013-06-22 MED ORDER — WARFARIN SODIUM 4 MG PO TABS
4.0000 mg | ORAL_TABLET | Freq: Every day | ORAL | Status: DC
  Administered 2013-06-22 – 2013-06-23 (×2): 4 mg via ORAL
  Filled 2013-06-22 (×3): qty 1

## 2013-06-22 MED ORDER — PHENYLEPHRINE HCL 10 MG/ML IJ SOLN
INTRAMUSCULAR | Status: DC | PRN
  Administered 2013-06-22 (×4): 80 ug via INTRAVENOUS

## 2013-06-22 MED ORDER — HYDROMORPHONE HCL PF 1 MG/ML IJ SOLN
1.0000 mg | INTRAMUSCULAR | Status: DC | PRN
  Administered 2013-06-22 (×2): 1 mg via INTRAVENOUS
  Filled 2013-06-22: qty 1

## 2013-06-22 MED ORDER — LABETALOL HCL 5 MG/ML IV SOLN
INTRAVENOUS | Status: DC | PRN
  Administered 2013-06-22: 10 mg via INTRAVENOUS

## 2013-06-22 MED ORDER — IRBESARTAN 300 MG PO TABS
300.0000 mg | ORAL_TABLET | Freq: Every day | ORAL | Status: DC
  Administered 2013-06-22 – 2013-06-23 (×2): 300 mg via ORAL
  Filled 2013-06-22 (×2): qty 1

## 2013-06-22 MED ORDER — DULOXETINE HCL 60 MG PO CPEP
60.0000 mg | ORAL_CAPSULE | Freq: Every day | ORAL | Status: DC
  Administered 2013-06-22 – 2013-07-01 (×10): 60 mg via ORAL
  Filled 2013-06-22 (×10): qty 1

## 2013-06-22 MED ORDER — LACTATED RINGERS IV SOLN
INTRAVENOUS | Status: DC | PRN
  Administered 2013-06-22: 09:00:00 via INTRAVENOUS

## 2013-06-22 MED ORDER — GUAIFENESIN-DM 100-10 MG/5ML PO SYRP: 15.0000 mL | ORAL_SOLUTION | ORAL | Status: DC | PRN

## 2013-06-22 MED ORDER — WARFARIN - PHARMACIST DOSING INPATIENT
Freq: Every day | Status: DC
  Administered 2013-06-27: 18:00:00

## 2013-06-22 MED ORDER — LIDOCAINE HCL (CARDIAC) 20 MG/ML IV SOLN
INTRAVENOUS | Status: DC | PRN
  Administered 2013-06-22: 100 mg via INTRAVENOUS

## 2013-06-22 MED ORDER — ARTIFICIAL TEARS OP OINT
TOPICAL_OINTMENT | OPHTHALMIC | Status: DC | PRN
  Administered 2013-06-22: 1 via OPHTHALMIC

## 2013-06-22 MED ORDER — METOPROLOL TARTRATE 1 MG/ML IV SOLN: 2.0000 mg | INTRAVENOUS | Status: DC | PRN

## 2013-06-22 MED ORDER — NALOXONE HCL 0.4 MG/ML IJ SOLN: 0.4000 mg | INTRAMUSCULAR | Status: DC | PRN

## 2013-06-22 MED ORDER — BACITRACIN ZINC 500 UNIT/GM EX OINT
TOPICAL_OINTMENT | CUTANEOUS | Status: DC | PRN
  Administered 2013-06-22: 1 via TOPICAL

## 2013-06-22 MED ORDER — DIPHENHYDRAMINE HCL 50 MG/ML IJ SOLN: 12.5000 mg | Freq: Four times a day (QID) | INTRAMUSCULAR | Status: DC | PRN

## 2013-06-22 MED ORDER — SODIUM CHLORIDE 0.9 % IJ SOLN: 9.0000 mL | INTRAMUSCULAR | Status: DC | PRN

## 2013-06-22 MED ORDER — GLYCOPYRROLATE 0.2 MG/ML IJ SOLN
INTRAMUSCULAR | Status: DC | PRN
  Administered 2013-06-22: 0.6 mg via INTRAVENOUS

## 2013-06-22 MED ORDER — AMITRIPTYLINE HCL 50 MG PO TABS
50.0000 mg | ORAL_TABLET | Freq: Every day | ORAL | Status: DC
  Administered 2013-06-22 – 2013-06-30 (×9): 50 mg via ORAL
  Filled 2013-06-22 (×13): qty 1

## 2013-06-22 MED ORDER — KCL IN DEXTROSE-NACL 20-5-0.45 MEQ/L-%-% IV SOLN
INTRAVENOUS | Status: DC
  Administered 2013-06-22: 18:00:00 via INTRAVENOUS
  Filled 2013-06-22 (×6): qty 1000

## 2013-06-22 MED ORDER — CEFAZOLIN SODIUM 1-5 GM-% IV SOLN
INTRAVENOUS | Status: AC
  Administered 2013-06-22: 2 g via INTRAVENOUS
  Filled 2013-06-22: qty 50

## 2013-06-22 MED ORDER — DIPHENHYDRAMINE HCL 12.5 MG/5ML PO ELIX
12.5000 mg | ORAL_SOLUTION | Freq: Four times a day (QID) | ORAL | Status: DC | PRN
  Filled 2013-06-22: qty 5

## 2013-06-22 MED ORDER — ALBUMIN HUMAN 5 % IV SOLN
INTRAVENOUS | Status: DC | PRN
  Administered 2013-06-22: 10:00:00 via INTRAVENOUS

## 2013-06-22 MED ORDER — HYDROMORPHONE HCL PF 1 MG/ML IJ SOLN: 0.2500 mg | INTRAMUSCULAR | Status: DC | PRN

## 2013-06-22 MED ORDER — WARFARIN - PHYSICIAN DOSING INPATIENT: Freq: Every day | Status: DC

## 2013-06-22 MED ORDER — OXYCODONE HCL 5 MG/5ML PO SOLN: 5.0000 mg | Freq: Once | ORAL | Status: DC | PRN

## 2013-06-22 MED ORDER — PANTOPRAZOLE SODIUM 40 MG PO TBEC
40.0000 mg | DELAYED_RELEASE_TABLET | Freq: Two times a day (BID) | ORAL | Status: DC
  Administered 2013-06-22 – 2013-07-01 (×18): 40 mg via ORAL
  Filled 2013-06-22 (×19): qty 1

## 2013-06-22 MED ORDER — ATORVASTATIN CALCIUM 40 MG PO TABS
40.0000 mg | ORAL_TABLET | Freq: Every day | ORAL | Status: DC
  Administered 2013-06-22 – 2013-06-30 (×9): 40 mg via ORAL
  Filled 2013-06-22 (×12): qty 1

## 2013-06-22 MED ORDER — BACITRACIN ZINC 500 UNIT/GM EX OINT
TOPICAL_OINTMENT | CUTANEOUS | Status: AC
  Filled 2013-06-22: qty 15

## 2013-06-22 MED ORDER — LINAGLIPTIN 5 MG PO TABS
5.0000 mg | ORAL_TABLET | Freq: Every day | ORAL | Status: DC
  Administered 2013-06-22: 5 mg via ORAL
  Filled 2013-06-22: qty 1

## 2013-06-22 MED ORDER — DOCUSATE SODIUM 100 MG PO CAPS
100.0000 mg | ORAL_CAPSULE | Freq: Every day | ORAL | Status: DC
  Administered 2013-06-23 – 2013-07-01 (×9): 100 mg via ORAL
  Filled 2013-06-22 (×9): qty 1

## 2013-06-22 MED ORDER — MAGNESIUM SULFATE 40 MG/ML IJ SOLN
2.0000 g | Freq: Once | INTRAMUSCULAR | Status: AC | PRN
  Filled 2013-06-22: qty 50

## 2013-06-22 MED ORDER — CHLORTHALIDONE 25 MG PO TABS
12.5000 mg | ORAL_TABLET | Freq: Every day | ORAL | Status: DC
  Administered 2013-06-22: 14:00:00 via ORAL
  Filled 2013-06-22: qty 0.5

## 2013-06-22 MED ORDER — 0.9 % SODIUM CHLORIDE (POUR BTL) OPTIME
TOPICAL | Status: DC | PRN
  Administered 2013-06-22: 1000 mL

## 2013-06-22 MED ORDER — NEOSTIGMINE METHYLSULFATE 1 MG/ML IJ SOLN
INTRAMUSCULAR | Status: DC | PRN
  Administered 2013-06-22: 4 mg via INTRAVENOUS

## 2013-06-22 MED ORDER — ALUM & MAG HYDROXIDE-SIMETH 200-200-20 MG/5ML PO SUSP: 15.0000 mL | ORAL | Status: DC | PRN

## 2013-06-22 MED ORDER — FENTANYL CITRATE 0.05 MG/ML IJ SOLN
INTRAMUSCULAR | Status: DC | PRN
  Administered 2013-06-22: 50 ug via INTRAVENOUS
  Administered 2013-06-22 (×2): 100 ug via INTRAVENOUS

## 2013-06-22 MED ORDER — MIDAZOLAM HCL 5 MG/5ML IJ SOLN
INTRAMUSCULAR | Status: DC | PRN
  Administered 2013-06-22: 2 mg via INTRAVENOUS

## 2013-06-22 MED ORDER — AZILSARTAN-CHLORTHALIDONE 40-12.5 MG PO TABS
1.0000 | ORAL_TABLET | Freq: Every day | ORAL | Status: DC
Start: 2013-06-22 — End: 2013-06-22

## 2013-06-22 MED ORDER — MORPHINE SULFATE (PF) 1 MG/ML IV SOLN
INTRAVENOUS | Status: DC
Start: 2013-06-22 — End: 2013-06-23
  Administered 2013-06-22 (×2): via INTRAVENOUS
  Administered 2013-06-22: 6 mg via INTRAVENOUS
  Administered 2013-06-22: 3 mg via INTRAVENOUS
  Administered 2013-06-23: 18 mg via INTRAVENOUS
  Administered 2013-06-23: 4.3 mg via INTRAVENOUS
  Administered 2013-06-23: 17:00:00 via INTRAVENOUS
  Administered 2013-06-23: 7.5 mg via INTRAVENOUS
  Filled 2013-06-22 (×4): qty 25

## 2013-06-22 MED ORDER — ONDANSETRON HCL 4 MG/2ML IJ SOLN
INTRAMUSCULAR | Status: DC | PRN
  Administered 2013-06-22: 4 mg via INTRAVENOUS

## 2013-06-22 MED ORDER — PROPOFOL 10 MG/ML IV BOLUS
INTRAVENOUS | Status: DC | PRN
  Administered 2013-06-22: 150 mg via INTRAVENOUS
  Administered 2013-06-22: 50 mg via INTRAVENOUS

## 2013-06-22 MED ORDER — HYDRALAZINE HCL 20 MG/ML IJ SOLN: 10.0000 mg | INTRAMUSCULAR | Status: DC | PRN

## 2013-06-22 MED ORDER — OXYCODONE-ACETAMINOPHEN 5-325 MG PO TABS
1.0000 | ORAL_TABLET | ORAL | Status: DC | PRN
  Administered 2013-06-22 – 2013-06-23 (×3): 1 via ORAL
  Filled 2013-06-22 (×3): qty 1

## 2013-06-22 MED ORDER — ENOXAPARIN SODIUM 40 MG/0.4ML ~~LOC~~ SOLN
40.0000 mg | SUBCUTANEOUS | Status: DC
  Administered 2013-06-23 – 2013-06-27 (×5): 40 mg via SUBCUTANEOUS
  Filled 2013-06-22 (×7): qty 0.4

## 2013-06-22 MED ORDER — ALLOPURINOL 100 MG PO TABS
100.0000 mg | ORAL_TABLET | Freq: Every day | ORAL | Status: DC
  Administered 2013-06-22 – 2013-07-01 (×10): 100 mg via ORAL
  Filled 2013-06-22 (×10): qty 1

## 2013-06-22 MED ORDER — ROCURONIUM BROMIDE 100 MG/10ML IV SOLN
INTRAVENOUS | Status: DC | PRN
  Administered 2013-06-22: 40 mg via INTRAVENOUS

## 2013-06-22 SURGICAL SUPPLY — 48 items
BANDAGE ELASTIC 4 VELCRO ST LF (GAUZE/BANDAGES/DRESSINGS) ×2 IMPLANT
BANDAGE ELASTIC 6 VELCRO ST LF (GAUZE/BANDAGES/DRESSINGS) IMPLANT
BANDAGE ESMARK 6X9 LF (GAUZE/BANDAGES/DRESSINGS) ×1 IMPLANT
BANDAGE GAUZE ELAST BULKY 4 IN (GAUZE/BANDAGES/DRESSINGS) ×2 IMPLANT
BLADE SAW RECIP 87.9 MT (BLADE) ×2 IMPLANT
BNDG COHESIVE 6X5 TAN STRL LF (GAUZE/BANDAGES/DRESSINGS) ×4 IMPLANT
BNDG ESMARK 6X9 LF (GAUZE/BANDAGES/DRESSINGS) ×2
CANISTER SUCTION 2500CC (MISCELLANEOUS) ×2 IMPLANT
CLIP TI MEDIUM 6 (CLIP) IMPLANT
CLOTH BEACON ORANGE TIMEOUT ST (SAFETY) ×2 IMPLANT
COVER SURGICAL LIGHT HANDLE (MISCELLANEOUS) ×2 IMPLANT
CUFF TOURNIQUET SINGLE 24IN (TOURNIQUET CUFF) IMPLANT
CUFF TOURNIQUET SINGLE 34IN LL (TOURNIQUET CUFF) ×2 IMPLANT
CUFF TOURNIQUET SINGLE 44IN (TOURNIQUET CUFF) IMPLANT
DRAIN CHANNEL 19F RND (DRAIN) IMPLANT
DRAPE ORTHO SPLIT 77X108 STRL (DRAPES) ×2
DRAPE PROXIMA HALF (DRAPES) ×2 IMPLANT
DRAPE SURG ORHT 6 SPLT 77X108 (DRAPES) ×2 IMPLANT
DRAPE U-SHAPE 47X51 STRL (DRAPES) ×2 IMPLANT
DRSG ADAPTIC 3X8 NADH LF (GAUZE/BANDAGES/DRESSINGS) ×2 IMPLANT
ELECT REM PT RETURN 9FT ADLT (ELECTROSURGICAL) ×2
ELECTRODE REM PT RTRN 9FT ADLT (ELECTROSURGICAL) ×1 IMPLANT
EVACUATOR SILICONE 100CC (DRAIN) IMPLANT
GLOVE BIO SURGEON STRL SZ7.5 (GLOVE) ×2 IMPLANT
GLOVE BIOGEL PI IND STRL 8 (GLOVE) ×1 IMPLANT
GLOVE BIOGEL PI INDICATOR 8 (GLOVE) ×1
GOWN STRL NON-REIN LRG LVL3 (GOWN DISPOSABLE) ×6 IMPLANT
KIT BASIN OR (CUSTOM PROCEDURE TRAY) ×2 IMPLANT
KIT ROOM TURNOVER OR (KITS) ×2 IMPLANT
NS IRRIG 1000ML POUR BTL (IV SOLUTION) ×2 IMPLANT
PACK GENERAL/GYN (CUSTOM PROCEDURE TRAY) ×2 IMPLANT
PAD ARMBOARD 7.5X6 YLW CONV (MISCELLANEOUS) ×4 IMPLANT
PADDING CAST COTTON 6X4 STRL (CAST SUPPLIES) ×2 IMPLANT
SPONGE GAUZE 4X4 12PLY (GAUZE/BANDAGES/DRESSINGS) ×2 IMPLANT
STAPLER VISISTAT 35W (STAPLE) ×2 IMPLANT
STOCKINETTE IMPERVIOUS LG (DRAPES) ×2 IMPLANT
SUT ETHILON 3 0 PS 1 (SUTURE) IMPLANT
SUT SILK 0 TIES 10X30 (SUTURE) ×2 IMPLANT
SUT SILK 2 0 (SUTURE) ×1
SUT SILK 2 0 SH CR/8 (SUTURE) ×2 IMPLANT
SUT SILK 2-0 18XBRD TIE 12 (SUTURE) ×1 IMPLANT
SUT SILK 3 0 (SUTURE) ×1
SUT SILK 3-0 18XBRD TIE 12 (SUTURE) ×1 IMPLANT
SUT VIC AB 2-0 CT1 18 (SUTURE) ×2 IMPLANT
TOWEL OR 17X24 6PK STRL BLUE (TOWEL DISPOSABLE) ×4 IMPLANT
TOWEL OR 17X26 10 PK STRL BLUE (TOWEL DISPOSABLE) ×2 IMPLANT
UNDERPAD 30X30 INCONTINENT (UNDERPADS AND DIAPERS) ×2 IMPLANT
WATER STERILE IRR 1000ML POUR (IV SOLUTION) IMPLANT

## 2013-06-22 NOTE — Progress Notes (Signed)
PATIENT DETAILS Name: [name] [name] Age: 57 y.o. Sex: female Date of Birth: 24-Oct-1955 Admit Date: 06/21/2013 Admitting Physician Leroy Sea, MD PCP:No primary provider on file.  Subjective: Continues to have right leg and foot pain. Right foot still very cold  Assessment/Plan: Principal Problem:   Ischemic leg-Right -known hx of PVD-is s/p femoropopliteal bypass-and recent thrombectomy for the occluded graft on 8/28 at [facility]. It was noted that if this fails she would require amputation.Patient was admitted on 8/30 for worsening right leg pain. She had a pulseless and cold right foot, she was evaluated by [facility]-Dr [place] felt that the leg was unsalvageable, and patient would require either a BKA/AKA. This is planned for today. -She unfortunately also had a concurrent UGI bleed with acute blood loss anemia, therefore was not started on IV Heparin after weighing risk vs benefits. -she has known PVD-may need anti-platelet agents/coumadin re-initiated once ok with GI  Active Problems: Upper GI bleed  - Patient on chronic Coumadin therapy for severe peripheral vascular disease and aspirin, unfortunately along with a right ischemic leg also has active melanotic stools with severe acute blood loss anemia. -EGD-on 8/30-no foci to explain melanotic stools -c/w PPI-await further GI recommendations  Acute blood loss anemia -Hb stable after 2 units of PRBC transfusion -monitor periodically  Hx of PVD (peripheral vascular disease) -Patient has a long-standing history of severe peripheral vascular disease-requiring numerous thrombectomies and ultimately a femoropopliteal bypass last year. Unfortunately, it looks like the femoropopliteal graft has now become occluded, only a few days ago in [facility], her vascular surgeon attempted to the open this graft but had very limited success. After reviewing the operative note from a vascular surgeon at [facility], it is clearly  stated that if the graft were to again close, patient would require a amputation. For amputation 8/31  HTN -currently off all anti-hypertensives-BP slowly creeping up-restart when able-currently NPO and going to the OR  Diabetes -CBG stable with SSI  Disposition: Remain inpatient  DVT Prophylaxis:  SCD's  Code Status: Full code  Family Communication None at bedside  Procedures:  EGD 8/30   CONSULTS:  GI and vascular surgery   MEDICATIONS: Scheduled Meds: . Chlorhexidine Gluconate Cloth  6 each Topical Q0600  . insulin aspart  0-9 Units Subcutaneous Q4H  . mupirocin ointment  1 application Nasal BID  . sodium chloride  3 mL Intravenous Q12H   Continuous Infusions: . sodium chloride 1,000 mL (06/21/13 2201)  . pantoprozole (PROTONIX) infusion 8 mg/hr (06/21/13 2201)   PRN Meds:.acetaminophen, acetaminophen, albuterol, HYDROmorphone (DILAUDID) injection, ondansetron (ZOFRAN) IV, ondansetron  Antibiotics: Anti-infectives   Start     Dose/Rate Route Frequency Ordered Stop   06/22/13 0600  ceFAZolin (ANCEF) IVPB 1 g/50 mL premix    Comments:  Send with pt to OR   1 g 100 mL/hr over 30 Minutes Intravenous On call 06/21/13 1743 06/22/13 0702       PHYSICAL EXAM: Vital signs in last 24 hours: Filed Vitals:   06/21/13 2257 06/21/13 2345 06/22/13 0308 06/22/13 0734  BP: 132/65 136/73 156/74 144/78  Pulse: 80 82 79   Temp: 98.5 F (36.9 C) 98.3 F (36.8 C) 98.1 F (36.7 C) 98.1 F (36.7 C)  TempSrc: Oral Oral Oral Oral  Resp: 21 18 21 19   Height:      Weight:   70.9 kg (156 lb 4.9 oz)   SpO2:  98% 99% 99%    Weight change:  Filed Weights   06/21/13 1050  06/22/13 0308  Weight: 72.9 kg (160 lb 11.5 oz) 70.9 kg (156 lb 4.9 oz)   Body mass index is 25.24 kg/(m^2).   Gen Exam: Awake and alert with clear speech.   Neck: Supple, No JVD.   Chest: B/L Clear.   CVS: S1 S2 Regular, no murmurs.  Abdomen: soft, BS +, non tender, non distended.  Extremities:  no edema, Right leg cold from ankle downwards, calf area is tender. Neurologic: Non Focal.  Skin: No Rash.  Wounds: N/A.    Intake/Output from previous day:  Intake/Output Summary (Last 24 hours) at 06/22/13 0804 Last data filed at 06/22/13 0400  Gross per 24 hour  Intake 2102.5 ml  Output   3025 ml  Net -922.5 ml     LAB RESULTS: CBC  Recent Labs Lab 06/21/13 1520 06/22/13 0135  WBC 9.2 8.1  HGB 7.0* 9.1*  HCT 20.8* 27.2*  PLT 139* 147*  MCV 86.0 83.7  MCH 28.9 28.0  MCHC 33.7 33.5  RDW 17.6* 17.0*    Chemistries   Recent Labs Lab 06/21/13 1520  NA 142  K 3.4*  CL 101  CO2 35*  GLUCOSE 98  BUN 8  CREATININE 1.08  CALCIUM 8.5    CBG:  Recent Labs Lab 06/21/13 1431 06/21/13 1600 06/21/13 1923 06/21/13 2317 06/22/13 0306  GLUCAP 91 88 145* 204* 83    GFR Estimated Creatinine Clearance: 53.2 ml/min (by C-G formula based on Cr of 1.08).  Coagulation profile  Recent Labs Lab 06/21/13 1520  INR 1.03    Cardiac Enzymes No results found for this basename: CK, CKMB, TROPONINI, MYOGLOBIN,  in the last 168 hours  No components found with this basename: POCBNP,  No results found for this basename: DDIMER,  in the last 72 hours No results found for this basename: HGBA1C,  in the last 72 hours No results found for this basename: CHOL, HDL, LDLCALC, TRIG, CHOLHDL, LDLDIRECT,  in the last 72 hours No results found for this basename: TSH, T4TOTAL, FREET3, T3FREE, THYROIDAB,  in the last 72 hours No results found for this basename: VITAMINB12, FOLATE, FERRITIN, TIBC, IRON, RETICCTPCT,  in the last 72 hours No results found for this basename: LIPASE, AMYLASE,  in the last 72 hours  Urine Studies No results found for this basename: UACOL, UAPR, USPG, UPH, UTP, UGL, UKET, UBIL, UHGB, UNIT, UROB, ULEU, UEPI, UWBC, URBC, UBAC, CAST, CRYS, UCOM, BILUA,  in the last 72 hours  MICROBIOLOGY: Recent Results (from the past 240 hour(s))  MRSA PCR SCREENING      Status: Abnormal   Collection Time    06/21/13 11:55 AM      Result Value Range Status   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 It is not     intended to diagnose MRSA     infection nor to guide or     monitor treatment for     MRSA infections.     RESULT CALLED TO, READ BACK BY AND VERIFIED WITH:     GOLDEN C,RN 1610 06/21/13 SCALES H    RADIOLOGY STUDIES/RESULTS: No results found.  Jeoffrey Massed, MD  [facility] Pager:[phone]  If 7PM-7AM, please contact night-coverage [email or web address]  Password [email or web address] 06/22/2013, 8:04 AM   LOS: 1 day

## 2013-06-22 NOTE — Transfer of Care (Signed)
Immediate Anesthesia Transfer of Care Note  Patient: [name] [name]  Procedure(s) Performed: Procedure(s): AMPUTATION BELOW KNEE  (Right)  Patient Location: PACU  Anesthesia Type:General  Level of Consciousness: responds to stimulation  Airway & Oxygen Therapy: Patient Spontanous Breathing and Patient connected to nasal cannula oxygen  Post-op Assessment: Report given to PACU RN and Post -op Vital signs reviewed and stable  Post vital signs: Reviewed and stable  Complications: No apparent anesthesia complications

## 2013-06-22 NOTE — Op Note (Signed)
NAMEAdelis Docter    MRN: [id number] DOB: March 26, 1956    DATE OF OPERATION: 06/22/2013  PREOP DIAGNOSIS: ischemic right leg  POSTOP DIAGNOSIS: same  PROCEDURE: right below the knee amputation  SURGEON: Di Kindle. Edilia Bo, MD, FACS  ASSIST: none  ANESTHESIA: Gen.   EBL: minimal  INDICATIONS: [name] [name] is a 57 y.o. female who had undergone multiple previous revascularization attempts at an outlying institution. She presented with profound ischemic right leg and no further options for revascularization. Primary amputation was recommended.  FINDINGS: I explored the muscle below the knee and it did appear to be potentially adequate for healing a below the knee amputation. I therefore elected to attempts below the knee amputation.  TECHNIQUE: The patient was taken to the operating room and received a general anesthetic. The right lower extremity was prepped and draped in usual sterile fashion. The circumference of the limb 10 cm distal to the tibial tuberosity was measured and two thirds of this distance was used to mark the anterior skin incision approximately 10 cm below the tibial tuberosity. A long posterior flap of equal length was then marked. I then made an incision in the anterior compartment and posterior compartments and the muscle did appear to be reasonably well perfused. I therefore elected to proceed with below the knee amputation. The leg was exsanguinated with an Esmarch bandage and the tourniquet inflated to 300 mm of mercury. Under tourniquet control, the incision was carried down to the skin, subcutaneous tissue, fascia and muscle to the tibia and fibula which were dissected free circumferentially. The periosteum was elevated. The tibia was divided proximal to the level skin division in the anterior aspect of the tibia was beveled. The fibula was divided. He arteries and veins were individually suture ligated with 2-0 silk ties. The tourniquet was then released. Additional  hemostasis was obtained using 2-0 silk sutures and electrocautery. The edges of the bone were rasped. The wound is irrigated with copious amounts of saline. The fascial layer was closed with interrupted 2-0 Vicryl. The skin was closed with staples. Sterile dressing was applied. The patient tolerated the procedure well and was transferred to the recovery room in stable condition. All needle and sponge counts were correct.  Waverly Ferrari, MD, FACS [facility]  DATE OF DICTATION:   06/22/2013

## 2013-06-22 NOTE — Progress Notes (Signed)
VASCULAR PROGRESS NOTE  SUBJECTIVE: Pain in right foot is worse.   PHYSICAL EXAM: Filed Vitals:   06/21/13 2156 06/21/13 2257 06/21/13 2345 06/22/13 0308  BP: 127/63 132/65 136/73 156/74  Pulse: 82 80 82 79  Temp: 99 F (37.2 C) 98.5 F (36.9 C) 98.3 F (36.8 C) 98.1 F (36.7 C)  TempSrc: Oral Oral Oral Oral  Resp: 22 21 18 21   Height:      Weight:    156 lb 4.9 oz (70.9 kg)  SpO2:   98% 99%   Right foot cold, no motor function, no sensory function. Right calf tender.   LABS: Lab Results  Component Value Date   WBC 8.1 06/22/2013   HGB 9.1* 06/22/2013   HCT 27.2* 06/22/2013   MCV 83.7 06/22/2013   PLT 147* 06/22/2013   Lab Results  Component Value Date   CREATININE 1.08 06/21/2013   Lab Results  Component Value Date   INR 1.03 06/21/2013   CBG (last 3)   Recent Labs  06/21/13 1923 06/21/13 2317 06/22/13 0306  GLUCAP 145* 204* 83    Principal Problem:   Ischemic leg Active Problems:   GI bleed   Acute blood loss anemia   PVD (peripheral vascular disease)   HTN (hypertension)   Diabetes  Upper endoscopy yesterday was negative.  ASSESSMENT AND PLAN:  Plan Right BKA or AKA today. I suspect that she will need an AKA given her calf tenderness and severe tibial disease.   Cari Caraway Beeper: [phone] 06/22/2013

## 2013-06-22 NOTE — Progress Notes (Signed)
Pt to OR-off monitor.

## 2013-06-22 NOTE — Anesthesia Postprocedure Evaluation (Signed)
  Anesthesia Post-op Note  Patient: [name] [name]  Procedure(s) Performed: Procedure(s): AMPUTATION BELOW KNEE  (Right)  Patient Location: PACU  Anesthesia Type:General  Level of Consciousness: awake  Airway and Oxygen Therapy: Patient Spontanous Breathing and Patient connected to nasal cannula oxygen  Post-op Pain: none  Post-op Assessment: Post-op Vital signs reviewed, Patient's Cardiovascular Status Stable, Respiratory Function Stable, Patent Airway and No signs of Nausea or vomiting  Post-op Vital Signs: Reviewed and stable  Complications: No apparent anesthesia complications

## 2013-06-22 NOTE — Anesthesia Preprocedure Evaluation (Addendum)
Anesthesia Evaluation  Patient identified by MRN, date of birth, ID band Patient awake    Reviewed: Allergy & Precautions, H&P , NPO status , Patient's Chart, lab work & pertinent test results  Airway Mallampati: II TM Distance: >3 FB Neck ROM: Full    Dental no notable dental hx. (+) Dental Advisory Given and Partial Lower   Pulmonary neg pulmonary ROS,  breath sounds clear to auscultation  Pulmonary exam normal       Cardiovascular hypertension, On Medications + Peripheral Vascular Disease Rhythm:Regular Rate:Normal     Neuro/Psych negative neurological ROS  negative psych ROS   GI/Hepatic negative GI ROS, Neg liver ROS,   Endo/Other  diabetes, Type 1, Insulin Dependent  Renal/GU negative Renal ROS  negative genitourinary   Musculoskeletal   Abdominal   Peds  Hematology negative hematology ROS (+)   Anesthesia Other Findings   Reproductive/Obstetrics negative OB ROS                         Anesthesia Physical Anesthesia Plan  ASA: III  Anesthesia Plan: General   Post-op Pain Management:    Induction: Intravenous  Airway Management Planned: Oral ETT  Additional Equipment:   Intra-op Plan:   Post-operative Plan: Extubation in OR  Informed Consent: I have reviewed the patients History and Physical, chart, labs and discussed the procedure including the risks, benefits and alternatives for the proposed anesthesia with the patient or authorized representative who has indicated his/her understanding and acceptance.   Dental advisory given  Plan Discussed with: CRNA and Surgeon  Anesthesia Plan Comments:         Anesthesia Quick Evaluation

## 2013-06-22 NOTE — Anesthesia Procedure Notes (Signed)
Procedure Name: Intubation Date/Time: 06/22/2013 9:04 AM Performed by: Luster Landsberg Pre-anesthesia Checklist: Patient identified, Emergency Drugs available, Suction available and Patient being monitored Patient Re-evaluated:Patient Re-evaluated prior to inductionOxygen Delivery Method: Circle system utilized Preoxygenation: Pre-oxygenation with 100% oxygen Intubation Type: IV induction Ventilation: Mask ventilation without difficulty and Oral airway inserted - appropriate to patient size Laryngoscope Size: Mac and 3 Grade View: Grade I Tube type: Oral Number of attempts: 1 Airway Equipment and Method: Stylet Placement Confirmation: ETT inserted through vocal cords under direct vision,  positive ETCO2 and breath sounds checked- equal and bilateral Secured at: 22 cm Tube secured with: Tape Dental Injury: Teeth and Oropharynx as per pre-operative assessment

## 2013-06-22 NOTE — Progress Notes (Signed)
ANTICOAGULATION CONSULT NOTE - Initial Consult  Pharmacy Consult for Warfarin Indication: PVD/ishemia  No Known Allergies  Patient Measurements: Height: 5\' 6"  (167.6 cm) Weight: 156 lb 4.9 oz (70.9 kg) IBW/kg (Calculated) : 59.3   Vital Signs: Temp: 98.1 F (36.7 C) (08/31 1201) Temp src: Oral (08/31 1201) BP: 144/66 mmHg (08/31 1201) Pulse Rate: 92 (08/31 1045)  Labs:  Recent Labs  06/21/13 1520 06/22/13 0135  HGB 7.0* 9.1*  HCT 20.8* 27.2*  PLT 139* 147*  LABPROT 13.3  --   INR 1.03  --   CREATININE 1.08  --     Estimated Creatinine Clearance: 53.2 ml/min (by C-G formula based on Cr of 1.08).   Medical History: No past medical history on file.  Medications:  Prescriptions prior to admission  Medication Sig Dispense Refill  . allopurinol (ZYLOPRIM) 100 MG tablet Take 100 mg by mouth daily.      Marland Kitchen amitriptyline (ELAVIL) 50 MG tablet Take 50 mg by mouth at bedtime.      . Azilsartan-Chlorthalidone (EDARBYCLOR) 40-12.5 MG TABS Take 1 tablet by mouth daily.      . DULoxetine (CYMBALTA) 60 MG capsule Take 60 mg by mouth daily.      . Iron-FA-B Cmp-C-Biot-Probiotic (FUSION PLUS PO) Take 1 capsule by mouth every morning.      Marland Kitchen oxyCODONE-acetaminophen (PERCOCET/ROXICET) 5-325 MG per tablet Take 1 tablet by mouth every 4 (four) hours as needed for pain.      . pregabalin (LYRICA) 300 MG capsule Take 300 mg by mouth 2 (two) times daily.      . rosuvastatin (CRESTOR) 20 MG tablet Take 20 mg by mouth daily.      . saxagliptin HCl (ONGLYZA) 2.5 MG TABS tablet Take 2.5 mg by mouth daily.      Marland Kitchen warfarin (COUMADIN) 4 MG tablet Take 4 mg by mouth daily.        Assessment: 57 y.o F with worsening pain in Rt foot since d/c from [place] [facility] on 8/29, and dark stools for 1-2 days. PMH include DM, HTN, HLD, and smoker.  INR on 8/30 was 1.03 and subtherapeutic.  Warfarin consult per pharmacy was ordered today.  Pt states she never took Xarelto due to cost, and has been on  long-term Coumadin 4 mg/day although she was supposed to increase her dose to 7.5 mg alternating with 5 mg .  Due to CBC being low, and pt has received transfusions, will start coumadin at the 4mg  today.  Goal of Therapy:  INR 2-3 Monitor platelets by anticoagulation protocol: Yes   Plan:  -Start Coumadin 4 mg x1 tonight -Monitor PT/INR, CBC, and s/sx of bleeding -ABX consult per pharmacy, but currently not on ABX, will check back   Anabel Bene 06/22/2013,1:08 PM  Anabel Bene, PharmD Clinical Pharmacist Pager: [phone]

## 2013-06-23 ENCOUNTER — Encounter (HOSPITAL_COMMUNITY): Payer: Self-pay | Admitting: Gastroenterology

## 2013-06-23 DIAGNOSIS — I739 Peripheral vascular disease, unspecified: Secondary | ICD-10-CM

## 2013-06-23 DIAGNOSIS — S88119A Complete traumatic amputation at level between knee and ankle, unspecified lower leg, initial encounter: Secondary | ICD-10-CM

## 2013-06-23 DIAGNOSIS — L98499 Non-pressure chronic ulcer of skin of other sites with unspecified severity: Secondary | ICD-10-CM

## 2013-06-23 LAB — CBC
HCT: 28.3 % — ABNORMAL LOW (ref 36.0–46.0)
MCHC: 31.8 g/dL (ref 30.0–36.0)
MCV: 86.5 fL (ref 78.0–100.0)
RDW: 17.7 % — ABNORMAL HIGH (ref 11.5–15.5)

## 2013-06-23 LAB — BASIC METABOLIC PANEL
BUN: 6 mg/dL (ref 6–23)
CO2: 31 mEq/L (ref 19–32)
Chloride: 100 mEq/L (ref 96–112)
Creatinine, Ser: 1.12 mg/dL — ABNORMAL HIGH (ref 0.50–1.10)
Glucose, Bld: 184 mg/dL — ABNORMAL HIGH (ref 70–99)

## 2013-06-23 LAB — GLUCOSE, CAPILLARY
Glucose-Capillary: 172 mg/dL — ABNORMAL HIGH (ref 70–99)
Glucose-Capillary: 176 mg/dL — ABNORMAL HIGH (ref 70–99)

## 2013-06-23 MED ORDER — POTASSIUM CHLORIDE CRYS ER 20 MEQ PO TBCR
EXTENDED_RELEASE_TABLET | ORAL | Status: AC
  Filled 2013-06-23: qty 2

## 2013-06-23 MED ORDER — MORPHINE SULFATE (PF) 1 MG/ML IV SOLN
INTRAVENOUS | Status: DC
  Administered 2013-06-23: 29.56 mg via INTRAVENOUS
  Administered 2013-06-23: 18:00:00 via INTRAVENOUS
  Administered 2013-06-23: 1.5 mg via INTRAVENOUS
  Administered 2013-06-23 – 2013-06-24 (×3): via INTRAVENOUS
  Administered 2013-06-24: 38.15 mg via INTRAVENOUS
  Administered 2013-06-24: 18.85 mg via INTRAVENOUS
  Administered 2013-06-24: 4.5 mg via INTRAVENOUS
  Administered 2013-06-24 – 2013-06-25 (×2): 9 mg via INTRAVENOUS
  Administered 2013-06-25: 14:00:00 via INTRAVENOUS
  Administered 2013-06-25: 3 mg via INTRAVENOUS
  Administered 2013-06-25: 13.6 mg via INTRAVENOUS
  Administered 2013-06-25: 13 mg via INTRAVENOUS
  Administered 2013-06-25: 06:00:00 via INTRAVENOUS
  Administered 2013-06-26: 3 mg via INTRAVENOUS
  Administered 2013-06-26: 13.5 mg via INTRAVENOUS
  Administered 2013-06-26: 6 mg via INTRAVENOUS
  Administered 2013-06-26: 7.3 mg via INTRAVENOUS
  Administered 2013-06-26: 7.5 mg via INTRAVENOUS
  Administered 2013-06-27: 25 mL via INTRAVENOUS
  Administered 2013-06-27: 7.5 mg via INTRAVENOUS
  Administered 2013-06-27: 38.74 mg via INTRAVENOUS
  Administered 2013-06-27: 12 mg via INTRAVENOUS
  Administered 2013-06-27 (×2): via INTRAVENOUS
  Administered 2013-06-27: 7.5 mg via INTRAVENOUS
  Administered 2013-06-28: 1.5 mg via INTRAVENOUS
  Administered 2013-06-28: 8.63 mg via INTRAVENOUS
  Administered 2013-06-28: 13.5 mg via INTRAVENOUS
  Filled 2013-06-23 (×13): qty 25

## 2013-06-23 MED ORDER — POTASSIUM CHLORIDE IN NACL 20-0.45 MEQ/L-% IV SOLN
INTRAVENOUS | Status: DC
  Administered 2013-06-23: 19:00:00 via INTRAVENOUS
  Administered 2013-06-24: 125 mL/h via INTRAVENOUS
  Administered 2013-06-24 – 2013-06-25 (×4): via INTRAVENOUS
  Administered 2013-06-25: 75 mL via INTRAVENOUS
  Administered 2013-06-26: 09:00:00 via INTRAVENOUS
  Filled 2013-06-23 (×11): qty 1000

## 2013-06-23 MED ORDER — POTASSIUM CHLORIDE CRYS ER 20 MEQ PO TBCR
40.0000 meq | EXTENDED_RELEASE_TABLET | Freq: Once | ORAL | Status: AC
  Administered 2013-06-23: 40 meq via ORAL

## 2013-06-23 MED ORDER — WARFARIN SODIUM 6 MG PO TABS
6.0000 mg | ORAL_TABLET | Freq: Once | ORAL | Status: AC
  Administered 2013-06-23: 6 mg via ORAL
  Filled 2013-06-23: qty 1

## 2013-06-23 MED ORDER — INSULIN ASPART 100 UNIT/ML ~~LOC~~ SOLN
0.0000 [IU] | Freq: Three times a day (TID) | SUBCUTANEOUS | Status: DC
  Administered 2013-06-23 – 2013-06-24 (×2): 2 [IU] via SUBCUTANEOUS
  Administered 2013-06-24 – 2013-06-27 (×5): 1 [IU] via SUBCUTANEOUS

## 2013-06-23 NOTE — Progress Notes (Signed)
Occupational Therapy Evaluation Patient Details Name: [name] [name] MRN: [id number] DOB: July 10, 1956 Today's Date: 06/23/2013 Time: 7829-5621 OT Time Calculation (min): 30 min  OT Assessment / Plan / Recommendation History of present illness Patient is a 57 yo female admitted with ischemic Rt leg.  Patient s/p Rt BKA.   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 Pt currently requires mod A for LB ADL and mobility and would benefit from CIR to return home @ mod I level. Pt will benefit from skilled OT services to facilitate D/C to CIR  due to below deficits. Educated pt on importance on terminal knee extension. Pt holding R knee in flexed position of comfort. May benefit from KI to assist with proper positioning of R knee.    OT Assessment  Patient needs continued OT Services    Follow Up Recommendations  CIR    Barriers to Discharge      Equipment Recommendations  3 in 1 bedside comode;Tub/shower bench    Recommendations for Other Services Rehab consult  Frequency  Min 3X/week    Precautions / Restrictions Precautions Precautions: Fall Restrictions Weight Bearing Restrictions: No   Pertinent Vitals/Pain 10/10. PCA. Repositioned LLE elevated    ADL  Grooming: Set up Where Assessed - Grooming: Unsupported sitting Upper Body Bathing: Set up Where Assessed - Upper Body Bathing: Unsupported sitting Lower Body Bathing: Moderate assistance Where Assessed - Lower Body Bathing: Supported sit to stand Upper Body Dressing: Set up Where Assessed - Upper Body Dressing: Unsupported sitting Lower Body Dressing: Maximal assistance Where Assessed - Lower Body Dressing: Supported sit to stand Toilet Transfer: Moderate assistance Toilet Transfer Method: Sit to stand;Stand pivot Toilet Transfer Equipment: Bedside commode Toileting - Clothing Manipulation and Hygiene: Moderate assistance Where Assessed - Toileting Clothing Manipulation and Hygiene: Sit  to stand from 3-in-1 or toilet Equipment Used: Gait belt Transfers/Ambulation Related to ADLs: pt wanting to do it herself    OT Diagnosis: Generalized weakness;Acute pain  OT Problem List: Decreased strength;Decreased activity tolerance;Impaired balance (sitting and/or standing);Decreased safety awareness;Decreased knowledge of use of DME or AE;Decreased knowledge of precautions;Pain OT Treatment Interventions: Self-care/ADL training;Therapeutic exercise;Energy conservation;DME and/or AE instruction;Therapeutic activities;Patient/family education;Balance training   OT Goals(Current goals can be found in the care plan section) Acute Rehab OT Goals Patient Stated Goal: to get home OT Goal Formulation: With patient Time For Goal Achievement: 07/07/13 Potential to Achieve Goals: Good  Visit Information  Last OT Received On: 06/23/13 Assistance Needed: +1 History of Present Illness: Patient is a 57 yo female admitted with ischemic Rt leg.  Patient s/p Rt BKA.       Prior Functioning     Home Living Family/patient expects to be discharged to:: Private residence Living Arrangements: Other relatives (Grandson - 74 yo) Available Help at Discharge: Friend(s);Available PRN/intermittently Type of Home: House (Duplex) Home Access: Stairs to enter Entrance Stairs-Number of Steps: 3 Entrance Stairs-Rails: Right;Left Home Layout: One level Home Equipment: Bedside commode;Shower seat Prior Function Level of Independence: Independent Comments: Does not drive (fell 3 times PTA) Communication Communication: No difficulties         Vision/Perception Vision - History Baseline Vision: No visual deficits   Cognition  Cognition Arousal/Alertness: Awake/alert Behavior During Therapy: Anxious;Impulsive Overall Cognitive Status: Within Functional Limits for tasks assessed    Extremity/Trunk Assessment Upper Extremity Assessment Upper Extremity Assessment: Overall WFL for tasks  assessed Lower Extremity Assessment Lower Extremity Assessment: RLE deficits/detail RLE Deficits / Details: Hip flex 4/5.  Knee  flex and ext 3-/5 due to pain. RLE: Unable to fully assess due to pain Cervical / Trunk Assessment Cervical / Trunk Assessment: Normal     Mobility Bed Mobility Bed Mobility: Sit to Supine Supine to Sit: 4: Min assist Sitting - Scoot to Edge of Bed: 4: Min guard Details for Bed Mobility Assistance: A for LLE Transfers Transfers: Sit to Stand;Stand to Sit Sit to Stand: 4: Min assist;From chair/3-in-1 Stand to Sit: 4: Min assist;To bed Details for Transfer Assistance: vc for hand placement, able to pivot on LLE     Exercise Other Exercises: encouraged full knee extension.pt holding in flexed position   Balance Balance Balance Assessed: Yes Static Sitting Balance Static Sitting - Balance Support: No upper extremity supported Static Sitting - Level of Assistance: 5: Stand by assistance Static Sitting - Comment/# of Minutes: 6   End of Session OT - End of Session Equipment Utilized During Treatment: Gait belt Activity Tolerance: Patient limited by pain Patient left: in bed;with call bell/phone within reach;with nursing/sitter in room Nurse Communication: Mobility status;Other (comment) (need to encourage knee extension)  GO     [name] [name],[name] 06/23/2013, 7:05 PM [facility], OTR/L  [phone] 06/23/2013

## 2013-06-23 NOTE — Progress Notes (Signed)
VASCULAR PROGRESS NOTE  SUBJECTIVE: Pain adequately controlled.   PHYSICAL EXAM: Filed Vitals:   06/23/13 0729 06/23/13 0734 06/23/13 0755 06/23/13 0837  BP:   85/49 94/58  Pulse:    99  Temp:  100.2 F (37.9 C)    TempSrc:  Oral    Resp: 12 12  9   Height:      Weight:      SpO2: 100% 100%  99%   The dressing felt tight, so I changed it. Right BKA looks good so far.  No drainage.   LABS: Lab Results  Component Value Date   WBC 10.5 06/23/2013   HGB 9.0* 06/23/2013   HCT 28.3* 06/23/2013   MCV 86.5 06/23/2013   PLT 192 06/23/2013   Lab Results  Component Value Date   CREATININE 1.12* 06/23/2013   Lab Results  Component Value Date   INR 1.00 06/23/2013   CBG (last 3)   Recent Labs  06/22/13 2025 06/22/13 2309 06/23/13 0420  GLUCAP 188* 172* 176*    Principal Problem:   Ischemic leg Active Problems:   GI bleed   Acute blood loss anemia   PVD (peripheral vascular disease)   HTN (hypertension)   Diabetes  ASSESSMENT AND PLAN:  *  1 Day Post-Op s/p: Right BKA  * Begin dressing changes tomorrow.   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    Cari Caraway Beeper: [phone] 06/23/2013

## 2013-06-23 NOTE — Progress Notes (Signed)
ANTICOAGULATION CONSULT NOTE - Follow Up Consult  Pharmacy Consult for Warfarin Indication: Severe PVD/ischemia  No Known Allergies  Patient Measurements: Height: 5\' 6"  (167.6 cm) Weight: 170 lb 13.7 oz (77.5 kg) IBW/kg (Calculated) : 59.3  Vital Signs: Temp: 99.9 F (37.7 C) (09/01 1215) Temp src: Oral (09/01 1215) BP: 93/57 mmHg (09/01 1218) Pulse Rate: 99 (09/01 0837)  Labs:  Recent Labs  06/21/13 1520 06/22/13 0135 06/22/13 1321 06/23/13 0535  HGB 7.0* 9.1* 8.7* 9.0*  HCT 20.8* 27.2* 25.8* 28.3*  PLT 139* 147* 152 192  LABPROT 13.3  --   --  13.0  INR 1.03  --   --  1.00  CREATININE 1.08  --  0.95 1.12*    Estimated Creatinine Clearance: 57.6 ml/min (by C-G formula based on Cr of 1.12).   Medications:  Scheduled:  . allopurinol  100 mg Oral Daily  . amitriptyline  50 mg Oral QHS  . atorvastatin  40 mg Oral q1800  . Chlorhexidine Gluconate Cloth  6 each Topical Q0600  . docusate sodium  100 mg Oral Daily  . DULoxetine  60 mg Oral Daily  . enoxaparin (LOVENOX) injection  40 mg Subcutaneous Q24H  . insulin aspart  0-9 Units Subcutaneous Q4H  . irbesartan  300 mg Oral Daily  . morphine   Intravenous Q4H  . mupirocin ointment  1 application Nasal BID  . pantoprazole  40 mg Oral BID  . pregabalin  300 mg Oral BID  . sodium chloride  3 mL Intravenous Q12H  . warfarin  4 mg Oral q1800  . Warfarin - Pharmacist Dosing Inpatient   Does not apply q1800    Assessment: 57 yo F with recent complicated history.  Patient is s/p thrombolysis procedure at [facility] for occluded femoropopliteal bypass with limited success, discharged on 8/29.  She then returned to [facility] on 8/30 with pain in right foot.  Right foot was found to be pulseless and ischemic. Transferred to [facility], she is now s/p R-BKA on 8/31.  Complicating the issue, patient also reports 1-2 days of melanotic stools with acute blood loss anemia.  She is now s/p blood transfusion, which showed an  appropriate rise in H/H.  Furthermore, EGD found no foci to explain melanotic stools.  Pharmacy was consulted for restarting coumadin therapy and patient is currently on day#2 of re-initiation.  Patient is only on a VTE prophylactic dose of enoxaparin, not a treatment dose bridge. Due to patient's risk of bleed and need for balance with risk of occlusion, will not be overly aggressive in dosing, but will increase the dose due to a drop in the INR from 1.03 yesterday to 1.00 today after a single 4mg  dose. Will aim for a gradual but steady increase in INR.  Spoke with nurse and confirmed no s/s of bleeding are currently present.    Goal of Therapy:  INR 2-3 Monitor platelets by anticoagulation protocol: Yes   Plan:  - Coumadin 6mg  PO x1 dose tonight - daily INR, and for this patient daily CBC - monitor for s/s of bleeding  Harrold Donath E. Achilles Dunk, PharmD Clinical Pharmacist - Resident Pager: [phone] Pharmacy: [phone] 06/23/2013 2:19 PM

## 2013-06-23 NOTE — Consult Note (Signed)
Physical Medicine and Rehabilitation Consult   Reason for Consult: R- BKA Referring Physician: Dr. Edilia Bo   HPI: [name] [name] is a 57 y.o. female with history of severe PVD with FPBG and RLL thrombectomy at [facility] hospital few days PTA on 06/21/13 with ischemic RLE with foot pain and numbness as well as black stools.  She underwent EGD by Dr. Madilyn Fireman and this was negative for source of bleeding with recommendations to monitor H/H and colonoscopy is indicated. She was evaluated by Dr. 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 PT/OT evaluations to be done today. MD recommending CIR.  Patient complains of pain. As per RN, patient was lethargic this morning on PCA pump, switched to oral meds  Review of Systems  Gastrointestinal: Positive for constipation.  Musculoskeletal: Positive for joint pain.  All other systems reviewed and are negative.   History reviewed. No pertinent past medical history. Past Surgical History  Procedure Laterality Date  . Esophagogastroduodenoscopy N/A 06/21/2013    Procedure: ESOPHAGOGASTRODUODENOSCOPY (EGD);  Surgeon: Barrie Folk, MD;  Location: [facility] ENDOSCOPY;  Service: Endoscopy;  Laterality: N/A;   History reviewed. No pertinent family history.  Social History:  reports that she has been smoking.  She does not have any smokeless tobacco history on file. Her alcohol and drug histories are not on file.  Allergies: No Known Allergies  Medications Prior to Admission  Medication Sig Dispense Refill  . allopurinol (ZYLOPRIM) 100 MG tablet Take 100 mg by mouth daily.      Marland Kitchen amitriptyline (ELAVIL) 50 MG tablet Take 50 mg by mouth at bedtime.      . Azilsartan-Chlorthalidone (EDARBYCLOR) 40-12.5 MG TABS Take 1 tablet by mouth daily.      . DULoxetine (CYMBALTA) 60 MG capsule Take 60 mg by mouth daily.      . Iron-FA-B Cmp-C-Biot-Probiotic (FUSION PLUS PO) Take 1 capsule by mouth every morning.      Marland Kitchen oxyCODONE-acetaminophen (PERCOCET/ROXICET) 5-325 MG per tablet  Take 1 tablet by mouth every 4 (four) hours as needed for pain.      . pregabalin (LYRICA) 300 MG capsule Take 300 mg by mouth 2 (two) times daily.      . rosuvastatin (CRESTOR) 20 MG tablet Take 20 mg by mouth daily.      . saxagliptin HCl (ONGLYZA) 2.5 MG TABS tablet Take 2.5 mg by mouth daily.      Marland Kitchen warfarin (COUMADIN) 4 MG tablet Take 4 mg by mouth daily.        Home: Home Living Family/patient expects to be discharged to:: Private residence Living Arrangements: Other relatives (grandson)  Functional History:   Functional Status:  Mobility:          ADL:    Cognition: Cognition Orientation Level:  (UTA, pt. uncooperative at this time)    Blood pressure 94/58, pulse 99, temperature 100.2 F (37.9 C), temperature source Oral, resp. rate 9, height 5\' 6"  (1.676 m), weight 77.5 kg (170 lb 13.7 oz), SpO2 99.00%.   Physical Exam  Nursing note and vitals reviewed. Constitutional: She is oriented to person, place, and time. She appears well-developed and well-nourished.  HENT:  Head: Atraumatic.  Eyes: Conjunctivae and EOM are normal.  Cardiovascular: Regular rhythm and normal heart sounds.   Tachycardia  Pulmonary/Chest: Effort normal and breath sounds normal.  Abdominal: Soft. Bowel sounds are normal.  Neurological: She is alert and oriented to person, place, and time. No sensory deficit. Gait abnormal.  Motor strength is 5/5 bilateral deltoid, bicep, tricep, grip  Right hip flexor 2 minus left hip flexor 5/5 left knee extensor ankle dorsiflexor plantar flexor 5/5 Sensory exam is normal  Psychiatric: Her mood appears anxious.    Results for orders placed during the hospital encounter of 06/21/13 (from the past 24 hour(s))  GLUCOSE, CAPILLARY     Status: Abnormal   Collection Time    06/22/13 10:20 AM      Result Value Range   Glucose-Capillary 117 (*) 70 - 99 mg/dL   Comment 1 Notify RN    CBC     Status: Abnormal   Collection Time    06/22/13  1:21 PM       Result Value Range   WBC 7.8  4.0 - 10.5 K/uL   RBC 3.04 (*) 3.87 - 5.11 MIL/uL   Hemoglobin 8.7 (*) 12.0 - 15.0 g/dL   HCT 16.1 (*) 09.6 - 04.5 %   MCV 84.9  78.0 - 100.0 fL   MCH 28.6  26.0 - 34.0 pg   MCHC 33.7  30.0 - 36.0 g/dL   RDW 40.9 (*) 81.1 - 91.4 %   Platelets 152  150 - 400 K/uL  CREATININE, SERUM     Status: Abnormal   Collection Time    06/22/13  1:21 PM      Result Value Range   Creatinine, Ser 0.95  0.50 - 1.10 mg/dL   GFR calc non Af Amer 65 (*) >90 mL/min   GFR calc Af Amer 75 (*) >90 mL/min  GLUCOSE, CAPILLARY     Status: Abnormal   Collection Time    06/22/13  6:12 PM      Result Value Range   Glucose-Capillary 139 (*) 70 - 99 mg/dL   Comment 1 Notify RN    GLUCOSE, CAPILLARY     Status: Abnormal   Collection Time    06/22/13  8:25 PM      Result Value Range   Glucose-Capillary 188 (*) 70 - 99 mg/dL   Comment 1 Notify RN    GLUCOSE, CAPILLARY     Status: Abnormal   Collection Time    06/22/13 11:09 PM      Result Value Range   Glucose-Capillary 172 (*) 70 - 99 mg/dL   Comment 1 Notify RN    GLUCOSE, CAPILLARY     Status: Abnormal   Collection Time    06/23/13  4:20 AM      Result Value Range   Glucose-Capillary 176 (*) 70 - 99 mg/dL   Comment 1 Notify RN    BASIC METABOLIC PANEL     Status: Abnormal   Collection Time    06/23/13  5:35 AM      Result Value Range   Sodium 138  135 - 145 mEq/L   Potassium 3.3 (*) 3.5 - 5.1 mEq/L   Chloride 100  96 - 112 mEq/L   CO2 31  19 - 32 mEq/L   Glucose, Bld 184 (*) 70 - 99 mg/dL   BUN 6  6 - 23 mg/dL   Creatinine, Ser 7.82 (*) 0.50 - 1.10 mg/dL   Calcium 8.7  8.4 - 95.6 mg/dL   GFR calc non Af Amer 53 (*) >90 mL/min   GFR calc Af Amer 62 (*) >90 mL/min  CBC     Status: Abnormal   Collection Time    06/23/13  5:35 AM      Result Value Range   WBC 10.5  4.0 - 10.5 K/uL   RBC 3.27 (*)  3.87 - 5.11 MIL/uL   Hemoglobin 9.0 (*) 12.0 - 15.0 g/dL   HCT 16.1 (*) 09.6 - 04.5 %   MCV 86.5  78.0 - 100.0 fL    MCH 27.5  26.0 - 34.0 pg   MCHC 31.8  30.0 - 36.0 g/dL   RDW 40.9 (*) 81.1 - 91.4 %   Platelets 192  150 - 400 K/uL  PROTIME-INR     Status: None   Collection Time    06/23/13  5:35 AM      Result Value Range   Prothrombin Time 13.0  11.6 - 15.2 seconds   INR 1.00  0.00 - 1.49   No results found.  Assessment/Plan: Diagnosis: Right BKA postoperative day #1 1. Does the need for close, 24 hr/day medical supervision in concert with the patient's rehab needs make it unreasonable for this patient to be served in a less intensive setting? Potentially 2. Co-Morbidities requiring supervision/potential complications: Pain control, diabetes, hypertension 3. Due to bladder management, bowel management, safety, skin/wound care, disease management, medication administration, pain management and patient education, does the patient require 24 hr/day rehab nursing? Yes 4. Does the patient require coordinated care of a physician, rehab nurse, PT (1-2 hrs/day, 5 days/week) and OT (1-2 hrs/day, 5 days/week) to address physical and functional deficits in the context of the above medical diagnosis(es)? Yes Addressing deficits in the following areas: balance, endurance, locomotion, strength, transferring, bowel/bladder control, bathing, dressing, feeding, grooming, toileting and cognition 5. Can the patient actively participate in an intensive therapy program of at least 3 hrs of therapy per day at least 5 days per week? Potentially 6. The potential for patient to make measurable gains while on inpatient rehab is good 7. Anticipated functional outcomes upon discharge from inpatient rehab are supervision to modified independent with PT, supervision to modified independent with OT, not applicable with SLP. 8. Estimated rehab length of stay to reach the above functional goals is: 7-10 days 9. Does the patient have adequate social supports to accommodate these discharge functional goals? Potentially 10. Anticipated  D/C setting: Home 11. Anticipated post D/C treatments: HH therapy 12. Overall Rehab/Functional Prognosis: good  RECOMMENDATIONS: This patient's condition is appropriate for continued rehabilitative care in the following setting: Anticipate CIR in 1-2 days. Needs OT PT consult. Needs pain managed with oral medications Patient has agreed to participate in recommended program. Potentially Note that insurance prior authorization may be required for reimbursement for recommended care.  Comment:     06/23/2013

## 2013-06-23 NOTE — Progress Notes (Addendum)
[facility] Progress Note [place] TEAM 1 - Stepdown/ICU TEAM   [name] [name] GNF:[id number] DOB: Apr 04, 1956 DOA: 06/21/2013 PCP: No primary provider on file.  Admit HPI / Brief Narrative: 57 y.o. female with a Past Medical History of severe peripheral vascular disease status post femoropopliteal bypass last year, with a recent history of acute ischemic leg with thrombectomy just 2 days ago at [facility], discharged yesterday on Coumadin and aspirin who presented with worsening pain in the right foot and black stools for 1-2 days.     Apparently, since discharge from [facility], patient continued to have pain in the right foot area, overnight it worsened, as a result she presented to [facility] emergency room today, where she was found to have cold and pulseless right foot. She also gave a history of black tarry stools for the past 2 days. She was found to have a hemoglobin of 7.6, which was a significant drop from just a few days ago. [facility] hospitalist was contacted by [facility] ED, both vascular surgery and gastroenterology was also consulted, patient was then transferred to the step down unit here for further evaluation and management.  On reviewing the records from recent hospitalization at [facility], and also reviewing the operative note from vascular surgery, it looks like the patient's femoropopliteal graft is occluded, and they have attempted to remove clots 2 days ago with limited success. Patient was told by vascular surgery at [facility], that if the graft again reoccluded, she would require amputation. Patient was aware of this.  Patient is now being admitted, vascular surgery and GI consultation are in progress.   Assessment/Plan:  Ischemic leg - Right  -known hx of PVD s/p femoropopliteal bypass and recent thrombectomy for the occluded graft on 8/28 at [facility]. It was noted that if this fails she would require amputation.Patient was  admitted on 8/30 for worsening right leg pain. 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  -she has known PVD - may need anti-platelet agents/coumadin re-initiated once ok with GI   Upper GI bleed  -Patient on chronic Coumadin therapy for severe peripheral vascular disease and aspirin, unfortunately along with a right ischemic leg also had active melanotic stools with severe acute blood loss anemia.  -EGD on 8/30 - no foci to explain melanotic stools  -c/w PPI  -Hgb stable at this time   Acute blood loss anemia  -Hb stable after 2 units of PRBC transfusion  -monitor periodically   Hx of PVD (peripheral vascular disease)  -Patient has a long-standing history of severe peripheral vascular disease-requiring numerous thrombectomies and ultimately a femoropopliteal bypass last year. Unfortunately, it looks like the femoropopliteal graft has now become occluded, only a few days ago in [facility], her vascular surgeon attempted to the open this graft but had very limited success. After reviewing the operative note from a vascular surgeon at [facility], it is clearly stated that if the graft were to again close, patient would require a amputation -postop care per [facility]  HTN / hypotension -currently off all antihypertensives - BP soft, likey due to narcotics - decrease dose of PCA due to heavy sedation   Diabetes  -no change in tx plan today   Mild hypokalemia -likely due  to poor intake - supplement and follow   Code Status: FULL Family Communication: no family present at time of exam Disposition Plan: SDU  Consultants: V[facility] Surgery GI - [facility]   Procedures: EGD - 8/30 R BKA - 8/31  Antibiotics: none  DVT  prophylaxis: lovenox  HPI/Subjective: Pt is heavily sedated.  Difficult to wake up.  Unable to provide hx due to sedation.  Is currently on full dose morphine PCA.  Objective: Blood pressure 93/57, pulse 99, temperature 99.9 F (37.7 C), temperature source Oral, resp. rate 9, height 5\' 6"  (1.676 m), weight 77.5 kg (170 lb 13.7 oz), SpO2 100.00%.  Intake/Output Summary (Last 24 hours) at 06/23/13 1638 Last data filed at 06/23/13 1500  Gross per 24 hour  Intake   1700 ml  Output   1650 ml  Net     50 ml   Exam: General: No acute respiratory distress Lungs: Clear to auscultation bilaterally without wheezes or crackles Cardiovascular: Regular rate and rhythm without murmur gallop or rub normal S1 and S2 Abdomen: Nontender, nondistended, soft, bowel sounds positive, no rebound, no ascites, no appreciable mass Extremities: No significant cyanosis, clubbing, or edema L lower extremity  Data Reviewed: Basic Metabolic Panel:  Recent Labs Lab 06/21/13 1520 06/22/13 1321 06/23/13 0535  NA 142  --  138  K 3.4*  --  3.3*  CL 101  --  100  CO2 35*  --  31  GLUCOSE 98  --  184*  BUN 8  --  6  CREATININE 1.08 0.95 1.12*  CALCIUM 8.5  --  8.7   Liver Function Tests:  Recent Labs Lab 06/21/13 1520  AST 75*  ALT 27  ALKPHOS 38*  BILITOT 0.4  PROT 5.2*  ALBUMIN 2.8*   CBC:  Recent Labs Lab 06/21/13 1520 06/22/13 0135 06/22/13 1321 06/23/13 0535  WBC 9.2 8.1 7.8 10.5  HGB 7.0* 9.1* 8.7* 9.0*  HCT 20.8* 27.2* 25.8* 28.3*  MCV 86.0 83.7 84.9 86.5  PLT 139* 147* 152 192   CBG:  Recent Labs Lab 06/22/13 1020 06/22/13 1812 06/22/13 2025 06/22/13 2309 06/23/13 0420  GLUCAP 117* 139* 188* 172* 176*    Recent Results (from the past 240 hour(s))  MRSA PCR SCREENING     Status: Abnormal   Collection Time    06/21/13 11:55 AM      Result Value Range Status   MRSA by PCR POSITIVE (*) NEGATIVE Final   Comment:            The GeneXpert MRSA Assay (FDA     approved  for NASAL specimens     only), is one component of a     comprehensive MRSA colonization     surveillance program. It is not     intended to diagnose MRSA     infection nor to guide or     monitor treatment for     MRSA infections.     RESULT CALLED TO, READ BACK BY AND VERIFIED WITH:     GOLDEN C,RN 1610 06/21/13 SCALES H     Studies:  Recent x-ray studies have been reviewed in detail by the Attending Physician  Scheduled Meds:  Scheduled Meds: . allopurinol  100 mg Oral Daily  . amitriptyline  50 mg Oral QHS  . atorvastatin  40 mg Oral q1800  . Chlorhexidine Gluconate Cloth  6 each Topical Q0600  . docusate sodium  100 mg Oral Daily  . DULoxetine  60 mg Oral Daily  .  enoxaparin (LOVENOX) injection  40 mg Subcutaneous Q24H  . insulin aspart  0-9 Units Subcutaneous Q4H  . irbesartan  300 mg Oral Daily  . morphine   Intravenous Q4H  . mupirocin ointment  1 application Nasal BID  . pantoprazole  40 mg Oral BID  . pregabalin  300 mg Oral BID  . sodium chloride  3 mL Intravenous Q12H  . warfarin  4 mg Oral q1800  . warfarin  6 mg Oral ONCE-1800  . Warfarin - Pharmacist Dosing Inpatient   Does not apply q1800    Time spent on care of this patient: 35 mins   [facility]  [facility] Office  [phone] Pager - Text Page per Loretha Stapler as per below:  On-Call/Text Page:      [email or web address]      password [email or web address]  If 7PM-7AM, please contact night-coverage [email or web address] Password [email or web address] 06/23/2013, 4:38 PM   LOS: 2 days

## 2013-06-23 NOTE — Evaluation (Signed)
Physical Therapy Evaluation Patient Details Name: [name] [name] MRN: [id number] DOB: 1956-02-27 Today's Date: 06/23/2013 Time: 4098-1191 PT Time Calculation (min): 26 min  PT Assessment / Plan / Recommendation History of Present Illness  Patient is a 57 yo female admitted with ischemic Rt leg.  Patient s/p Rt BKA.  Clinical Impression  Patient presents with problems listed below.  Will benefit from acute PT to maximize independence prior to discharge.  Recommend Inpatient Rehab consult.  Patient was independent pta, caring for 8 yo grandson.  Will benefit from comprehensive therapy program to maximize functional independence prior to discharge home.    PT Assessment  Patient needs continued PT services    Follow Up Recommendations  CIR;Supervision/Assistance - 24 hour    Does the patient have the potential to tolerate intense rehabilitation      Barriers to Discharge Decreased caregiver support Grandson (12 yo) lives with patient.  Reports friend can come and help her out.    Equipment Recommendations  Rolling walker with 5" wheels;Wheelchair (measurements PT);Wheelchair cushion (measurements PT)    Recommendations for Other Services Rehab consult   Frequency Min 4X/week    Precautions / Restrictions Precautions Precautions: Fall Restrictions Weight Bearing Restrictions: No   Pertinent Vitals/Pain Pain 10/10 with mobility.      Mobility  Bed Mobility Bed Mobility: Supine to Sit;Sitting - Scoot to Edge of Bed Supine to Sit: 4: Min guard;HOB elevated Sitting - Scoot to Edge of Bed: 4: Min guard Details for Bed Mobility Assistance: Verbal cues for technique.  Patient moving slowly due to pain.  Patient sat EOB x 6 minutes with good balance.  Performed RLE exercises. Transfers Transfers: Editor, commissioning Transfers: 3: Mod assist Details for Transfer Assistance: Verbal cues for technique and hand placement.  Patient able to pivot on LLE to chair.  Unable to  reach fully upright position. Ambulation/Gait Ambulation/Gait Assistance: Not tested (comment)    Exercises Amputee Exercises Hip Flexion/Marching: AROM;Right;10 reps;Seated Knee Flexion: AROM;Right;10 reps;Seated Knee Extension: AROM;Right;10 reps;Seated   PT Diagnosis: Difficulty walking;Generalized weakness;Acute pain  PT Problem List: Decreased strength;Decreased activity tolerance;Decreased balance;Decreased mobility;Decreased knowledge of use of DME;Decreased safety awareness;Pain PT Treatment Interventions: DME instruction;Gait training;Stair training;Functional mobility training;Therapeutic exercise;Patient/family education     PT Goals(Current goals can be found in the care plan section) Acute Rehab PT Goals Patient Stated Goal: To quit hurting PT Goal Formulation: With patient Time For Goal Achievement: 07/07/13 Potential to Achieve Goals: Good  Visit Information  Last PT Received On: 06/23/13 Assistance Needed: +1 History of Present Illness: Patient is a 57 yo female admitted with ischemic Rt leg.  Patient s/p Rt BKA.       Prior Functioning  Home Living Family/patient expects to be discharged to:: Private residence Living Arrangements: Other relatives (Grandson - 74 yo) Available Help at Discharge: Friend(s);Available PRN/intermittently Type of Home: House ([place]) Home Access: Stairs to enter Entrance Stairs-Number of Steps: 3 Entrance Stairs-Rails: Right;Left Home Layout: One level Home Equipment: Bedside commode;Shower seat Prior Function Level of Independence: Independent Comments: Does not drive Communication Communication: No difficulties    Cognition  Cognition Arousal/Alertness: Awake/alert Behavior During Therapy: Anxious;Impulsive Overall Cognitive Status: Within Functional Limits for tasks assessed    Extremity/Trunk Assessment Upper Extremity Assessment Upper Extremity Assessment: Overall WFL for tasks assessed Lower Extremity  Assessment Lower Extremity Assessment: RLE deficits/detail RLE Deficits / Details: Hip flex 4/5.  Knee flex and ext 3-/5 due to pain. RLE: Unable to fully assess due to pain Cervical /  Trunk Assessment Cervical / Trunk Assessment: Normal   Balance Balance Balance Assessed: Yes Static Sitting Balance Static Sitting - Balance Support: No upper extremity supported Static Sitting - Level of Assistance: 5: Stand by assistance Static Sitting - Comment/# of Minutes: 6  End of Session PT - End of Session Equipment Utilized During Treatment: Gait belt Activity Tolerance: Patient limited by pain Patient left: in chair;with call bell/phone within reach;with nursing/sitter in room Nurse Communication: Mobility status  GP     Vena Austria 06/23/2013, 4:46 PM Durenda Hurt. Renaldo Fiddler, [facility] Acute Rehab Services Pager [phone]

## 2013-06-24 ENCOUNTER — Inpatient Hospital Stay (HOSPITAL_COMMUNITY): Payer: PRIVATE HEALTH INSURANCE

## 2013-06-24 ENCOUNTER — Encounter (HOSPITAL_COMMUNITY): Payer: Self-pay | Admitting: Vascular Surgery

## 2013-06-24 DIAGNOSIS — K567 Ileus, unspecified: Secondary | ICD-10-CM | POA: Diagnosis not present

## 2013-06-24 DIAGNOSIS — K560 Paralytic ileus: Secondary | ICD-10-CM

## 2013-06-24 LAB — CBC WITH DIFFERENTIAL/PLATELET
HCT: 25.9 % — ABNORMAL LOW (ref 36.0–46.0)
Hemoglobin: 8.3 g/dL — ABNORMAL LOW (ref 12.0–15.0)
Lymphocytes Relative: 12 % (ref 12–46)
Monocytes Absolute: 1.9 10*3/uL — ABNORMAL HIGH (ref 0.1–1.0)
Monocytes Relative: 17 % — ABNORMAL HIGH (ref 3–12)
Neutro Abs: 7.9 10*3/uL — ABNORMAL HIGH (ref 1.7–7.7)
WBC: 11.3 10*3/uL — ABNORMAL HIGH (ref 4.0–10.5)

## 2013-06-24 LAB — PROTIME-INR: INR: 1.12 (ref 0.00–1.49)

## 2013-06-24 LAB — GLUCOSE, CAPILLARY
Glucose-Capillary: 180 mg/dL — ABNORMAL HIGH (ref 70–99)
Glucose-Capillary: 157 mg/dL — ABNORMAL HIGH (ref 70–99)
Glucose-Capillary: 171 mg/dL — ABNORMAL HIGH (ref 70–99)

## 2013-06-24 LAB — BASIC METABOLIC PANEL
BUN: 11 mg/dL (ref 6–23)
CO2: 31 mEq/L (ref 19–32)
Calcium: 8.6 mg/dL (ref 8.4–10.5)
Creatinine, Ser: 1.33 mg/dL — ABNORMAL HIGH (ref 0.50–1.10)
GFR calc Af Amer: 50 mL/min — ABNORMAL LOW (ref >90)

## 2013-06-24 LAB — CBC
MCHC: 32.6 g/dL (ref 30.0–36.0)
MCV: 86.3 fL (ref 78.0–100.0)
Platelets: 221 10*3/uL (ref 150–400)
RDW: 17.1 % — ABNORMAL HIGH (ref 11.5–15.5)
WBC: 10.3 10*3/uL (ref 4.0–10.5)

## 2013-06-24 MED ORDER — BISACODYL 10 MG RE SUPP
10.0000 mg | Freq: Once | RECTAL | Status: AC
  Administered 2013-06-24: 10 mg via RECTAL
  Filled 2013-06-24: qty 1

## 2013-06-24 MED ORDER — ACETAMINOPHEN 325 MG PO TABS
325.0000 mg | ORAL_TABLET | Freq: Once | ORAL | Status: AC
  Administered 2013-06-24: 325 mg via ORAL

## 2013-06-24 MED ORDER — WARFARIN SODIUM 7.5 MG PO TABS
7.5000 mg | ORAL_TABLET | Freq: Once | ORAL | Status: AC
  Administered 2013-06-24: 7.5 mg via ORAL
  Filled 2013-06-24: qty 1

## 2013-06-24 MED ORDER — METOPROLOL TARTRATE 1 MG/ML IV SOLN
5.0000 mg | Freq: Four times a day (QID) | INTRAVENOUS | Status: DC | PRN
  Administered 2013-06-25 – 2013-06-26 (×2): 5 mg via INTRAVENOUS
  Filled 2013-06-24 (×2): qty 5

## 2013-06-24 MED ORDER — BISACODYL 5 MG PO TBEC
10.0000 mg | DELAYED_RELEASE_TABLET | Freq: Two times a day (BID) | ORAL | Status: DC
  Administered 2013-06-24 – 2013-06-30 (×13): 10 mg via ORAL
  Filled 2013-06-24 (×2): qty 2
  Filled 2013-06-24: qty 1
  Filled 2013-06-24 (×3): qty 2
  Filled 2013-06-24: qty 1
  Filled 2013-06-24 (×10): qty 2

## 2013-06-24 MED ORDER — MAGNESIUM CITRATE PO SOLN
1.0000 | Freq: Once | ORAL | Status: AC
  Administered 2013-06-24: 1 via ORAL
  Filled 2013-06-24: qty 296

## 2013-06-24 MED ORDER — BISACODYL 5 MG PO TBEC
10.0000 mg | DELAYED_RELEASE_TABLET | Freq: Once | ORAL | Status: AC
  Administered 2013-06-24: 10 mg via ORAL
  Filled 2013-06-24: qty 2

## 2013-06-24 NOTE — Progress Notes (Signed)
Physical Therapy Treatment Patient Details Name: [name] [name] MRN: [id number] DOB: 1956-01-30 Today's Date: 06/24/2013 Time: 1324-4010 PT Time Calculation (min): 25 min  PT Assessment / Plan / Recommendation  History of Present Illness Patient is a 57 yo female admitted with ischemic Rt leg.  Patient s/p Rt BKA.   PT Comments   Treatment limited to transfer to 3-in-1 2/2 nonsustained elevated HR.  Transfer to 3-in-1 HR increased to 153, return to bed HR increased to 228 on monitor.  RN aware of elevated HR.    Follow Up Recommendations  CIR     Does the patient have the potential to tolerate intense rehabilitation     Barriers to Discharge        Equipment Recommendations  Rolling walker with 5" wheels;Wheelchair (measurements PT);Wheelchair cushion (measurements PT)    Recommendations for Other Services    Frequency Min 3X/week   Progress towards PT Goals Progress towards PT goals: Progressing toward goals  Plan Frequency needs to be updated    Precautions / Restrictions Precautions Precautions: Fall Restrictions Weight Bearing Restrictions: No   Pertinent Vitals/Pain Indicates pain in residual limb.  Using PCA.      Mobility  Bed Mobility Bed Mobility: Supine to Sit;Sitting - Scoot to Edge of Bed;Sit to Supine Supine to Sit: 4: Min assist Sitting - Scoot to Edge of Bed: 5: Supervision Sit to Supine: 4: Min assist Details for Bed Mobility Assistance: cues for sequencing, safe technique.  pt moves slowly.   Transfers Transfers: Sit to Stand;Stand to [employer] Transfers Sit to Stand: 4: Min assist;With upper extremity assist;From bed;From chair/3-in-1 Stand to Sit: 4: Min assist;With upper extremity assist;To chair/3-in-1;To bed Stand Pivot Transfers: 3: Mod assist Details for Transfer Assistance: cues for use of UEs, sequencing through SPT.   Ambulation/Gait Ambulation/Gait Assistance: Not tested (comment) Stairs: No Wheelchair Mobility Wheelchair Mobility:  No    Exercises     PT Diagnosis:    PT Problem List:   PT Treatment Interventions:     PT Goals (current goals can now be found in the care plan section) Acute Rehab PT Goals Time For Goal Achievement: 07/07/13 Potential to Achieve Goals: Good  Visit Information  Last PT Received On: 06/24/13 Assistance Needed: +1 History of Present Illness: Patient is a 57 yo female admitted with ischemic Rt leg.  Patient s/p Rt BKA.    Subjective Data  Subjective: "You made my heart rate go up."   Cognition  Cognition Arousal/Alertness: Awake/alert Behavior During Therapy: Anxious;Impulsive Overall Cognitive Status: Within Functional Limits for tasks assessed    Balance  Balance Balance Assessed: Yes Static Sitting Balance Static Sitting - Balance Support: No upper extremity supported Static Sitting - Level of Assistance: 5: Stand by assistance  End of Session PT - End of Session Equipment Utilized During Treatment: Gait belt Activity Tolerance: Patient limited by pain Patient left: in bed;with call bell/phone within reach Nurse Communication: Mobility status   GP     Sunny Schlein, [place] [phone] 06/24/2013, 3:34 PM

## 2013-06-24 NOTE — Progress Notes (Signed)
Utilization review completed.  

## 2013-06-24 NOTE — Progress Notes (Signed)
[facility] Progress Note [place] TEAM 1 - Stepdown/ICU TEAM   Jamea Oceguera ZOX:[id number] DOB: 1955/11/09 DOA: 06/21/2013 PCP: No primary provider on file.  Admit HPI / Brief Narrative: 57 y.o. female with a Past Medical History of severe peripheral vascular disease status post femoropopliteal bypass last year, with a recent history of acute ischemic leg with thrombectomy just 2 days ago at [facility], discharged yesterday on Coumadin and aspirin who presented with worsening pain in the right foot and black stools for 1-2 days.     Apparently, since discharge from [facility], patient continued to have pain in the right foot area, overnight it worsened, as a result she presented to [facility] emergency room today, where she was found to have cold and pulseless right foot. She also gave a history of black tarry stools for the past 2 days. She was found to have a hemoglobin of 7.6, which was a significant drop from just a few days ago. [facility] hospitalist was contacted by [facility] ED, both vascular surgery and gastroenterology was also consulted, patient was then transferred to the step down unit here for further evaluation and management.  On reviewing the records from recent hospitalization at [facility], and also reviewing the operative note from vascular surgery, it looks like the patient's femoropopliteal graft is occluded, and they have attempted to remove clots 2 days ago with limited success. Patient was told by vascular surgery at [facility], that if the graft again reoccluded, she would require amputation. Patient was aware of this.  Patient is now being admitted, vascular surgery and GI consultation are in progress.   Assessment/Plan:  Ischemic leg - Right  -known hx of PVD s/p femoropopliteal bypass and recent thrombectomy for the occluded graft on 8/28 at [facility]. It was noted that if this fails she would require amputation.Patient was  admitted on 8/30 for worsening right leg pain. 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  -she has known PVD - may need anti-platelet agents/coumadin re-initiated once ok with GI   Upper GI bleed  -Patient on chronic Coumadin therapy for severe peripheral vascular disease and aspirin, unfortunately along with a right ischemic leg also had active melanotic stools with severe acute blood loss anemia.  -EGD on 8/30 - no foci to explain melanotic stools  -c/w PPI  -Hgb stable at this time    Ileus 2/2 right colonic constipation -change to clear liquids since no SB dilatation on XR (no indications for NPO at this time) -magnesium citrate x 1 bottle slowly over 12-18 hours to minimize nausea/vomiting - also give Dulcolax 10 mg PO  Q 12 -if no results in 24 hours will needs to repeat abd XR  Acute blood loss anemia  -Hb stable after 2 units of PRBC transfusion  -monitor periodically   Hx of PVD (peripheral vascular disease)  -Patient has a long-standing history of severe peripheral vascular disease-requiring numerous thrombectomies and ultimately a femoropopliteal bypass last year. Unfortunately, it looks like the femoropopliteal graft has now become occluded, only a few days ago in [facility], her vascular surgeon attempted to the open this graft but had very limited success. After reviewing the operative note from a vascular surgeon at [facility], it is clearly  stated that if the graft were to again close, patient would require a amputation -postop care per VVS  HTN / hypotension -currently off all antihypertensives - BP soft, likey due to narcotics - decrease dose of PCA due to heavy sedation   Diabetes  -no change in tx plan today    Mild hypokalemia -likely due to poor intake - supplement and follow   Code Status: FULL Family Communication: no family present at time of exam Disposition Plan: SDU  Consultants: Vasc Surgery GI - [facility]   Procedures: EGD - 8/30 R BKA - 8/31  Antibiotics: none  DVT prophylaxis: lovenox  HPI/Subjective: Pt awake and talking with CIR RN- ademant re: NOT going to CIR. Denies N/V but endorses abdominal fullness without pain. No CP or SOB.  Objective: Blood pressure 109/72, pulse 105, temperature 100.1 F (37.8 C), temperature source Oral, resp. rate 15, height 5\' 6"  (1.676 m), weight 76.4 kg (168 lb 6.9 oz), SpO2 95.00%.  Intake/Output Summary (Last 24 hours) at 06/24/13 1339 Last data filed at 06/24/13 0511  Gross per 24 hour  Intake 3513.33 ml  Output   2000 ml  Net 1513.33 ml   Exam: General: No acute respiratory distress Lungs: Clear to auscultation bilaterally without wheezes or crackles Cardiovascular: Regular rate and rhythm without murmur gallop or rub normal S1 and S2 Abdomen: Nontender,very distended,  tympanic,high pitched bowel sounds positive, no rebound, no ascites, no appreciable mass Extremities: No significant cyanosis, clubbing, or edema L lower extremity  Data Reviewed: Basic Metabolic Panel:  Recent Labs Lab 06/21/13 1520 06/22/13 1321 06/23/13 0535 06/24/13 0425  NA 142  --  138 137  K 3.4*  --  3.3* 4.6  CL 101  --  100 101  CO2 35*  --  31 31  GLUCOSE 98  --  184* 130*  BUN 8  --  6 11  CREATININE 1.08 0.95 1.12* 1.33*  CALCIUM 8.5  --  8.7 8.6   Liver Function Tests:  Recent Labs Lab 06/21/13 1520  AST 75*  ALT 27  ALKPHOS 38*  BILITOT 0.4  PROT 5.2*  ALBUMIN 2.8*   CBC:  Recent Labs Lab 06/21/13 1520 06/22/13 0135 06/22/13 1321 06/23/13 0535 06/24/13 0425  WBC 9.2 8.1 7.8 10.5 10.3  HGB 7.0* 9.1* 8.7* 9.0* 8.4*  HCT 20.8* 27.2* 25.8* 28.3* 25.8*  MCV 86.0 83.7 84.9 86.5 86.3  PLT 139* 147* 152 192 221    CBG:  Recent Labs Lab 06/23/13 1211 06/23/13 1635 06/23/13 2142 06/24/13 0718 06/24/13 1210  GLUCAP 157* 179* 171* 130* 171*    Recent Results (from the past 240 hour(s))  MRSA PCR SCREENING     Status: Abnormal   Collection Time    06/21/13 11:55 AM      Result Value Range Status   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 It is not     intended to diagnose MRSA     infection nor to guide or     monitor treatment for     MRSA infections.     RESULT CALLED TO, READ BACK BY AND VERIFIED WITH:     GOLDEN C,RN 1610 06/21/13 SCALES H  Studies:  Recent x-ray studies have been reviewed in detail by the Attending Physician  Scheduled Meds:  Scheduled Meds: . allopurinol  100 mg Oral Daily  . amitriptyline  50 mg Oral QHS  . atorvastatin  40 mg Oral q1800  . bisacodyl  10 mg Oral Once  . Chlorhexidine Gluconate Cloth  6 each Topical Q0600  . docusate sodium  100 mg Oral Daily  . DULoxetine  60 mg Oral Daily  . enoxaparin (LOVENOX) injection  40 mg Subcutaneous Q24H  . insulin aspart  0-9 Units Subcutaneous TID WC  . magnesium citrate  1 Bottle Oral Once  . morphine   Intravenous Q4H  . mupirocin ointment  1 application Nasal BID  . pantoprazole  40 mg Oral BID  . pregabalin  300 mg Oral BID  . sodium chloride  3 mL Intravenous Q12H  . warfarin  7.5 mg Oral ONCE-1800  . Warfarin - Pharmacist Dosing Inpatient   Does not apply q1800    Time spent on care of this patient: 35 mins   ELLIS,ALLISON L. ANP  [facility] Office  [phone] Pager - Text Page per Loretha Stapler as per below:  On-Call/Text Page:      [email or web address]      password [email or web address]  If 7PM-7AM, please contact night-coverage [email or web address] Password [email or web address] 06/24/2013, 1:39 PM   LOS: 3 days   I have examined the patient,  reviewed the chart and modified the above note which I agree with.   Jorge Amparo,MD [phone] 06/24/2013, 6:40 PM

## 2013-06-24 NOTE — Progress Notes (Signed)
Heart rate continues to jump up into the 160's. Have notified Dr. Butler Denmark. Also bladder scanned the patient and saw over 200 cc's. Will continue to monitor the patient.

## 2013-06-24 NOTE — Progress Notes (Signed)
ANTICOAGULATION CONSULT NOTE - Follow Up Consult  Pharmacy Consult for Warfarin Indication: Severe PVD/ischemia  No Known Allergies  Labs:  Recent Labs  06/21/13 1520  06/22/13 1321 06/23/13 0535 06/24/13 0425  HGB 7.0*  < > 8.7* 9.0* 8.4*  HCT 20.8*  < > 25.8* 28.3* 25.8*  PLT 139*  < > 152 192 221  LABPROT 13.3  --   --  13.0 14.2  INR 1.03  --   --  1.00 1.12  CREATININE 1.08  --  0.95 1.12* 1.33*  < > = values in this interval not displayed.  Estimated Creatinine Clearance: 48.1 ml/min (by C-G formula based on Cr of 1.33).   Assessment: 57 yo F with recent complicated history.  Patient is s/p thrombolysis procedure at [facility] for occluded femoropopliteal bypass with limited success, discharged on 8/29.  She then returned to [facility] ER on 8/30 with pain in right foot.  Right foot was found to be pulseless and ischemic. Transferred to [facility], she is now s/p R-BKA on 8/31.  Complicating the issue, patient also reports 1-2 days of melanotic stools with acute blood loss anemia.  She is now s/p blood transfusion, which showed an appropriate rise in H/H.  Furthermore, EGD found no foci to explain melanotic stools.  Pharmacy was consulted for restarting coumadin therapy and patient is currently on day#3 of re-initiation.  Patient is only on a VTE prophylactic dose of enoxaparin, not a treatment dose bridge.  INR = 1.12  Goal of Therapy:  INR 2-3 Monitor platelets by anticoagulation protocol: Yes   Plan:  - Coumadin 7.5mg  PO x1 dose tonight - daily INR, and for this patient daily CBC - monitor for s/s of bleeding  Thank you. Okey Regal, PharmD [phone]  06/24/2013 8:42 AM

## 2013-06-24 NOTE — Progress Notes (Signed)
VASCULAR PROGRESS NOTE  SUBJECTIVE: Pain adequately controlled.  PHYSICAL EXAM: Filed Vitals:   06/24/13 0301 06/24/13 0435 06/24/13 0800 06/24/13 1035  BP:  105/66 109/72   Pulse:   105   Temp:  100 F (37.8 C) 100.1 F (37.8 C)   TempSrc:  Oral    Resp: 15  19 15   Height:      Weight:  168 lb 6.9 oz (76.4 kg)    SpO2: 99%  96% 95%   I inspected her right BKA and this looks good so far.   LABS: Lab Results  Component Value Date   WBC 10.3 06/24/2013   HGB 8.4* 06/24/2013   HCT 25.8* 06/24/2013   MCV 86.3 06/24/2013   PLT 221 06/24/2013   Lab Results  Component Value Date   CREATININE 1.33* 06/24/2013   Lab Results  Component Value Date   INR 1.12 06/24/2013   CBG (last 3)   Recent Labs  06/23/13 2142 06/24/13 0718 06/24/13 1210  GLUCAP 171* 130* 171*    Principal Problem:   Ischemic leg Active Problems:   GI bleed   Acute blood loss anemia   PVD (peripheral vascular disease)   HTN (hypertension)   Diabetes  ASSESSMENT AND PLAN:  * 2 Days Post-Op s/p: Right BKA. I am concerned about her perfusion to the BKA, but so far things look good.   * CIR consult.   Cari Caraway Beeper: [phone] 06/24/2013

## 2013-06-24 NOTE — Progress Notes (Signed)
I met with patient at bedside to discuss her rehab options. I explained that inpt rehab is not a SNF for she is adamently refusing any discussions about SNF placement. She lives at home with her 58 yo old grandson and until 3 days ago her son also was there to assist. Son is now in jail. Neighbor is caring for 58 yo old grandson, Fayrene Fearing and pt plans to go home with neighbor at d/c who can provide 24/7 assist. I will follow up after another therapy session and further discussions with pt for rehab options. [phone]

## 2013-06-25 LAB — TYPE AND SCREEN
Antibody Screen: NEGATIVE
Unit division: 0
Unit division: 0

## 2013-06-25 LAB — BASIC METABOLIC PANEL
BUN: 13 mg/dL (ref 6–23)
CO2: 25 mEq/L (ref 19–32)
Chloride: 104 mEq/L (ref 96–112)
GFR calc Af Amer: 58 mL/min — ABNORMAL LOW (ref >90)
GFR calc non Af Amer: 55 mL/min — ABNORMAL LOW (ref >90)
Glucose, Bld: 186 mg/dL — ABNORMAL HIGH (ref 70–99)
Potassium: 4.8 mEq/L (ref 3.5–5.1)
Potassium: 5.3 mEq/L — ABNORMAL HIGH (ref 3.5–5.1)
Sodium: 135 mEq/L (ref 135–145)

## 2013-06-25 LAB — URINE MICROSCOPIC-ADD ON

## 2013-06-25 LAB — URINALYSIS, ROUTINE W REFLEX MICROSCOPIC
Leukocytes, UA: NEGATIVE
Nitrite: NEGATIVE
Specific Gravity, Urine: 1.008 (ref 1.005–1.030)
Urobilinogen, UA: 1 mg/dL (ref 0.0–1.0)
pH: 7 (ref 5.0–8.0)

## 2013-06-25 LAB — CBC
Hemoglobin: 8.7 g/dL — ABNORMAL LOW (ref 12.0–15.0)
MCH: 28.2 pg (ref 26.0–34.0)
RBC: 3.09 MIL/uL — ABNORMAL LOW (ref 3.87–5.11)

## 2013-06-25 LAB — GLUCOSE, CAPILLARY: Glucose-Capillary: 116 mg/dL — ABNORMAL HIGH (ref 70–99)

## 2013-06-25 MED ORDER — VANCOMYCIN HCL IN DEXTROSE 750-5 MG/150ML-% IV SOLN
750.0000 mg | Freq: Two times a day (BID) | INTRAVENOUS | Status: DC
  Administered 2013-06-25 – 2013-06-29 (×8): 750 mg via INTRAVENOUS
  Filled 2013-06-25 (×8): qty 150

## 2013-06-25 MED ORDER — PIPERACILLIN-TAZOBACTAM 3.375 G IVPB
3.3750 g | Freq: Three times a day (TID) | INTRAVENOUS | Status: DC
  Administered 2013-06-25 – 2013-06-30 (×15): 3.375 g via INTRAVENOUS
  Filled 2013-06-25 (×18): qty 50

## 2013-06-25 MED ORDER — WARFARIN SODIUM 5 MG PO TABS
5.0000 mg | ORAL_TABLET | Freq: Once | ORAL | Status: DC
  Filled 2013-06-25 (×2): qty 1

## 2013-06-25 NOTE — Progress Notes (Signed)
ANTIBIOTIC CONSULT NOTE - INITIAL  Pharmacy Consult for vancomycin and Zosyn Indication: Unknown fever source with recent surgery  No Known Allergies  Patient Measurements: Height: 5\' 6"  (167.6 cm) Weight: 163 lb (73.936 kg) IBW/kg (Calculated) : 59.3   Vital Signs: Temp: 103.1 F (39.5 C) (09/03 1508) Temp src: Oral (09/03 1508) BP: 137/68 mmHg (09/03 1508) Pulse Rate: 124 (09/03 1508) Intake/Output from previous day: 09/02 0701 - 09/03 0700 In: 2340 [P.O.:840; I.V.:1500] Out: 2975 [Urine:2975] Intake/Output from this shift: Total I/O In: 500 [I.V.:500] Out: -   Labs:  Recent Labs  06/24/13 0425 06/24/13 2134 06/25/13 0905  WBC 10.3 11.3* 15.6*  HGB 8.4* 8.3* 8.7*  PLT 221 237 254  CREATININE 1.33* 1.17* 1.09   Estimated Creatinine Clearance: 57.8 ml/min (by C-G formula based on Cr of 1.09). No results found for this basename: VANCOTROUGH, Leodis Binet, VANCORANDOM, GENTTROUGH, GENTPEAK, GENTRANDOM, TOBRATROUGH, TOBRAPEAK, TOBRARND, AMIKACINPEAK, AMIKACINTROU, AMIKACIN,  in the last 72 hours   Microbiology: Recent Results (from the past 720 hour(s))  MRSA PCR SCREENING     Status: Abnormal   Collection Time    06/21/13 11:55 AM      Result Value Range Status   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 It is not     intended to diagnose MRSA     infection nor to guide or     monitor treatment for     MRSA infections.     RESULT CALLED TO, READ BACK BY AND VERIFIED WITH:     GOLDEN C,RN 1610 06/21/13 SCALES H   Assessment: 57 YOF who is spiking fevers after fempop bypass and R-BKA. Tmax 103.1/24hr and WBC 15.6. Blood cultures have been drawn, urine cultures to be drawn- will schedule antibiotics after all cultures have been obtained. Patient is MRSA PCR positive. Renal function is  stable.  Goal of Therapy:  Vancomycin trough level 15-20 mcg/ml  Plan:  1. Vancomycin 750mg  IV Q12h starting at 1800 2. Zosyn 3.375gm IV Q8h starting at 2000 3. Follow up cultures/sensitivities, fever curve, WBC, clinical progression 4. Follow renal function and vanc trough when clinically indicated  Wileen Duncanson D. Vanna Sailer, PharmD Clinical Pharmacist Pager: [phone] 06/25/2013 4:30 PM

## 2013-06-25 NOTE — Progress Notes (Signed)
ANTICOAGULATION CONSULT NOTE - Follow Up Consult  Pharmacy Consult for Warfarin Indication: Severe PVD/ischemia  No Known Allergies  Labs:  Recent Labs  06/23/13 0535 06/24/13 0425 06/24/13 2134 06/25/13 0425  HGB 9.0* 8.4* 8.3*  --   HCT 28.3* 25.8* 25.9*  --   PLT 192 221 237  --   LABPROT 13.0 14.2  --  18.6*  INR 1.00 1.12  --  1.60*  CREATININE 1.12* 1.33* 1.17*  --     Estimated Creatinine Clearance: 53.9 ml/min (by C-G formula based on Cr of 1.17).   Assessment: 57 yo F with recent complicated history.  Patient is s/p thrombolysis procedure at [facility] for occluded femoropopliteal bypass with limited success, discharged on 8/29.  She then returned to [facility] ER on 8/30 with pain in right foot.  Right foot was found to be pulseless and ischemic. Transferred to [facility], she is now s/p R-BKA on 8/31.  Complicating the issue, patient also reports 1-2 days of melanotic stools with acute blood loss anemia.  She is now s/p blood transfusion, which showed an appropriate rise in H/H.  Furthermore, EGD found no foci to explain melanotic stools.  Pharmacy was consulted for restarting coumadin therapy.  Patient is only on a VTE prophylactic dose of enoxaparin, not a treatment dose bridge.  INR = 1.6  Goal of Therapy:  INR 2-3 Monitor platelets by anticoagulation protocol: Yes   Plan:  - Coumadin 5 mg PO x1 dose tonight - daily INR, and for this patient daily CBC - monitor for s/s of bleeding  Thank you. Okey Regal, PharmD [phone]  06/25/2013 8:57 AM

## 2013-06-25 NOTE — Progress Notes (Signed)
Noted temp and HR issues. I will follow for medical readiness and insurance decision for possible admit to inpt rehab. [phone]

## 2013-06-25 NOTE — Progress Notes (Signed)
[facility] Progress Note [place] TEAM 1 - Stepdown/ICU TEAM   [name] [name] ZOX:[id number] DOB: 11/06/1955 DOA: 06/21/2013 PCP: No primary provider on file.  Admit HPI / Brief Narrative: 57 y.o. female with a Past Medical History of severe peripheral vascular disease status post femoropopliteal bypass last year, with a recent history of acute ischemic leg with thrombectomy just 2 days ago at [facility], discharged yesterday on Coumadin and aspirin who presented with worsening pain in the right foot and black stools for 1-2 days.     Apparently, since discharge from [facility], patient continued to have pain in the right foot area, overnight it worsened, as a result she presented to [facility] emergency room today, where she was found to have cold and pulseless right foot. She also gave a history of black tarry stools for the past 2 days. She was found to have a hemoglobin of 7.6, which was a significant drop from just a few days ago. [facility] hospitalist was contacted by [facility] ED, both vascular surgery and gastroenterology was also consulted, patient was then transferred to the step down unit here for further evaluation and management.  On reviewing the records from recent hospitalization at [facility], and also reviewing the operative note from vascular surgery, it looks like the patient's femoropopliteal graft is occluded, and they have attempted to remove clots 2 days ago with limited success. Patient was told by vascular surgery at [facility], that if the graft again reoccluded, she would require amputation. Patient was aware of this.    Assessment/Plan:  Ischemic leg - Right /known severe PVD -known hx of PVD s/p femoropopliteal bypass and recent thrombectomy for the occluded graft on 8/28 at [facility]. She was informed that if this fails she would require amputation-admitted on 8/30 for worsening right leg pain/ pulseless and cold right foot.  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  -she has known PVD - may need anti-platelet agents/coumadin re-initiated once ok with GI  -VVS remains concerned over potential perfusion issues to her BKA   Upper GI bleed  -Patient on chronic Coumadin therapy for severe peripheral vascular disease and aspirin, unfortunately along with a right ischemic leg also had active melanotic stools with severe acute blood loss anemia.  -EGD on 8/30 - no foci to explain melanotic stools  -c/w PPI  -Hgb stable at this time    Fever -onset overnight (9/2-9/3) and again after 3p today to 103- RN instructed to I/O cath if necessary for urine specimen- will ask pharmacy to dose Vanco and Zosyn once urine obtained -FU blood cx's- no respiratory sx's -associated with sinus tachy which improved once defervesced -since recurrent fever and K up will increase IVF to 125/hr  Ileus 2/2 right colonic constipation -resolved with multiple stools after 1 bottle mg citrate -advance diet - continue Dulcolax 10 mg PO  Q 12  Acute blood loss anemia  -Hb stable after 2 units of PRBC transfusion - ~8.7 -monitor periodically   HTN / hypotension -currently off all antihypertensives - BP soft, likey due to narcotics - decrease dose of PCA due to heavy sedation   Diabetes  -no change in tx plan today   Mild hypokalemia -likely due to poor intake - supplement and follow -now mildly hyperkalemic and suspect due to  fever/DH   Code Status: FULL Family Communication: no family present at time of exam Disposition Plan: Transfer to telemetry-CIR when medically stable  Consultants: Vasc Surgery GI - [facility]   Procedures: EGD - 8/30 R BKA - 8/31  Antibiotics: none  DVT prophylaxis: lovenox  HPI/Subjective: Pt on  BSC. Still with phantom limb pain- agreeable to CIR now. Denies cp, sob, abdom pain.  Objective: Blood pressure 148/71, pulse 113, temperature 99.2 F (37.3 C), temperature source Oral, resp. rate 21, height 5\' 6"  (1.676 m), weight 73.8 kg (162 lb 11.2 oz), SpO2 95.00%.  Intake/Output Summary (Last 24 hours) at 06/25/13 1237 Last data filed at 06/25/13 1000  Gross per 24 hour  Intake   2840 ml  Output   2975 ml  Net   -135 ml   Exam: General: No acute respiratory distress Lungs: Clear to auscultation bilaterally without wheezes or crackles Cardiovascular: Regular rate and rhythm without murmur gallop or rub normal S1 and S2 Abdomen: Nontender,non distended, positive bowel sounds, no rebound, no ascites, no appreciable mass Extremities: No significant cyanosis, clubbing, or edema L lower extremity - surgical wounds dressed and dry  Data Reviewed: Basic Metabolic Panel:  Recent Labs Lab 06/22/13 0135 06/22/13 1321 06/23/13 0535 06/24/13 0425 06/24/13 2134 06/25/13 0905  NA 141  --  138 137 139 135  K 3.5  --  3.3* 4.6 4.8 5.3*  CL 102  --  100 101 104 101  CO2 32  --  31 31 26 25   GLUCOSE 67*  --  184* 130* 106* 186*  BUN 7  --  6 11 13 13   CREATININE 1.04 0.95 1.12* 1.33* 1.17* 1.09  CALCIUM 8.5  --  8.7 8.6 8.3* 8.5  MG  --   --   --   --  2.4  --    Liver Function Tests:  Recent Labs Lab 06/21/13 1520 06/22/13 0135  AST 75* 98*  ALT 27 32  ALKPHOS 38* 38*  BILITOT 0.4 0.4  PROT 5.2* 5.3*  ALBUMIN 2.8* 2.7*   CBC:  Recent Labs Lab 06/22/13 1321 06/23/13 0535 06/24/13 0425 06/24/13 2134 06/25/13 0905  WBC 7.8 10.5 10.3 11.3* 15.6*  NEUTROABS  --   --   --  7.9*  --   HGB 8.7* 9.0* 8.4* 8.3* 8.7*  HCT 25.8* 28.3* 25.8* 25.9* 26.8*  MCV 84.9 86.5 86.3 86.9 86.7  PLT 152 192 221 237 254   CBG:  Recent Labs Lab 06/24/13 0718 06/24/13 1210 06/24/13 1702 06/24/13 2217 06/25/13 0718  GLUCAP 130* 171* 135* 105* 133*    Recent Results (from the  past 240 hour(s))  MRSA PCR SCREENING     Status: Abnormal   Collection Time    06/21/13 11:55 AM      Result Value Range Status   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 It is not     intended to diagnose MRSA     infection nor to guide or     monitor treatment for     MRSA infections.     RESULT CALLED TO, READ BACK BY AND VERIFIED WITH:     GOLDEN C,RN 1610 06/21/13 SCALES H  Studies:  Recent x-ray studies have been reviewed in detail by the Attending Physician  Scheduled Meds:  Scheduled Meds: . allopurinol  100 mg Oral Daily  . amitriptyline  50 mg Oral QHS  . atorvastatin  40 mg Oral q1800  . bisacodyl  10 mg Oral Q12H  . Chlorhexidine Gluconate Cloth  6 each Topical Q0600  . docusate sodium  100 mg Oral Daily  . DULoxetine  60 mg Oral Daily  . enoxaparin (LOVENOX) injection  40 mg Subcutaneous Q24H  . insulin aspart  0-9 Units Subcutaneous TID WC  . morphine   Intravenous Q4H  . mupirocin ointment  1 application Nasal BID  . pantoprazole  40 mg Oral BID  . pregabalin  300 mg Oral BID  . sodium chloride  3 mL Intravenous Q12H  . warfarin  5 mg Oral ONCE-1800  . Warfarin - Pharmacist Dosing Inpatient   Does not apply q1800    Time spent on care of this patient: 35 mins   ELLIS,ALLISON L. ANP  [facility] Office  [phone] Pager - Text Page per Loretha Stapler as per below:  On-Call/Text Page:      [email or web address]      password [email or web address]  If 7PM-7AM, please contact night-coverage [email or web address] Password [email or web address] 06/25/2013, 12:37 PM   LOS: 4 days   I have personally examined this patient and reviewed the entire database. I have reviewed the above note, made any necessary editorial changes, and agree with its content.  Lonia Blood, MD [facility]

## 2013-06-25 NOTE — Progress Notes (Addendum)
Pt to TX to 6N-25 tele, called report. Changed dressing; surgical site looks good, staples, min shadowing on old dsg. Encouraged I/S & TCDB; will continue to monitor. Called contact person for Pt.

## 2013-06-25 NOTE — Progress Notes (Signed)
Pt's HR up to 180s-200s and T 101.8 NP notified and is at the bedside. New orders received. Will continue to monitor.

## 2013-06-25 NOTE — Progress Notes (Signed)
Occupational Therapy Treatment Patient Details Name: [name] [name] MRN: [id number] DOB: 10/02/1956 Today's Date: 06/25/2013 Time: 0932-1000 OT Time Calculation (min): 28 min  OT Assessment / Plan / Recommendation  History of present illness Patient is a 57 yo female admitted with ischemic Rt leg.  Patient s/p Rt BKA.   OT comments  Pt making progress towards functional goals, motivated to be as independent as possible. Pt demos decreased safety/impulsivity during ADL.funcitnal mobility. Pt required +2 A to transition from [facility] to bed using RW  Follow Up Recommendations  CIR    Barriers to Discharge   Pt lives at home with 87 y/o grandson that she care for    Equipment Recommendations  3 in 1 bedside comode;Tub/shower bench    Recommendations for Other Services    Frequency Min 3X/week   Progress towards OT Goals Progress towards OT goals: Not progressing toward goals - comment  Plan      Precautions / Restrictions Precautions Precautions: Fall Restrictions Weight Bearing Restrictions: No   Pertinent Vitals/Pain 6/10 R LE    ADL  Grooming: Performed;Set up;Wash/dry face;Wash/dry hands Upper Body Bathing: Set up;Performed Lower Body Bathing: Minimal assistance;Moderate assistance;Performed Upper Body Dressing: Performed;Minimal assistance;Other (comment) (min A due to IV lines R UE) Lower Body Dressing: Maximal assistance;Performed Toilet Transfer: Other (comment);Performed;Minimal assistance;Moderate assistance (socks) Toilet Transfer Method: Sit to stand;Stand pivot Toilet Transfer Equipment: Bedside commode Toileting - Clothing Manipulation and Hygiene: Minimal assistance;Moderate assistance Where Assessed - Toileting Clothing Manipulation and Hygiene: Sit on 3-in-1 or toilet;Lean right and/or left Tub/Shower Transfer Method: Not assessed Equipment Used: Gait belt;Rolling walker;Other (comment) (BSC) Transfers/Ambulation Related to ADLs: pt required cues for correct hand  placement, safety ADL Comments: Pt very anxious about doing ADLs for herself    OT Diagnosis:    OT Problem List:   OT Treatment Interventions:     OT Goals(current goals can now be found in the care plan section)    Visit Information  Last OT Received On: 06/25/13 Assistance Needed: +2 PT/OT Co-Evaluation/Treatment: Yes History of Present Illness: Patient is a 57 yo female admitted with ischemic Rt leg.  Patient s/p Rt BKA.    Subjective Data      Prior Functioning       Cognition  Cognition Arousal/Alertness: Awake/alert Behavior During Therapy: Anxious;Impulsive Overall Cognitive Status: Within Functional Limits for tasks assessed    Mobility  Bed Mobility Bed Mobility: Scooting to HOB;Sit to Supine Sit to Supine: 4: Min guard Scooting to [facility]: 6: Modified independent (Device/Increase time) Transfers Transfers: Sit to Stand;Stand to Sit Sit to Stand: 4: Min assist;With upper extremity assist;From bed;From chair/3-in-1 Stand to Sit: 4: Min assist;With upper extremity assist;To chair/3-in-1;To bed Details for Transfer Assistance: cues for safety, correct hand placement. Pt required sitting down on BSC in midst of ambulation from [facility] to bed with RW    Exercises      Balance Balance Balance Assessed: Yes Dynamic Sitting Balance Dynamic Sitting - Balance Support: No upper extremity supported;During functional activity;Feet unsupported Dynamic Sitting - Level of Assistance: 5: Stand by assistance   End of Session OT - End of Session Equipment Utilized During Treatment: Gait belt;Other (comment) (RW, BSC) Activity Tolerance: Patient tolerated treatment well Patient left: in bed;with call bell/phone within reach;with nursing/sitter in room  GO     Galen Manila 06/25/2013, 10:44 AM

## 2013-06-25 NOTE — Progress Notes (Signed)
Physical Therapy Treatment Patient Details Name: [name] [name] [name] MRN: [id number] DOB: 1955/12/08 Today's Date: 06/25/2013 Time: 0932-1000 PT Time Calculation (min): 28 min  PT Assessment / Plan / Recommendation  History of Present Illness Patient is a 57 yo female admitted with ischemic Rt leg.  Patient s/p Rt BKA.   PT Comments   Pt admitted with right BKA. Pt currently with functional limitations due to pain in right LE as well as weakness and poor postural stability.   Pt will benefit from skilled PT to increase their independence and safety with mobility to allow discharge to the venue listed below.   Follow Up Recommendations  CIR                 Equipment Recommendations  Rolling walker with 5" wheels;Wheelchair (measurements PT);Wheelchair cushion (measurements PT)    Recommendations for Other Services Rehab consult  Frequency Min 3X/week   Progress towards PT Goals Progress towards PT goals: Progressing toward goals  Plan Current plan remains appropriate    Precautions / Restrictions Precautions Precautions: Fall Restrictions Weight Bearing Restrictions: No   Pertinent Vitals/Pain VSS, soreness right BKA    Mobility  Bed Mobility Bed Mobility: Sit to Supine Supine to Sit: Not tested (comment) Sitting - Scoot to Edge of Bed: Not tested (comment) Sit to Supine: 4: Min assist Scooting to [facility]: 4: Min assist Details for Bed Mobility Assistance: cues for sequencing, safe technique.  pt moves slowly.   Transfers Transfers: Sit to Stand;Stand to [employer] Transfers Sit to Stand: 4: Min assist;With upper extremity assist;From bed;From chair/3-in-1 Stand to Sit: 4: Min assist;With upper extremity assist;To chair/3-in-1;To bed Stand Pivot Transfers: 3: Mod assist Details for Transfer Assistance: cues for safety, correct hand placement. Pt required sitting down on BSC in midst of ambulation from [facility] to bed with RW Ambulation/Gait Ambulation/Gait Assistance: 1: +2 Total  assist Ambulation/Gait: Patient Percentage: 60% Ambulation Distance (Feet): 4 Feet Assistive device: Rolling walker Ambulation/Gait Assistance Details: Pt ambulated with RW a few steps from 3N1 to bed.  Needed lots of cues for upright stance as pt leans forward significantly.  Pt was able to use UEs to get a few good push ups to move her left LE but fatigues quickly and then needs incr assist for safety and postural stability.   Gait Pattern: Step-to pattern;Decreased stride length;Trunk flexed Gait velocity: decreased Stairs: No Wheelchair Mobility Wheelchair Mobility: No    Exercises Amputee Exercises Hip Flexion/Marching: AROM;Right;10 reps;Seated Knee Flexion: AROM;Right;10 reps;Seated Knee Extension: AROM;Right;10 reps;Seated Other Exercises Other Exercises: encouraged full knee extension.pt holding in flexed position    PT Goals (current goals can now be found in the care plan section)    Visit Information  Last PT Received On: 06/25/13 Assistance Needed: +2 PT/OT Co-Evaluation/Treatment: Yes History of Present Illness: Patient is a 57 yo female admitted with ischemic Rt leg.  Patient s/p Rt BKA.    Subjective Data  Subjective: "I need to use the bathroom."   Cognition  Cognition Arousal/Alertness: Awake/alert Behavior During Therapy: Anxious;Impulsive Overall Cognitive Status: Within Functional Limits for tasks assessed    Balance  Balance Balance Assessed: Yes Static Sitting Balance Static Sitting - Balance Support: No upper extremity supported Static Sitting - Level of Assistance: 5: Stand by assistance Static Sitting - Comment/# of Minutes: 4 Dynamic Sitting Balance Dynamic Sitting - Balance Support: No upper extremity supported;During functional activity;Feet unsupported Dynamic Sitting - Level of Assistance: 5: Stand by assistance Static Standing Balance Static Standing - Balance Support:  Bilateral upper extremity supported;During functional  activity Static Standing - Level of Assistance: 3: Mod assist Static Standing - Comment/# of Minutes: 3  End of Session PT - End of Session Equipment Utilized During Treatment: Gait belt Activity Tolerance: Patient limited by pain Patient left: in bed;with call bell/phone within reach Nurse Communication: Mobility status        [name] [name] [name] [name] [name] [name] 06/25/2013, 12:03 PM [facility] Rehabilitation [phone] (pager)

## 2013-06-26 ENCOUNTER — Inpatient Hospital Stay (HOSPITAL_COMMUNITY): Payer: PRIVATE HEALTH INSURANCE

## 2013-06-26 LAB — BASIC METABOLIC PANEL
BUN: 14 mg/dL (ref 6–23)
BUN: 12 mg/dL (ref 6–23)
Calcium: 8.5 mg/dL (ref 8.4–10.5)
Chloride: 100 mEq/L (ref 96–112)
Creatinine, Ser: 1.11 mg/dL — ABNORMAL HIGH (ref 0.50–1.10)
GFR calc Af Amer: 62 mL/min — ABNORMAL LOW (ref >90)
GFR calc Af Amer: 58 mL/min — ABNORMAL LOW (ref >90)
GFR calc non Af Amer: 50 mL/min — ABNORMAL LOW (ref >90)
Potassium: 4.9 mEq/L (ref 3.5–5.1)
Sodium: 134 mEq/L — ABNORMAL LOW (ref 135–145)

## 2013-06-26 LAB — URINE MICROSCOPIC-ADD ON

## 2013-06-26 LAB — URINALYSIS, ROUTINE W REFLEX MICROSCOPIC
Bilirubin Urine: NEGATIVE
Glucose, UA: NEGATIVE mg/dL
Ketones, ur: NEGATIVE mg/dL
Protein, ur: NEGATIVE mg/dL

## 2013-06-26 LAB — PROTIME-INR
INR: 1.86 — ABNORMAL HIGH (ref 0.00–1.49)
Prothrombin Time: 20.9 seconds — ABNORMAL HIGH (ref 11.6–15.2)

## 2013-06-26 LAB — GLUCOSE, CAPILLARY: Glucose-Capillary: 108 mg/dL — ABNORMAL HIGH (ref 70–99)

## 2013-06-26 LAB — CBC
HCT: 27.2 % — ABNORMAL LOW (ref 36.0–46.0)
MCHC: 32 g/dL (ref 30.0–36.0)
MCV: 86.6 fL (ref 78.0–100.0)
Platelets: 333 10*3/uL (ref 150–400)
RDW: 16.7 % — ABNORMAL HIGH (ref 11.5–15.5)
WBC: 14.1 10*3/uL — ABNORMAL HIGH (ref 4.0–10.5)

## 2013-06-26 MED ORDER — SODIUM CHLORIDE 0.45 % IV SOLN
INTRAVENOUS | Status: DC
  Administered 2013-06-26: 10:00:00 via INTRAVENOUS

## 2013-06-26 MED ORDER — WARFARIN SODIUM 5 MG PO TABS
5.0000 mg | ORAL_TABLET | Freq: Once | ORAL | Status: AC
  Administered 2013-06-26: 5 mg via ORAL
  Filled 2013-06-26: qty 1

## 2013-06-26 MED ORDER — SODIUM CHLORIDE 0.9 % IV SOLN
INTRAVENOUS | Status: DC
  Administered 2013-06-26: 16:00:00 via INTRAVENOUS

## 2013-06-26 MED ORDER — LORAZEPAM 1 MG PO TABS
1.0000 mg | ORAL_TABLET | Freq: Once | ORAL | Status: AC
  Administered 2013-06-26: 1 mg via ORAL
  Filled 2013-06-26: qty 1

## 2013-06-26 NOTE — Progress Notes (Signed)
Noted temp issues and pt still on PCA for pain. I will follow her progress as we await medical readiness to admit to inpt rehab. [phone]

## 2013-06-26 NOTE — Progress Notes (Signed)
ANTICOAGULATION CONSULT NOTE - Follow Up Consult  Pharmacy Consult:  Coumadin Indication:  Severe PVD / ischemia  No Known Allergies  Patient Measurements: Height: 5\' 6"  (167.6 cm) Weight: 163 lb (73.936 kg) IBW/kg (Calculated) : 59.3  Vital Signs: Temp: 101.1 F (38.4 C) (09/04 0611) Temp src: Oral (09/03 2120) BP: 120/59 mmHg (09/04 0611) Pulse Rate: 125 (09/04 0611)  Labs:  Recent Labs  06/24/13 0425 06/24/13 2134 06/25/13 0425 06/25/13 0905 06/26/13 0505  HGB 8.4* 8.3*  --  8.7* 8.7*  HCT 25.8* 25.9*  --  26.8* 27.2*  PLT 221 237  --  254 333  LABPROT 14.2  --  18.6*  --  20.9*  INR 1.12  --  1.60*  --  1.86*  CREATININE 1.33* 1.17*  --  1.09 1.11*    Estimated Creatinine Clearance: 56.8 ml/min (by C-G formula based on Cr of 1.11).       Assessment: 8 YOF recently admitted to [facility] where she underwent thrombolysis due to occluded fem-pop bypass (done in 2013).  She was discharged home on ASA and Coumadin on 06/20/13.  Patient presented to [facility] the following day with complaints of worsening pain in the right foot and black stools for 1-2 days.  Currently on Lovenox and Coumadin for severe PVD and ischemia.  INR sub-therapeutic but is trending up.  Patient's Coumadin dose was not charted as given last night - unable to clarify with RN.  Her renal function is fluctuating but remains relatively stable.  No bleeding reported.   Goal of Therapy:  INR 2-3 Monitor platelets by anticoagulation protocol: Yes Vanc trough 15-20 mcg/mL    Plan:  - Coumadin 5mg  PO today - Continue Lovenox 40mg  SQ Q24H until INR therapeutic - Daily PT / INR - Vanc 750mg  IV Q12H - Zosyn 3.375gm IV Q8H, 4 hr infusion - Monitor renal fxn, clinical course, vanc trough as indicated - F/U remove KCL from IVF    Guillermina Shaft D. Laney Potash, PharmD, BCPS Pager:  [phone] 06/26/2013, 9:13 AM

## 2013-06-26 NOTE — Progress Notes (Signed)
VASCULAR PROGRESS NOTE  SUBJECTIVE: Seems a bit confused.  PHYSICAL EXAM: Filed Vitals:   06/26/13 0816 06/26/13 0929 06/26/13 0953 06/26/13 1450  BP:  127/90  124/70  Pulse:  120  124  Temp:  100.2 F (37.9 C)  101.3 F (38.5 C)  TempSrc:  Oral  Oral  Resp: 16 20 16 17   Height:      Weight:      SpO2: 95% 93% 96% 92%   I inspected her right BKA.  So far it looks ok. No erythema or drainage.   LABS: Lab Results  Component Value Date   WBC 14.1* 06/26/2013   HGB 8.7* 06/26/2013   HCT 27.2* 06/26/2013   MCV 86.6 06/26/2013   PLT 333 06/26/2013   Lab Results  Component Value Date   CREATININE 1.17* 06/26/2013   Lab Results  Component Value Date   INR 1.86* 06/26/2013   CBG (last 3)   Recent Labs  06/25/13 2338 06/26/13 0728 06/26/13 1152  GLUCAP 120* 95 137*    Principal Problem:   Ischemic leg Active Problems:   GI bleed   Acute blood loss anemia   PVD (peripheral vascular disease)   HTN (hypertension)   Diabetes   Ileus   ASSESSMENT AND PLAN:  * 4 Days Post-Op s/p: right BKA  * I will be off this coming weekend. If there are any problems, Dr. Imogene Burn is on call.   Cari Caraway Beeper: [phone] 06/26/2013

## 2013-06-26 NOTE — Progress Notes (Addendum)
Patient ID: [name] [name], female   DOB: [birth year], 57 y.o.   MRN: [id number] [facility] PROGRESS NOTE  [name] [name] WUJ:[id number] DOB: [birth year] DOA: 06/21/2013 PCP: No primary provider on file.  Admit HPI / Brief Narrative:  57 y.o. female with severe peripheral vascular disease status post femoropopliteal bypass last year, with a recent history of acute ischemic leg with thrombectomy just 2 days ago at [facility], discharged on Coumadin and aspirin who presented with worsening pain in the right foot and black stools for 1-2 days. Apparently, since discharge from [facility], patient continued to have pain in the right foot area and as a result she presented to [facility] emergency room where she was found to have cold and pulseless right foot. She was found to have a hemoglobin of 7.6, which was a significant drop from just a several days prior to this admission. [facility] hospitalist was contacted by [facility] ED, both vascular surgery and gastroenterology was also consulted, patient was then transferred to the step down unit for further evaluation and management.   On reviewing the records from recent hospitalization at [facility], and also reviewing the operative note from vascular surgery, it looks like the patient's femoropopliteal graft is occluded, and they have attempted to remove clots 2 days prior to this admission with limited success.   Assessment/Plan:  Ischemic leg - Right /known severe PVD  - known hx of PVD s/p femoropopliteal bypass and recent thrombectomy for the occluded graft on 8/28 at [facility].  - she was admitted on 8/30 for worsening right leg pain/ pulseless and cold right foot - pt is now status post R BKA on 8/31  - due to concurrent UGI bleed with acute blood loss anemia, she was not started on IV Heparin after weighing risk vs benefits.  - she has known PVD - may need anti-platelet agents/coumadin re-initiated once ok with GI  -  VVS remains concerned over potential perfusion issues to her BKA  Upper GI bleed  - Patient on chronic Coumadin therapy for severe peripheral vascular disease - EGD on 8/30 - no foci to explain melanotic stools  - c/w PPI  - Hgb stable and appears to be at pt's baseline, repeat CBC in AM Fever  - onset 9/2-9/3, still with Tmax 100.2 F, unclear etiology - FU blood cx's- no respiratory sx's  - associated with sinus tachy  - continue IVF for now and broad spectrum ABX Vancomycin and Zosyn  - will check CXR and UA Ileus 2/2 right colonic constipation  - resolved with multiple stools after 1 bottle mg citrate  - will continue to advance diet as pt able to tolerate  - continue Dulcolax 10 mg PO Q 12  Acute blood loss anemia  - Hb stable after 2 units of PRBC transfusion  HTN / hypotension  - reasonable control over 24 hours  Diabetes  -no change in tx plan today  Mild hypokalemia  - supplemented and now slightly high at 5.2 - repeat BMP shows normal K Acute renal failure - secondary to pre renal etiology - continue IVF and repeat BMP in AM  Code Status: FULL  Family Communication: no family present at time of exam  Disposition Plan: Not ready for discharge   Consultants:  Vasc Surgery  GI - [facility]  Procedures:  EGD - 8/30  R BKA - 8/31  Antibiotics:  Vancomycin and Zosyn 9/3 --> DVT prophylaxis:  lovenox  HPI/Subjective: No events overnight.   Objective: Filed  Vitals:   06/26/13 0611 06/26/13 0816 06/26/13 0929 06/26/13 0953  BP: 120/59  127/90   Pulse: 125  120   Temp: 101.1 F (38.4 C)  100.2 F (37.9 C)   TempSrc:   Oral   Resp: 20 16 20 16   Height:      Weight:      SpO2: 91% 95% 93% 96%    Intake/Output Summary (Last 24 hours) at 06/26/13 1129 Last data filed at 06/26/13 0931  Gross per 24 hour  Intake   1210 ml  Output   2200 ml  Net   -990 ml    Exam:   General: No acute respiratory distress   Lungs: Clear to auscultation bilaterally without  wheezes or crackles   Cardiovascular: Regular rhythm with tachycardic, without murmur gallop or rub normal S1 and S2   Abdomen: Nontender,non distended, positive bowel sounds, no rebound, no ascites, no appreciable mass   Extremities: No significant cyanosis, clubbing, or edema L lower extremity - surgical wounds dressed and dry  Data Reviewed: Basic Metabolic Panel:  Recent Labs Lab 06/23/13 0535 06/24/13 0425 06/24/13 2134 06/25/13 0905 06/26/13 0505  NA 138 137 139 135 138  K 3.3* 4.6 4.8 5.3* 5.2*  CL 100 101 104 101 103  CO2 31 31 26 25 28   GLUCOSE 184* 130* 106* 186* 133*  BUN 6 11 13 13 14   CREATININE 1.12* 1.33* 1.17* 1.09 1.11*  CALCIUM 8.7 8.6 8.3* 8.5 8.5  MG  --   --  2.4  --   --    Liver Function Tests:  Recent Labs Lab 06/21/13 1520 06/22/13 0135  AST 75* 98*  ALT 27 32  ALKPHOS 38* 38*  BILITOT 0.4 0.4  PROT 5.2* 5.3*  ALBUMIN 2.8* 2.7*   CBC:  Recent Labs Lab 06/23/13 0535 06/24/13 0425 06/24/13 2134 06/25/13 0905 06/26/13 0505  WBC 10.5 10.3 11.3* 15.6* 14.1*  NEUTROABS  --   --  7.9*  --   --   HGB 9.0* 8.4* 8.3* 8.7* 8.7*  HCT 28.3* 25.8* 25.9* 26.8* 27.2*  MCV 86.5 86.3 86.9 86.7 86.6  PLT 192 221 237 254 333   CBG:  Recent Labs Lab 06/25/13 0718 06/25/13 1201 06/25/13 1752 06/25/13 2338 06/26/13 0728  GLUCAP 133* 116* 116* 120* 95    Recent Results (from the past 240 hour(s))  MRSA PCR SCREENING     Status: Abnormal   Collection Time    06/21/13 11:55 AM      Result Value Range Status   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 It is not     intended to diagnose MRSA     infection nor to guide or     monitor treatment for     MRSA infections.     RESULT CALLED TO, READ BACK BY AND VERIFIED WITH:     GOLDEN C,RN 1610 06/21/13 SCALES H   CULTURE, BLOOD (ROUTINE X 2)     Status: None   Collection Time    06/24/13 11:00 PM      Result Value Range Status   Specimen Description BLOOD RIGHT HAND   Final   Special Requests BOTTLES DRAWN AEROBIC ONLY  [facility]   Final   Culture  Setup Time     Final   Value: 06/25/2013 05:21     Performed at [facility]   Culture     Final   Value:        BLOOD CULTURE RECEIVED NO GROWTH TO DATE CULTURE WILL BE HELD FOR 5 DAYS BEFORE ISSUING A FINAL NEGATIVE REPORT     Performed at [facility]   Report Status PENDING   Incomplete  CULTURE, BLOOD (ROUTINE X 2)     Status: None   Collection Time    06/24/13 11:10 PM      Result Value Range Status   Specimen Description BLOOD LEFT HAND   Final   Special Requests BOTTLES DRAWN AEROBIC ONLY 5CC   Final   Culture  Setup Time     Final   Value: 06/25/2013 05:21     Performed at [facility]   Culture     Final   Value:        BLOOD CULTURE RECEIVED NO GROWTH TO DATE CULTURE WILL BE HELD FOR 5 DAYS BEFORE ISSUING A FINAL NEGATIVE REPORT     Performed at [facility]   Report Status PENDING   Incomplete    Scheduled Meds: . allopurinol  100 mg Oral Daily  . amitriptyline  50 mg Oral QHS  . atorvastatin  40 mg Oral q1800  . bisacodyl  10 mg Oral Q12H  . docusate sodium  100 mg Oral Daily  . DULoxetine  60 mg Oral Daily  . enoxaparin injection  40 mg Subcutaneous Q24H  . insulin aspart  0-9 Units Subcutaneous TID WC  . LORazepam  1 mg Oral Once  . morphine   Intravenous Q4H  . pantoprazole  40 mg Oral BID  . ZOSYN IV  3.375 g Intravenous Q8H  . pregabalin  300 mg Oral BID  . vancomycin  750 mg Intravenous Q12H  . warfarin  5 mg Oral ONCE-1800   Continuous Infusions: . sodium chloride 125 mL/hr at 06/26/13 0953   Debbora Presto, MD  [facility] Pager [phone]  If 7PM-7AM, please contact night-coverage [email or web address] Password [email or web address] 06/26/2013, 11:29 AM   LOS: 5 days

## 2013-06-26 NOTE — Progress Notes (Signed)
Physical Therapy Treatment Patient Details Name: [name] [name] MRN: [id number] DOB: 1956-05-18 Today's Date: 06/26/2013 Time: 1126-1212 PT Time Calculation (min): 46 min  PT Assessment / Plan / Recommendation  History of Present Illness Patient is a 57 yo female admitted with ischemic Rt leg.  Patient s/p Rt BKA. PMHx DM, PVD, Rt fem-pop   PT Comments   Pt requires incr time for all movements due to pain (despite use of PCA several times during session). She becomes agitated if you attempt to help her move her RLE. Extensive education on reason to incr ROM of Rt knee and pt verbalizes understanding, yet continues to minimally move RLE. Pt demonstrated very good upper body strength and feel she will make good progress as pain control improves.  Follow Up Recommendations  CIR     Does the patient have the potential to tolerate intense rehabilitation     Barriers to Discharge        Equipment Recommendations  Rolling walker with 5" wheels;Wheelchair (measurements PT);Wheelchair cushion (measurements PT)    Recommendations for Other Services Rehab consult  Frequency Min 3X/week   Progress towards PT Goals Progress towards PT goals: Progressing toward goals  Plan Current plan remains appropriate    Precautions / Restrictions Precautions Precautions: Fall Restrictions Weight Bearing Restrictions: No   Pertinent Vitals/Pain 6/10 Rt BKA; used PCA ~3 times during session; patient repositioned for comfort     Mobility  Bed Mobility Bed Mobility: Supine to Sit;Sitting - Scoot to Edge of Bed Supine to Sit: 1: +2 Total assist;HOB elevated Supine to Sit: Patient Percentage: 50% Sitting - Scoot to Edge of Bed: 3: Mod assist Details for Bed Mobility Assistance: Pt initially wanted no assist (due to painful RLE), however unable to come to sitting on her own (even with HOB elevated and rail); cues for sequencing, safe technique.  pt moves slowly.   Transfers Transfers: Sit to Stand;Stand to  Sit;Squat Pivot Transfers Sit to Stand: 4: Min assist;With upper extremity assist;From bed;From chair/3-in-1 Stand to Sit: 4: Min assist;With upper extremity assist;To chair/3-in-1;To bed Squat Pivot Transfers: 1: +2 Total assist Squat Pivot Transfers: Patient Percentage: 60% Details for Transfer Assistance: pt impulsively initiating squat-pivot to [facility] needing to void; stood from [facility] with unilateral UE support on BSC armrest while she performed pericare with RUE with min assist; squat-pivot to her Rt back to bed (again pt impulsive) with min assist; stood from bed with RW with cues for all sequencing and safe use of DMEj Ambulation/Gait Ambulation/Gait Assistance: 1: +2 Total assist Ambulation/Gait: Patient Percentage: 80% Ambulation Distance (Feet): 3 Feet Assistive device: Rolling walker Ambulation/Gait Assistance Details: Better use of bil UEs to offload LLE to allow her to advance LLE/hop.Pt likely could have ambulated greater distance, however she did not want to. Gait Pattern: Step-to pattern;Decreased stride length;Trunk flexed Gait velocity: decreased Stairs: No Wheelchair Mobility Wheelchair Mobility: No    Exercises Amputee Exercises Quad Sets: AROM;Both;5 reps;Supine;Limitations Quad Sets Limitations: pt requires max cues to activate Rt quads  Hip ABduction/ADduction: AROM;Right;10 reps;Supine Hip Flexion/Marching: AROM;Right;5 reps;Supine Knee Flexion: AROM;Right;Seated;5 reps;Supine Knee Extension: AROM;Right;10 reps;Seated;Supine Other Exercises Other Exercises: encouraged full hip and knee extension in standing x 60 seconds   PT Diagnosis:    PT Problem List:   PT Treatment Interventions:     PT Goals (current goals can now be found in the care plan section)    Visit Information  Last PT Received On: 06/26/13 Assistance Needed: +2 PT/OT Co-Evaluation/Treatment: Yes History of Present Illness:  Patient is a 57 yo female admitted with ischemic Rt leg.  Patient s/p  Rt BKA. PMHx DM, PVD, Rt fem-pop    Subjective Data  Subjective: Don't touch it! (re: BKA)   Cognition  Cognition Arousal/Alertness: Awake/alert Behavior During Therapy: Anxious;Impulsive Overall Cognitive Status: Within Functional Limits for tasks assessed    Balance  Balance Balance Assessed: Yes Static Sitting Balance Static Sitting - Balance Support: No upper extremity supported Static Sitting - Level of Assistance: 5: Stand by assistance Static Standing Balance Static Standing - Balance Support: Bilateral upper extremity supported;During functional activity Static Standing - Level of Assistance: 4: Min assist  End of Session PT - End of Session Equipment Utilized During Treatment: Gait belt Activity Tolerance: Patient limited by pain Patient left: with call bell/phone within reach;in chair;with chair alarm set;with nursing/sitter in room Nurse Communication: Mobility status   GP     Mcdaniel Ohms 06/26/2013, 1:56 PM Pager [phone]

## 2013-06-27 LAB — BASIC METABOLIC PANEL
BUN: 14 mg/dL (ref 6–23)
CO2: 26 mEq/L (ref 19–32)
Chloride: 105 mEq/L (ref 96–112)
Creatinine, Ser: 1.12 mg/dL — ABNORMAL HIGH (ref 0.50–1.10)
Potassium: 4.2 mEq/L (ref 3.5–5.1)

## 2013-06-27 LAB — URINE CULTURE
Colony Count: NO GROWTH
Culture: NO GROWTH

## 2013-06-27 LAB — CBC
HCT: 23.6 % — ABNORMAL LOW (ref 36.0–46.0)
Hemoglobin: 7.7 g/dL — ABNORMAL LOW (ref 12.0–15.0)
MCV: 85.2 fL (ref 78.0–100.0)
RBC: 2.77 MIL/uL — ABNORMAL LOW (ref 3.87–5.11)
WBC: 10.9 10*3/uL — ABNORMAL HIGH (ref 4.0–10.5)

## 2013-06-27 LAB — GLUCOSE, CAPILLARY: Glucose-Capillary: 129 mg/dL — ABNORMAL HIGH (ref 70–99)

## 2013-06-27 MED ORDER — WARFARIN SODIUM 7.5 MG PO TABS
7.5000 mg | ORAL_TABLET | Freq: Once | ORAL | Status: AC
  Administered 2013-06-27: 7.5 mg via ORAL
  Filled 2013-06-27: qty 1

## 2013-06-27 MED ORDER — NICOTINE POLACRILEX 2 MG MT GUM
2.0000 mg | CHEWING_GUM | OROMUCOSAL | Status: DC | PRN
  Filled 2013-06-27: qty 1

## 2013-06-27 NOTE — Progress Notes (Signed)
Patient ID: [name] [name], female   DOB: 1956/04/20, 57 y.o.   MRN: [id number]  [facility] PROGRESS NOTE  [name] [name] WUJ:[id number] DOB: 1956/08/31 DOA: 06/21/2013 PCP: No primary provider on file.  Brief narrative: Admit HPI / Brief Narrative:  58 y.o. female with severe peripheral vascular disease status post femoropopliteal bypass last year, with a recent history of acute ischemic leg with thrombectomy just 2 days ago at [facility], discharged on Coumadin and aspirin who presented with worsening pain in the right foot and black stools for 1-2 days. Apparently, since discharge from [facility], patient continued to have pain in the right foot area and as a result she presented to [facility] emergency room where she was found to have cold and pulseless right foot. She was found to have a hemoglobin of 7.6, which was a significant drop from just a several days prior to this admission. [facility] hospitalist was contacted by [facility] ED, both vascular surgery and gastroenterology was also consulted, patient was then transferred to the step down unit for further evaluation and management.   On reviewing the records from recent hospitalization at [facility], and also reviewing the operative note from vascular surgery, it looks like the patient's femoropopliteal graft is occluded, and they have attempted to remove clots 2 days prior to this admission with limited success.   Assessment/Plan:  Ischemic leg - Right /known severe PVD  - known hx of PVD s/p femoropopliteal bypass and recent thrombectomy for the occluded graft on 8/28 at [facility].  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, post  Op day #4 - due to concurrent UGI bleed with acute blood loss anemia, she was not started on IV Heparin after weighing risk vs benefits - she is currently on Coumadin with bridging to Lovenox  but Hg drop noted over 24 hours 8.7 --> 7.7 - will discontinue Lovenox and leave only on Coumadin, monitor H/H closely and monitor for signs of bleeding  Upper GI bleed  - Patient on chronic Coumadin therapy for severe peripheral vascular disease  - EGD on 8/30 - no foci to explain melanotic stools  - c/w PPI  - Hg drop overnight as noted above - d/c Lovenox and continue Coumadin only for now  - repeat CBC in AM Fever  - onset 9/2-9/3, still with Tmax 100.2 F, unclear etiology  - FU blood cx's- no respiratory sx's  - CXR suggestive of developing infiltrate  - continue IVF for now and broad spectrum ABX Vancomycin and Zosyn  Ileus 2/2 right colonic constipation  - resolved with multiple stools after 1 bottle mg citrate  - will continue to advance diet as pt able to tolerate  - continue Dulcolax 10 mg PO Q 12  Acute blood loss anemia  - Hb drop overnight, management as noted above  HTN / hypotension  - reasonable control over 24 hours  Diabetes  -no change in tx plan today  Mild hypokalemia  - k is within normal limits this AM - repeat BMP in AM Acute renal failure  - secondary to pre renal etiology  - continue IVF, Cr trending down   Code Status: FULL  Family Communication: no family present at time of exam  Disposition Plan: Not ready for discharge   Consultants:  Vasc Surgery  [facility] - [facility]  Procedures:   EGD - 8/30   R  BKA - 8/31  CXR 06/26/2013  Early left basilar infiltrate.    Antibiotics:  Vancomycin and Zosyn 9/3 -->  DVT prophylaxis:  On Coumadin  HPI/Subjective: No events overnight.   Objective: Filed Vitals:   06/27/13 0052 06/27/13 0541 06/27/13 0830 06/27/13 0953  BP:  109/50    Pulse:  108    Temp:  99.5 F (37.5 C)    TempSrc:  Oral    Resp: 20 20 16 16   Height:      Weight:      SpO2: 99% 97% 99% 99%    Intake/Output Summary (Last 24 hours) at 06/27/13 1151 Last data filed at 06/27/13 0900  Gross per 24 hour  Intake 954.58 ml  Output       0 ml  Net 954.58 ml    Exam:   General:  Pt is alert, follows commands appropriately, not in acute distress  Cardiovascular: Regular rate and rhythm, S1/S2, no murmurs, no rubs, no gallops  Respiratory: Clear to auscultation bilaterally, no wheezing, diminished breath sounds at bases with mild rhonchi   Abdomen: Soft, non tender, non distended, bowel sounds present, no guarding  Extremities: Right KKA  Neuro: Grossly nonfocal  Data Reviewed: Basic Metabolic Panel:  Recent Labs Lab 06/24/13 0425 06/24/13 2134 06/25/13 0905 06/26/13 0505 06/26/13 1140 06/27/13 0520  NA 137 139 135 138 134* 139  K 4.6 4.8 5.3* 5.2* 4.9 4.2  CL 101 104 101 103 100 105  CO2 31 26 25 28 26 26   GLUCOSE 130* 106* 186* 133* 138* 135*  BUN 11 13 13 14 12 14   CREATININE 1.33* 1.17* 1.09 1.11* 1.17* 1.12*  CALCIUM 8.6 8.3* 8.5 8.5 8.7 8.4  MG  --  2.4  --   --   --   --    Liver Function Tests:  Recent Labs Lab 06/21/13 1520 06/22/13 0135  AST 75* 98*  ALT 27 32  ALKPHOS 38* 38*  BILITOT 0.4 0.4  PROT 5.2* 5.3*  ALBUMIN 2.8* 2.7*   CBC:  Recent Labs Lab 06/24/13 0425 06/24/13 2134 06/25/13 0905 06/26/13 0505 06/27/13 0520  WBC 10.3 11.3* 15.6* 14.1* 10.9*  NEUTROABS  --  7.9*  --   --   --   HGB 8.4* 8.3* 8.7* 8.7* 7.7*  HCT 25.8* 25.9* 26.8* 27.2* 23.6*  MCV 86.3 86.9 86.7 86.6 85.2  PLT 221 237 254 333 379   CBG:  Recent Labs Lab 06/26/13 0728 06/26/13 1152 06/26/13 1605 06/26/13 2121 06/27/13 0719  GLUCAP 95 137* 105* 108* 129*    Recent Results (from the past 240 hour(s))  MRSA PCR SCREENING     Status: Abnormal   Collection Time    06/21/13 11:55 AM      Result Value Range Status   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 It is not     intended to diagnose MRSA      infection nor to guide or     monitor treatment for     MRSA infections.     RESULT CALLED TO, READ BACK BY AND VERIFIED WITH:     GOLDEN C,RN 1610 06/21/13 SCALES H  CULTURE, BLOOD (ROUTINE X 2)  Status: None   Collection Time    06/24/13 11:00 PM      Result Value Range Status   Specimen Description BLOOD RIGHT HAND   Final   Special Requests BOTTLES DRAWN AEROBIC ONLY [facility]   Final   Culture  Setup Time     Final   Value: 06/25/2013 05:21     Performed at [facility]   Culture     Final   Value:        BLOOD CULTURE RECEIVED NO GROWTH TO DATE CULTURE WILL BE HELD FOR 5 DAYS BEFORE ISSUING A FINAL NEGATIVE REPORT     Performed at [facility]   Report Status PENDING   Incomplete  CULTURE, BLOOD (ROUTINE X 2)     Status: None   Collection Time    06/24/13 11:10 PM      Result Value Range Status   Specimen Description BLOOD LEFT HAND   Final   Special Requests BOTTLES DRAWN AEROBIC ONLY [facility]   Final   Culture  Setup Time     Final   Value: 06/25/2013 05:21     Performed at [facility]   Culture     Final   Value:        BLOOD CULTURE RECEIVED NO GROWTH TO DATE CULTURE WILL BE HELD FOR 5 DAYS BEFORE ISSUING A FINAL NEGATIVE REPORT     Performed at [facility]   Report Status PENDING   Incomplete  URINE CULTURE     Status: None   Collection Time    06/25/13  5:30 PM      Result Value Range Status   Specimen Description URINE, CATHETERIZED   Final   Special Requests NONE   Final   Culture  Setup Time     Final   Value: 06/26/2013 02:36     Performed at [facility] Count     Final   Value: NO GROWTH     Performed at [facility]   Culture     Final   Value: NO GROWTH     Performed at [facility]   Report Status 06/27/2013 FINAL   Final     Scheduled Meds: . allopurinol  100 mg Oral Daily  . amitriptyline  50 mg Oral QHS  . atorvastatin  40 mg Oral q1800  . bisacodyl  10 mg Oral Q12H  . docusate  sodium  100 mg Oral Daily  . DULoxetine  60 mg Oral Daily  . enoxaparin injection  40 mg Subcutaneous Q24H  . insulin aspart  0-9 Units Subcutaneous TID WC  . morphine   Intravenous Q4H  . pantoprazole  40 mg Oral BID  . ZOSYN  IV  3.375 g Intravenous Q8H  . pregabalin  300 mg Oral BID  . vancomycin  750 mg Intravenous Q12H  . warfarin  7.5 mg Oral ONCE-1800   Continuous Infusions: . sodium chloride 75 mL/hr at 06/26/13 1544     Debbora Presto, MD  [facility] Pager [phone]  If 7PM-7AM, please contact night-coverage [email or web address] Password [email or web address] 06/27/2013, 11:51 AM   LOS: 6 days

## 2013-06-27 NOTE — Progress Notes (Signed)
ANTICOAGULATION CONSULT NOTE - Follow Up Consult  Pharmacy Consult:  Coumadin Indication:  Severe PVD / ischemia  No Known Allergies  Patient Measurements: Height: 5\' 6"  (167.6 cm) Weight: 163 lb (73.936 kg) IBW/kg (Calculated) : 59.3  Vital Signs: Temp: 99.5 F (37.5 C) (09/05 0541) Temp src: Oral (09/05 0541) BP: 109/50 mmHg (09/05 0541) Pulse Rate: 108 (09/05 0541)  Labs:  Recent Labs  06/25/13 0425 06/25/13 0905 06/26/13 0505 06/26/13 1140 06/27/13 0520  HGB  --  8.7* 8.7*  --  7.7*  HCT  --  26.8* 27.2*  --  23.6*  PLT  --  254 333  --  379  LABPROT 18.6*  --  20.9*  --  20.8*  INR 1.60*  --  1.86*  --  1.85*  CREATININE  --  1.09 1.11* 1.17* 1.12*    Estimated Creatinine Clearance: 56.3 ml/min (by C-G formula based on Cr of 1.12).  Assessment: 32 YOF recently admitted to [facility] where she underwent thrombolysis due to occluded fem-pop bypass (done in 2013).  She was discharged home on ASA and Coumadin on 06/20/13.  Patient presented to [facility] the following day with complaints of worsening pain in the right foot and black stools for 1-2 days.  Currently on Lovenox and Coumadin for severe PVD and ischemia.  INR sub-therapeutic (1.85) but nearing goal range.  There is some question as to whether patient received her Warfarin dose on 9/3 (uncharted), which may account for her level essentially being unchanged.  Goal of Therapy:  INR 2-3 Monitor platelets by anticoagulation protocol: Yes Vanc trough 15-20 mcg/mL   Plan:  - Coumadin 7.5mg  PO today - Continue Lovenox 40mg  SQ Q24H until INR therapeutic - Daily PT / INR - Vanc 750mg  IV Q12H - Zosyn 3.375gm IV Q8H, 4 hr infusion - Monitor renal fxn, clinical course, vanc trough as indicated - F/U remove KCL from IVF    Thuy D. Laney Potash, PharmD, BCPS Pager:  [phone] 06/27/2013, 8:47 AM

## 2013-06-27 NOTE — Progress Notes (Signed)
I will follow up with pt's progress on Monday. [phone]

## 2013-06-27 NOTE — Progress Notes (Signed)
[facility] Progress Note  Subjective: Patient states he is still having dark stools and has continued to his hemoglobin since his surgery. He dear hemoglobin of 7.7 today and is requiring transfusion.  Objective: Vital signs in last 24 hours: Temp:  [98.6 F (37 C)-101.3 F (38.5 C)] 99.5 F (37.5 C) (09/05 0541) Pulse Rate:  [97-124] 108 (09/05 0541) Resp:  [16-24] 20 (09/05 1236) BP: (109-124)/(50-72) 109/50 mmHg (09/05 0541) SpO2:  [92 %-100 %] 96 % (09/05 1236) FiO2 (%):  [40 %] 40 % (09/05 0052) Weight change:    PE: Abdomen soft  Lab Results: Results for orders placed during the hospital encounter of 06/21/13 (from the past 24 hour(s))  PROCALCITONIN     Status: None   Collection Time    06/26/13  3:30 PM      Result Value Range   Procalcitonin 0.83    GLUCOSE, CAPILLARY     Status: Abnormal   Collection Time    06/26/13  4:05 PM      Result Value Range   Glucose-Capillary 105 (*) 70 - 99 mg/dL  URINALYSIS, ROUTINE W REFLEX MICROSCOPIC     Status: Abnormal   Collection Time    06/26/13  6:20 PM      Result Value Range   Color, Urine YELLOW  YELLOW   APPearance CLEAR  CLEAR   Specific Gravity, Urine 1.011  1.005 - 1.030   pH 8.0  5.0 - 8.0   Glucose, UA NEGATIVE  NEGATIVE mg/dL   Hgb urine dipstick MODERATE (*) NEGATIVE   Bilirubin Urine NEGATIVE  NEGATIVE   Ketones, ur NEGATIVE  NEGATIVE mg/dL   Protein, ur NEGATIVE  NEGATIVE mg/dL   Urobilinogen, UA 0.2  0.0 - 1.0 mg/dL   Nitrite NEGATIVE  NEGATIVE   Leukocytes, UA SMALL (*) NEGATIVE  URINE MICROSCOPIC-ADD ON     Status: Abnormal   Collection Time    06/26/13  6:20 PM      Result Value Range   Squamous Epithelial / LPF MANY (*) RARE   WBC, UA 0-2  <3 WBC/hpf   Urine-Other FEW YEAST    GLUCOSE, CAPILLARY     Status: Abnormal   Collection Time    06/26/13  9:21 PM      Result Value Range   Glucose-Capillary 108 (*) 70 - 99 mg/dL  PROTIME-INR     Status: Abnormal   Collection Time   06/27/13  5:20 AM      Result Value Range   Prothrombin Time 20.8 (*) 11.6 - 15.2 seconds   INR 1.85 (*) 0.00 - 1.49  CBC     Status: Abnormal   Collection Time    06/27/13  5:20 AM      Result Value Range   WBC 10.9 (*) 4.0 - 10.5 K/uL   RBC 2.77 (*) 3.87 - 5.11 MIL/uL   Hemoglobin 7.7 (*) 12.0 - 15.0 g/dL   HCT 14.7 (*) 82.9 - 56.2 %   MCV 85.2  78.0 - 100.0 fL   MCH 27.8  26.0 - 34.0 pg   MCHC 32.6  30.0 - 36.0 g/dL   RDW 13.0 (*) 86.5 - 78.4 %   Platelets 379  150 - 400 K/uL  BASIC METABOLIC PANEL     Status: Abnormal   Collection Time    06/27/13  5:20 AM      Result Value Range   Sodium 139  135 - 145 mEq/L   Potassium 4.2  3.5 -  5.1 mEq/L   Chloride 105  96 - 112 mEq/L   CO2 26  19 - 32 mEq/L   Glucose, Bld 135 (*) 70 - 99 mg/dL   BUN 14  6 - 23 mg/dL   Creatinine, Ser 1.61 (*) 0.50 - 1.10 mg/dL   Calcium 8.4  8.4 - 09.6 mg/dL   GFR calc non Af Amer 53 (*) >90 mL/min   GFR calc Af Amer 62 (*) >90 mL/min  GLUCOSE, CAPILLARY     Status: Abnormal   Collection Time    06/27/13  7:19 AM      Result Value Range   Glucose-Capillary 129 (*) 70 - 99 mg/dL  GLUCOSE, CAPILLARY     Status: None   Collection Time    06/27/13 12:00 PM      Result Value Range   Glucose-Capillary 88  70 - 99 mg/dL    Studies/Results: Dg Chest Port 1 View  06/26/2013   *RADIOLOGY REPORT*  Clinical Data: Fever  PORTABLE CHEST - 1 VIEW  Comparison: None  Findings: The heart and pulmonary vascularity are within normal limits.  The lungs are well-aerated bilaterally. Increased density is noted in the left lung base consistent with early infiltrate. No other focal abnormality is noted.  IMPRESSION: Early left basilar infiltrate.   Original Report Authenticated By: Alcide Clever, M.D.      Assessment: Some degree of GI bleeding with preoperative EGD unrevealing. The patient states he has had a colonoscopy through the [place] system and [place] within  the last 2 or 3 years although the results are not  available  Plan: Will check stool Hemoccults which surprisingly have not been done this admission and I can tell. If positive and/or his hemoglobin is stabilized, we'll probably need colonoscopy before discharge.    Nolita Kutter C 06/27/2013, 2:01 PM

## 2013-06-28 LAB — GLUCOSE, CAPILLARY
Glucose-Capillary: 110 mg/dL — ABNORMAL HIGH (ref 70–99)
Glucose-Capillary: 81 mg/dL (ref 70–99)
Glucose-Capillary: 109 mg/dL — ABNORMAL HIGH (ref 70–99)

## 2013-06-28 LAB — CBC
HCT: 21.2 % — ABNORMAL LOW (ref 36.0–46.0)
MCH: 28.2 pg (ref 26.0–34.0)
MCHC: 33 g/dL (ref 30.0–36.0)
RDW: 17 % — ABNORMAL HIGH (ref 11.5–15.5)

## 2013-06-28 LAB — BASIC METABOLIC PANEL
BUN: 12 mg/dL (ref 6–23)
Calcium: 8.5 mg/dL (ref 8.4–10.5)
GFR calc Af Amer: 62 mL/min — ABNORMAL LOW (ref >90)
GFR calc non Af Amer: 54 mL/min — ABNORMAL LOW (ref >90)
Glucose, Bld: 110 mg/dL — ABNORMAL HIGH (ref 70–99)
Potassium: 4 mEq/L (ref 3.5–5.1)
Sodium: 138 mEq/L (ref 135–145)

## 2013-06-28 LAB — PROCALCITONIN: Procalcitonin: 0.46 ng/mL

## 2013-06-28 LAB — PROTIME-INR
INR: 2.14 — ABNORMAL HIGH (ref 0.00–1.49)
Prothrombin Time: 23.2 seconds — ABNORMAL HIGH (ref 11.6–15.2)

## 2013-06-28 MED ORDER — OXYCODONE-ACETAMINOPHEN 5-325 MG PO TABS
1.0000 | ORAL_TABLET | ORAL | Status: DC | PRN
  Administered 2013-06-28 – 2013-07-01 (×11): 2 via ORAL
  Filled 2013-06-28 (×11): qty 2

## 2013-06-28 NOTE — Progress Notes (Addendum)
Patient ID: [name] [name], female   DOB: Jan 26, 1956, 57 y.o.   MRN: [id number]  [facility] PROGRESS NOTE  [name] [name] WUJ:[id number] DOB: [birth year] DOA: 06/21/2013 PCP: No primary provider on file.  Brief narrative:  57 y.o. female with severe peripheral vascular disease status post femoropopliteal bypass last year, with a recent history of acute ischemic leg with thrombectomy just 2 days ago at [facility], discharged on Coumadin and aspirin who presented with worsening pain in the right foot and black stools for 1-2 days. Apparently, since discharge from [facility], patient continued to have pain in the right foot area and as a result she presented to [facility] emergency room where she was found to have cold and pulseless right foot. She was found to have a hemoglobin of 7.6, which was a significant drop from just a several days prior to this admission. [facility] hospitalist was contacted by [facility] ED, both vascular surgery and gastroenterology was also consulted, patient was then transferred to the step down unit for further evaluation and management.   On reviewing the records from recent hospitalization at [facility], and also reviewing the operative note from vascular surgery, it looks like the patient's femoropopliteal graft is occluded, and they have attempted to remove clots 2 days prior to this admission with limited success.   Assessment/Plan:  Ischemic leg - Right /known severe PVD  - known hx of PVD s/p femoropopliteal bypass and recent thrombectomy for the occluded graft on 8/28 at [facility].  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, post Op day #4  - due to concurrent UGI bleed with acute blood loss anemia, she was not started on IV Heparin after weighing risk vs benefits  - she is currently on Coumadin with bridging to Lovenox but Hg drop noted over 24 hours  8.7 --> 7.7 --> 7.0 - will discontinue Lovenox and Coumadin, transfuse 2 units of PRBC Upper GI bleed  - Patient on chronic Coumadin therapy for severe peripheral vascular disease  - EGD on 8/30 - no foci to explain melanotic stools  - c/w PPI  - Hg drop overnight as noted above  - d/c Lovenox and Coumadin - transfuse 2 units of PRBC today   Fever  - onset 9/2-9/3, still with Tmax 100.2 F, unclear etiology  - FU blood cx's- no respiratory sx's  - CXR 9/4 suggestive of developing infiltrate  - continue IVF for now and broad spectrum ABX Vancomycin and Zosyn  Ileus 2/2 right colonic constipation  - resolved with multiple stools after 1 bottle mg citrate  - will continue to advance diet as pt able to tolerate  - continue Dulcolax 10 mg PO Q 12  Acute blood loss anemia  - Hb drop overnight, management as noted above  HTN / hypotension  - reasonable control over 24 hours  Diabetes  -no change in tx plan today  Mild hypokalemia  - k is within normal limits this AM  - repeat BMP in AM  Acute renal failure  - secondary to pre renal etiology  - creatinine is stable ~ 1.1  Code Status: FULL  Family Communication: no family present at time of exam  Disposition Plan: Not ready for discharge   Consultants:  Vasc Surgery  GI - [facility]  Procedures:  EGD - 8/30  R BKA - 8/31  CXR 06/26/2013 Early left basilar infiltrate.  Antibiotics:  Vancomycin and Zosyn 9/3 -->  DVT prophylaxis:  SCD on left lower extremity    HPI/Subjective: No events overnight.   Objective: Filed Vitals:   06/28/13 0045 06/28/13 0105 06/28/13 0411 06/28/13 0624  BP:  131/72  109/74  Pulse:  114  88  Temp:  98.6 F (37 C)  98.8 F (37.1 C)  TempSrc:  Oral  Oral  Resp: 15 21 13 18   Height:      Weight:    77.8 kg (171 lb 8.3 oz)  SpO2: 97% 96% 97% 97%    Intake/Output Summary (Last 24 hours) at 06/28/13 0724 Last data filed at 06/28/13 0107  Gross per 24 hour  Intake    720 ml  Output    500 ml   Net    220 ml    Exam:   General:  Pt is alert, follows commands appropriately, not in acute distress  Cardiovascular: Regular rate and rhythm, S1/S2, no murmurs, no rubs, no gallops  Respiratory: Clear to auscultation bilaterally, no wheezing, no crackles, no rhonchi  Abdomen: Soft, non tender, non distended, bowel sounds present, no guarding  Extremities: Right BKA  Neuro: Grossly nonfocal  Data Reviewed: Basic Metabolic Panel:  Recent Labs Lab 06/24/13 0425 06/24/13 2134 06/25/13 0905 06/26/13 0505 06/26/13 1140 06/27/13 0520 06/28/13 0605  NA 137 139 135 138 134* 139 138  K 4.6 4.8 5.3* 5.2* 4.9 4.2 4.0  CL 101 104 101 103 100 105 104  CO2 31 26 25 28 26 26 26   GLUCOSE 130* 106* 186* 133* 138* 135* 110*  BUN 11 13 13 14 12 14 12   CREATININE 1.33* 1.17* 1.09 1.11* 1.17* 1.12* 1.11*  CALCIUM 8.6 8.3* 8.5 8.5 8.7 8.4 8.5  MG  --  2.4  --   --   --   --   --    Liver Function Tests:  Recent Labs Lab 06/21/13 1520 06/22/13 0135  AST 75* 98*  ALT 27 32  ALKPHOS 38* 38*  BILITOT 0.4 0.4  PROT 5.2* 5.3*  ALBUMIN 2.8* 2.7*   CBC:  Recent Labs Lab 06/24/13 0425 06/24/13 2134 06/25/13 0905 06/26/13 0505 06/27/13 0520 06/28/13 0605  WBC 10.3 11.3* 15.6* 14.1* 10.9* 9.0  NEUTROABS  --  7.9*  --   --   --   --   HGB 8.4* 8.3* 8.7* 8.7* 7.7* 7.0*  HCT 25.8* 25.9* 26.8* 27.2* 23.6* 21.2*  MCV 86.3 86.9 86.7 86.6 85.2 85.5  PLT 221 237 254 333 379 414*   CBG:  Recent Labs Lab 06/26/13 2121 06/27/13 0719 06/27/13 1200 06/27/13 1726 06/27/13 2214  GLUCAP 108* 129* 88 118* 115*    Recent Results (from the past 240 hour(s))  MRSA PCR SCREENING     Status: Abnormal   Collection Time    06/21/13 11:55 AM      Result Value Range Status   MRSA by PCR POSITIVE (*) NEGATIVE Final   Comment:            The GeneXpert MRSA Assay (FDA     approved for NASAL specimens     only), is one component of a     comprehensive MRSA colonization      surveillance program. It is not     intended to diagnose MRSA     infection nor to guide or     monitor treatment for     MRSA infections.     RESULT CALLED TO, READ BACK  BY AND VERIFIED WITH:     GOLDEN C,RN 1610 06/21/13 SCALES H  CULTURE, BLOOD (ROUTINE X 2)     Status: None   Collection Time    06/24/13 11:00 PM      Result Value Range Status   Specimen Description BLOOD RIGHT HAND   Final   Special Requests BOTTLES DRAWN AEROBIC ONLY [facility]   Final   Culture  Setup Time     Final   Value: 06/25/2013 05:21     Performed at [facility]   Culture     Final   Value:        BLOOD CULTURE RECEIVED NO GROWTH TO DATE CULTURE WILL BE HELD FOR 5 DAYS BEFORE ISSUING A FINAL NEGATIVE REPORT     Performed at [facility]   Report Status PENDING   Incomplete  CULTURE, BLOOD (ROUTINE X 2)     Status: None   Collection Time    06/24/13 11:10 PM      Result Value Range Status   Specimen Description BLOOD LEFT HAND   Final   Special Requests BOTTLES DRAWN AEROBIC ONLY [facility]   Final   Culture  Setup Time     Final   Value: 06/25/2013 05:21     Performed at [facility]   Culture     Final   Value:        BLOOD CULTURE RECEIVED NO GROWTH TO DATE CULTURE WILL BE HELD FOR 5 DAYS BEFORE ISSUING A FINAL NEGATIVE REPORT     Performed at [facility]   Report Status PENDING   Incomplete  URINE CULTURE     Status: None   Collection Time    06/25/13  5:30 PM      Result Value Range Status   Specimen Description URINE, CATHETERIZED   Final   Special Requests NONE   Final   Culture  Setup Time     Final   Value: 06/26/2013 02:36     Performed at [facility] Count     Final   Value: NO GROWTH     Performed at [facility]   Culture     Final   Value: NO GROWTH     Performed at [facility]   Report Status 06/27/2013 FINAL   Final     Scheduled Meds: . allopurinol  100 mg Oral Daily  . amitriptyline  50 mg Oral QHS  .  atorvastatin  40 mg Oral q1800  . bisacodyl  10 mg Oral Q12H  . docusate sodium  100 mg Oral Daily  . DULoxetine  60 mg Oral Daily  . insulin aspart  0-9 Units Subcutaneous TID WC  . morphine   Intravenous Q4H  . pantoprazole  40 mg Oral BID  . piperacillin-tazobactam (ZOSYN)  IV  3.375 g Intravenous Q8H  . pregabalin  300 mg Oral BID  . sodium chloride  3 mL Intravenous Q12H  . vancomycin  750 mg Intravenous Q12H  . Warfarin - Pharmacist Dosing Inpatient   Does not apply q1800   Continuous Infusions: . sodium chloride 50 mL/hr at 06/27/13 1408     [name] Presto, MD  [facility] Pager [phone]  If 7PM-7AM, please contact night-coverage [email or web address] Password [email or web address] 06/28/2013, 7:24 AM   LOS: 7 days

## 2013-06-28 NOTE — Progress Notes (Signed)
Awaiting hemoccults.  Hgb 7.7--> 7.0 INR 2.1. [facility] will revisit Monday re: whether colonoscopy is needed.  Would need INR </= 1.8 prior to any consideration of colonoscopy.

## 2013-06-28 NOTE — Progress Notes (Signed)
Pt to receive blood. Temp 100.6. Dr Lenise Arena made aware. 2 Tylenol given prior to infusion

## 2013-06-28 NOTE — Progress Notes (Signed)
ANTICOAGULATION / ANTIBIOTIC CONSULT NOTE - Follow Up Consult  Pharmacy Consult:  Coumadin +  Vancomycin / Zosyn Indication:  Severe PVD / ischemia + empiric for fever post surgery  No Known Allergies  Patient Measurements: Height: 5\' 6"  (167.6 cm) Weight: 171 lb 8.3 oz (77.8 kg) IBW/kg (Calculated) : 59.3  Vital Signs: Temp: 98.8 F (37.1 C) (09/06 0624) Temp src: Oral (09/06 0624) BP: 109/74 mmHg (09/06 0624) Pulse Rate: 88 (09/06 0624)  Labs:  Recent Labs  06/26/13 0505 06/26/13 1140 06/27/13 0520 06/28/13 0605  HGB 8.7*  --  7.7* 7.0*  HCT 27.2*  --  23.6* 21.2*  PLT 333  --  379 414*  LABPROT 20.9*  --  20.8* 23.2*  INR 1.86*  --  1.85* 2.14*  CREATININE 1.11* 1.17* 1.12* 1.11*    Estimated Creatinine Clearance: 58.2 ml/min (by C-G formula based on Cr of 1.11).     Assessment: 71 YOF recently admitted to [facility] where she underwent thrombolysis due to occluded fem-pop bypass (done in 2013).  She was discharged home on ASA and Coumadin on 06/20/13.  Patient presented to [facility] the following day with complaints of worsening pain in the right foot and black stools for 1-2 days.  Patient was continued on Coumadin for severe PVD and ischemia, and INR therapeutic today.  Noted patient reported dark stools and MD checking hemoccult with possibility of colonoscopy next week.  Patient is also on vancomycin and Zosyn, initially started for fever in setting of recent surgery.  Now afebrile.  CXR on 06/26/13 showed possible early PNA.  Her renal function remains stable.  9/2 blood cx - NGTD 9/3 urine cx - negative 8/30 MRSA PCR - positive (s/p CHG/mupirocin)   Goal of Therapy:  INR 2-3, INR < 1.8 if to proceed with colonoscopy Monitor platelets by anticoagulation protocol: Yes Vanc trough 15-20 mcg/mL    Plan:  - D/C Coumadin consult until further notice per discussion with MD - Daily PT / INR - Vanc 750mg  IV Q12H - Zosyn 3.375gm IV Q8H, 4 hr infusion - Monitor renal  fxn, clinical course, vanc trough tomorrow 0530     Lyzbeth Genrich D. Laney Potash, PharmD, BCPS Pager:  [phone] 06/28/2013, 10:19 AM

## 2013-06-29 LAB — CBC
HCT: 33.1 % — ABNORMAL LOW (ref 36.0–46.0)
Hemoglobin: 11.3 g/dL — ABNORMAL LOW (ref 12.0–15.0)
MCV: 84.7 fL (ref 78.0–100.0)
RBC: 3.91 MIL/uL (ref 3.87–5.11)
WBC: 11.1 10*3/uL — ABNORMAL HIGH (ref 4.0–10.5)

## 2013-06-29 LAB — GLUCOSE, CAPILLARY
Glucose-Capillary: 77 mg/dL (ref 70–99)
Glucose-Capillary: 120 mg/dL — ABNORMAL HIGH (ref 70–99)

## 2013-06-29 LAB — BASIC METABOLIC PANEL
BUN: 12 mg/dL (ref 6–23)
CO2: 26 mEq/L (ref 19–32)
Chloride: 103 mEq/L (ref 96–112)
Creatinine, Ser: 1.06 mg/dL (ref 0.50–1.10)
Glucose, Bld: 106 mg/dL — ABNORMAL HIGH (ref 70–99)

## 2013-06-29 LAB — TYPE AND SCREEN
ABO/RH(D): B POS
Antibody Screen: NEGATIVE
Unit division: 0

## 2013-06-29 MED ORDER — VANCOMYCIN HCL IN DEXTROSE 1-5 GM/200ML-% IV SOLN
1000.0000 mg | Freq: Two times a day (BID) | INTRAVENOUS | Status: DC
  Administered 2013-06-29 – 2013-06-30 (×2): 1000 mg via INTRAVENOUS
  Filled 2013-06-29 (×4): qty 200

## 2013-06-29 NOTE — Progress Notes (Signed)
Patient ID: [name] [name], female   DOB: [birth year], 57 y.o.   MRN: [id number] [facility] PROGRESS NOTE  [name] [name] WUJ:[id number] DOB: 02-Nov-1955 DOA: 06/21/2013 PCP: No primary provider on file. Brief narrative:  57 y.o. female with severe peripheral vascular disease status post femoropopliteal bypass last year, with a recent history of acute ischemic leg with thrombectomy just 2 days ago at [facility], discharged on Coumadin and aspirin who presented with worsening pain in the right foot and black stools for 1-2 days. Apparently, since discharge from [facility], patient continued to have pain in the right foot area and as a result she presented to [facility] emergency room where she was found to have cold and pulseless right foot. She was found to have a hemoglobin of 7.6, which was a significant drop from just a several days prior to this admission. [facility] hospitalist was contacted by [facility] ED, both vascular surgery and gastroenterology was also consulted, patient was then transferred to the step down unit for further evaluation and management.   On reviewing the records from recent hospitalization at [facility], and also reviewing the operative note from vascular surgery, it looks like the patient's femoropopliteal graft is occluded, and they have attempted to remove clots 2 days prior to this admission with limited success.   Assessment/Plan:  Ischemic leg - Right /known severe PVD  - known hx of PVD s/p femoropopliteal bypass and recent thrombectomy for the occluded graft on 8/28 at [facility].  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, post Op day #5 - due to concurrent UGI bleed with acute blood loss anemia, she was not started on IV Heparin after weighing risk vs benefits  Upper GI bleed  - Patient on chronic Coumadin therapy for severe peripheral vascular  disease  - EGD on 8/30 - no foci to explain melanotic stools  - c/w PPI  - continue to hold Lovenox and Coumadin  - will follow up on GI recommendations in terms of endoscopy need Fever  - onset 9/2-9/3, afebrile over 48 hours  - blood cx's negative to date - CXR 9/4 suggestive of developing infiltrate  - continue broad spectrum ABX Vancomycin and Zosyn and plan on transitioning to oral ABX in AM Ileus 2/2 right colonic constipation  - resolved with multiple stools after 1 bottle mg citrate  - will continue to advance diet as pt able to tolerate  - continue Dulcolax 10 mg PO Q 12  Acute blood loss anemia  - status post 2 U PRBC transfusion 06/28/2013 - CBC in AM - continue to hold Coumadin today and if Hg stable over next 24 hours will see with GI if agrees to continue Coumadin  HTN / hypotension  - slightly elevated this AM, monitor closely and add Hydralazine PRN only at this time  Diabetes  -no change in tx plan today  Mild hypokalemia  - k is within normal limits this AM  - repeat BMP in AM  Acute renal failure  - secondary to pre renal etiology  - creatinine is stable ~ 1.1   Code Status: FULL  Family Communication: no family present at time of exam  Disposition Plan: Not ready for discharge   Consultants:  Vasc Surgery  GI - [facility]  Procedures:  EGD - 8/30  R BKA - 8/31  CXR 06/26/2013 Early left basilar infiltrate.  Antibiotics:  Vancomycin and Zosyn 9/3 -->  DVT prophylaxis:  SCD on left lower extremity    HPI/Subjective: No events overnight.   Objective: Filed Vitals:   06/28/13 1952 06/28/13 2100 06/28/13 2149 06/29/13 0550  BP: 123/67 156/83 137/81 175/89  Pulse: 98 107 94 98  Temp: 99.1 F (37.3 C) 98.6 F (37 C) 100 F (37.8 C) 98.8 F (37.1 C)  TempSrc:  Oral Oral Tympanic  Resp:   18 20  Height:      Weight:    78.5 kg (173 lb 1 oz)  SpO2:  98% 94% 98%    Intake/Output Summary (Last 24 hours) at 06/29/13 0704 Last data filed at 06/29/13  0551  Gross per 24 hour  Intake   1390 ml  Output   3600 ml  Net  -2210 ml    Exam:   General:  Pt is alert, follows commands appropriately, not in acute distress  Cardiovascular: Regular rate and rhythm, S1/S2, no murmurs, no rubs, no gallops  Respiratory: Clear to auscultation bilaterally, no wheezing, no crackles, no rhonchi  Abdomen: Soft, non tender, non distended, bowel sounds present, no guarding  Extremities: Right BKA, no edema, pulses DP and PT palpable bilaterally  Neuro: Grossly nonfocal  Data Reviewed: Basic Metabolic Panel:  Recent Labs Lab 06/24/13 0425 06/24/13 2134 06/25/13 0905 06/26/13 0505 06/26/13 1140 06/27/13 0520 06/28/13 0605  NA 137 139 135 138 134* 139 138  K 4.6 4.8 5.3* 5.2* 4.9 4.2 4.0  CL 101 104 101 103 100 105 104  CO2 31 26 25 28 26 26 26   GLUCOSE 130* 106* 186* 133* 138* 135* 110*  BUN 11 13 13 14 12 14 12   CREATININE 1.33* 1.17* 1.09 1.11* 1.17* 1.12* 1.11*  CALCIUM 8.6 8.3* 8.5 8.5 8.7 8.4 8.5  MG  --  2.4  --   --   --   --   --    CBC:  Recent Labs Lab 06/24/13 0425 06/24/13 2134 06/25/13 0905 06/26/13 0505 06/27/13 0520 06/28/13 0605 06/29/13 0530  WBC 10.3 11.3* 15.6* 14.1* 10.9* 9.0 11.1*  NEUTROABS  --  7.9*  --   --   --   --   --   HGB 8.4* 8.3* 8.7* 8.7* 7.7* 7.0* 11.3*  HCT 25.8* 25.9* 26.8* 27.2* 23.6* 21.2* 33.1*  MCV 86.3 86.9 86.7 86.6 85.2 85.5 84.7  PLT 221 237 254 333 379 414* 495*   CBG:  Recent Labs Lab 06/27/13 2214 06/28/13 0757 06/28/13 1132 06/28/13 1754 06/28/13 2118  GLUCAP 115* 110* 81 79 109*    Recent Results (from the past 240 hour(s))  MRSA PCR SCREENING     Status: Abnormal   Collection Time    06/21/13 11:55 AM      Result Value Range Status   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 It is  not     intended to diagnose MRSA     infection nor to guide or     monitor treatment for     MRSA infections.     RESULT CALLED TO, READ BACK BY AND VERIFIED WITH:     GOLDEN C,RN 1610 06/21/13 SCALES H  CULTURE, BLOOD (ROUTINE X  2)     Status: None   Collection Time    06/24/13 11:00 PM      Result Value Range Status   Specimen Description BLOOD RIGHT HAND   Final   Special Requests BOTTLES DRAWN AEROBIC ONLY [facility]   Final   Culture  Setup Time     Final   Value: 06/25/2013 05:21     Performed at [facility]   Culture     Final   Value:        BLOOD CULTURE RECEIVED NO GROWTH TO DATE CULTURE WILL BE HELD FOR 5 DAYS BEFORE ISSUING A FINAL NEGATIVE REPORT     Performed at [facility]   Report Status PENDING   Incomplete  CULTURE, BLOOD (ROUTINE X 2)     Status: None   Collection Time    06/24/13 11:10 PM      Result Value Range Status   Specimen Description BLOOD LEFT HAND   Final   Special Requests BOTTLES DRAWN AEROBIC ONLY [facility]   Final   Culture  Setup Time     Final   Value: 06/25/2013 05:21     Performed at [facility]   Culture     Final   Value:        BLOOD CULTURE RECEIVED NO GROWTH TO DATE CULTURE WILL BE HELD FOR 5 DAYS BEFORE ISSUING A FINAL NEGATIVE REPORT     Performed at [facility]   Report Status PENDING   Incomplete  URINE CULTURE     Status: None   Collection Time    06/25/13  5:30 PM      Result Value Range Status   Specimen Description URINE, CATHETERIZED   Final   Special Requests NONE   Final   Culture  Setup Time     Final   Value: 06/26/2013 02:36     Performed at [facility] Count     Final   Value: NO GROWTH     Performed at [facility]   Culture     Final   Value: NO GROWTH     Performed at [facility]   Report Status 06/27/2013 FINAL   Final     Scheduled Meds: . allopurinol  100 mg Oral Daily  . amitriptyline  50 mg Oral QHS  . atorvastatin  40 mg Oral q1800  .  bisacodyl  10 mg Oral Q12H  . docusate sodium  100 mg Oral Daily  . DULoxetine  60 mg Oral Daily  . insulin aspart  0-9 Units Subcutaneous TID WC  . pantoprazole  40 mg Oral BID  . piperacillin-tazobactam (ZOSYN)  IV  3.375 g Intravenous Q8H  . pregabalin  300 mg Oral BID  . sodium chloride  3 mL Intravenous Q12H  . vancomycin  750 mg Intravenous Q12H   Continuous Infusions: . sodium chloride 50 mL/hr at 06/27/13 1408   Debbora Presto, MD  [facility] Pager [phone]  If 7PM-7AM, please contact night-coverage [email or web address] Password [email or web address] 06/29/2013, 7:04 AM   LOS: 8 days

## 2013-06-29 NOTE — Progress Notes (Addendum)
ANTIBIOTIC CONSULT NOTE:  FOLLOW-UP  Pharmacy Consult:  Vancomycin / Zosyn Indication:  Empiric for fever post surgery + possible early PNA  No Known Allergies  Patient Measurements: Height: 5\' 6"  (167.6 cm) Weight: 173 lb 1 oz (78.5 kg) IBW/kg (Calculated) : 59.3  Vital Signs: Temp: 98.8 F (37.1 C) (09/07 0550) Temp src: Tympanic (09/07 0550) BP: 175/89 mmHg (09/07 0550) Pulse Rate: 98 (09/07 0550) Intake/Output from previous day: 09/06 0701 - 09/07 0700 In: 1390 [P.O.:240; I.V.:300; Blood:650; IV Piggyback:200] Out: 3600 [Urine:3600]  Labs:  Recent Labs  06/27/13 0520 06/28/13 0605 06/29/13 0530  WBC 10.9* 9.0 11.1*  HGB 7.7* 7.0* 11.3*  PLT 379 414* 495*  CREATININE 1.12* 1.11* 1.06   Estimated Creatinine Clearance: 61.2 ml/min (by C-G formula based on Cr of 1.06).  Recent Labs  06/29/13 0530  VANCOTROUGH 12.5     Microbiology: Recent Results (from the past 720 hour(s))  MRSA PCR SCREENING     Status: Abnormal   Collection Time    06/21/13 11:55 AM      Result Value Range Status   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 It is not     intended to diagnose MRSA     infection nor to guide or     monitor treatment for     MRSA infections.     RESULT CALLED TO, READ BACK BY AND VERIFIED WITH:     GOLDEN C,RN 1610 06/21/13 SCALES H  CULTURE, BLOOD (ROUTINE X 2)     Status: None   Collection Time    06/24/13 11:00 PM      Result Value Range Status   Specimen Description BLOOD RIGHT HAND   Final   Special Requests BOTTLES DRAWN AEROBIC ONLY [facility]   Final   Culture  Setup Time     Final   Value: 06/25/2013 05:21     Performed at [facility]   Culture     Final   Value:        BLOOD CULTURE RECEIVED NO GROWTH TO DATE CULTURE WILL BE HELD FOR 5 DAYS BEFORE ISSUING A  FINAL NEGATIVE REPORT     Performed at [facility]   Report Status PENDING   Incomplete  CULTURE, BLOOD (ROUTINE X 2)     Status: None   Collection Time    06/24/13 11:10 PM      Result Value Range Status   Specimen Description BLOOD LEFT HAND   Final   Special Requests BOTTLES DRAWN AEROBIC ONLY 5[facility]   Final   Culture  Setup Time     Final   Value: 06/25/2013 05:21     Performed at [facility]   Culture     Final   Value:        BLOOD CULTURE RECEIVED NO GROWTH TO DATE CULTURE WILL BE HELD FOR 5 DAYS BEFORE ISSUING A FINAL NEGATIVE REPORT     Performed at [facility]   Report Status PENDING   Incomplete  URINE CULTURE     Status: None   Collection Time    06/25/13  5:30 PM      Result Value Range Status  Specimen Description URINE, CATHETERIZED   Final   Special Requests NONE   Final   Culture  Setup Time     Final   Value: 06/26/2013 02:36     Performed at [facility]   Colony Count     Final   Value: NO GROWTH     Performed at [facility]   Culture     Final   Value: NO GROWTH     Performed at [facility]   Report Status 06/27/2013 FINAL   Final       Assessment: 57 YOF on vancomycin and Zosyn as empiric therapy for fever post surgery and possible early PNA on CXR.  Fever thought to have resolved, but then returned yesterday.  Her WBC is trending back up.  Patient's renal function is relatively stable and her vancomycin trough is sub-therapeutic.  Vanc 9/3 >> Zosyn 9/3 >>  9/7 VT = 12.5 mcg/mL (750mg  q12, SCr 1.06)  9/2 blood cx - NGTD 9/3 urine cx - negative 8/30 MRSA PCR - positive (s/p CHG/mupirocin)   Goal of Therapy:  Vancomycin trough level 15-20 mcg/ml   Plan:  - Increase vanc to 1gm IV Q12H, start this afternoon - Continue Zosyn 3.375gm IV Q8H, 4 hr infusion - Monitor renal fxn, clinical course, PRN vanc trough - F/U resume Coumadin when appropriate    Joline Encalada D. Laney Potash, PharmD, BCPS Pager:  [phone] 06/29/2013, 7:55 AM

## 2013-06-30 LAB — OCCULT BLOOD X 1 CARD TO LAB, STOOL: Fecal Occult Bld: NEGATIVE

## 2013-06-30 LAB — PROTIME-INR
INR: 1.89 — ABNORMAL HIGH (ref 0.00–1.49)
Prothrombin Time: 21.1 seconds — ABNORMAL HIGH (ref 11.6–15.2)

## 2013-06-30 LAB — GLUCOSE, CAPILLARY
Glucose-Capillary: 90 mg/dL (ref 70–99)
Glucose-Capillary: 79 mg/dL (ref 70–99)

## 2013-06-30 LAB — BASIC METABOLIC PANEL
CO2: 24 mEq/L (ref 19–32)
Calcium: 9.3 mg/dL (ref 8.4–10.5)
Sodium: 138 mEq/L (ref 135–145)

## 2013-06-30 LAB — CBC
MCH: 27.8 pg (ref 26.0–34.0)
Platelets: 514 10*3/uL — ABNORMAL HIGH (ref 150–400)
RBC: 3.92 MIL/uL (ref 3.87–5.11)
WBC: 11.5 10*3/uL — ABNORMAL HIGH (ref 4.0–10.5)

## 2013-06-30 MED ORDER — LEVOFLOXACIN 500 MG PO TABS
500.0000 mg | ORAL_TABLET | Freq: Every day | ORAL | Status: DC
  Administered 2013-06-30 – 2013-07-01 (×2): 500 mg via ORAL
  Filled 2013-06-30 (×2): qty 1

## 2013-06-30 NOTE — Progress Notes (Addendum)
Occupational Therapy Treatment Patient Details Name: Stormee Duda MRN: [id number] DOB: Feb 08, 1956 Today's Date: 06/30/2013 Time: 9528-4132 OT Time Calculation (min): 27 min  OT Assessment / Plan / Recommendation  History of present illness Patient is a 57 yo female admitted with ischemic Rt leg.  Patient s/p Rt BKA. PMHx DM, PVD, Rt fem-pop   OT comments  Pt demos much improvement from last tx session with decreased level of assist for [facility] transfers and LB ADLs. Pt anxious to go to CIR and very motivated  Follow Up Recommendations  CIR    Barriers to Discharge       Equipment Recommendations  3 in 1 bedside comode;Tub/shower bench    Recommendations for Other Services    Frequency Min 3X/week   Progress towards OT Goals Progress towards OT goals: Progressing toward goals  Plan Discharge plan remains appropriate    Precautions / Restrictions Precautions Precautions: Fall Restrictions Weight Bearing Restrictions: Yes RLE Weight Bearing: Non weight bearing   Pertinent Vitals/Pain 5/10 before activity,7/10 after activity    ADL  Lower Body Bathing: Minimal assistance;Simulated Lower Body Dressing: Performed;Minimal assistance;Moderate assistance Toilet Transfer: Performed;Min guard;Minimal assistance Toilet Transfer Method: Sit to stand;Stand pivot Toilet Transfer Equipment: Bedside commode Toileting - Clothing Manipulation and Hygiene: Minimal assistance Where Assessed - Engineer, mining and Hygiene: Lean right and/or left Equipment Used: Gait belt;Other (comment) (BSC) Transfers/Ambulation Related to ADLs: pt wanted no assist initially or to use RW for sit - stand, however pt anxious and gettng tangled in IV lines    OT Diagnosis:    OT Problem List:   OT Treatment Interventions:     OT Goals(current goals can now be found in the care plan section)    Visit Information  Last OT Received On: 06/30/13 Assistance Needed: +1 History of Present Illness:  Patient is a 57 yo female admitted with ischemic Rt leg.  Patient s/p Rt BKA. PMHx DM, PVD, Rt fem-pop    Subjective Data      Prior Functioning       Cognition  Cognition Arousal/Alertness: Awake/alert Behavior During Therapy: Anxious;Impulsive Overall Cognitive Status: Within Functional Limits for tasks assessed    Mobility  Bed Mobility Bed Mobility: Supine to Sit;Sitting - Scoot to [place] of Bed;Scooting to [facility];Sit to Supine Supine to Sit: 4: Min guard Sitting - Scoot to [place] of Bed: 4: Min guard Sit to Supine: 4: Min guard Scooting to [facility]: With rail;4: Min guard Details for Bed Mobility Assistance: pt wanted no assist initially, however pt anxious and impulsive Transfers Transfers: Sit to Stand;Stand to Sit Sit to Stand: 4:Min A, Min guard;From bed;From chair/3-in-1 Stand to Sit: 4: Min guard;To bed;To chair/3-in-1    Exercises  Other Exercises Other Exercises: encouraged full hip and knee extension when in bed and full knee extension when sitting up in bed   Balance     End of Session OT - End of Session Equipment Utilized During Treatment: Gait belt;Rolling walker;Other (comment) (BSC) Activity Tolerance: Patient tolerated treatment well Patient left: in bed;with call bell/phone within reach;with nursing/sitter in room  GO     Galen Manila 06/30/2013, 2:07 PM

## 2013-06-30 NOTE — Progress Notes (Signed)
Patient ID: [name] [name], female   DOB: 09/01/1956, 57 y.o.   MRN: [id number] [facility] PROGRESS NOTE  Kassondra Geil YQM:[id number] DOB: 31-Oct-1955 DOA: 06/21/2013 PCP: No primary provider on file.  Brief narrative:  57 y.o. female with severe peripheral vascular disease status post femoropopliteal bypass last year, with a recent history of acute ischemic leg with thrombectomy just 2 days ago at [facility], discharged on Coumadin and aspirin who presented with worsening pain in the right foot and black stools for 1-2 days. Apparently, since discharge from [facility], patient continued to have pain in the right foot area and as a result she presented to [facility] emergency room where she was found to have cold and pulseless right foot. She was found to have a hemoglobin of 7.6, which was a significant drop from just a several days prior to this admission. [facility] hospital[facility] was contacted by [facility] ED, both vascular surgery and gastroenterology was also consulted, patient was then transferred to the step down unit for further evaluation and management.   On reviewing the records from recent hospitalization at [facility], and also reviewing the operative note from vascular surgery, it looks like the patient's femoropopliteal graft is occluded, and they have attempted to remove clots 2 days prior to this admission with limited success.   Assessment/Plan:  Ischemic leg - Right /known severe PVD  - known hx of PVD s/p femoropopliteal bypass and recent thrombectomy for the occluded graft on 8/28 at [facility]  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, post Op day #6 - due to concurrent UGI bleed with acute blood loss anemia, she was not started on IV Heparin after weighing risk vs benefits  - Coumadin was attempted but she has had drop in Hg 24 hours after Coumadin initiation,  coumadin now on hold and will check with GI if we can maybe try one more time  Upper GI bleed  - Patient on chronic Coumadin therapy for severe peripheral vascular disease  - EGD on 8/30 - no foci to explain melanotic stools  - c/w PPI  - continue to hold Lovenox and Coumadin  - will follow up on GI recommendations in terms of endoscopy need and is they recommend reinitiating coumadin  Fever  - onset 9/2-9/3, afebrile over 48 hours  - blood cx's negative to date  - CXR 9/4 suggestive of developing infiltrate  - pt has received Vanc and Zosyn for 5 days, will transition to oral Levaquin for 2 more days  Ileus 2/2 right colonic constipation  - resolved with multiple stools after 1 bottle mg citrate  - will continue to advance diet as pt able to tolerate  - continue Dulcolax 10 mg PO Q 12  Acute blood loss anemia  - status post 2 U PRBC transfusion 06/28/2013  - CBC in AM  - continue to hold Coumadin today and if Hg stable over next 24 hours will see with GI if agrees to continue Coumadin  HTN / hypotension  - slightly elevated this AM, monitor closely and add Hydralazine PRN only at this time  Diabetes  -no change in tx plan today  Mild hypokalemia  - k is within normal limits this AM  - repeat BMP in AM  Acute renal failure  - secondary to pre renal etiology  - creatinine is stable ~ 1.1   Code Status:  FULL  Family Communication: no family present at time of exam  Disposition Plan: Ready for discharge to IR   Consultants:  Vasc Surgery  GI - [facility]  Procedures:  EGD - 8/30  R BKA - 8/31  CXR 06/26/2013 Early left basilar infiltrate.  Antibiotics:  Vancomycin and Zosyn 9/3 --> 9/8 Levaquin 9/8 --> 9/9 DVT prophylaxis:  SCD on left lower extremity   HPI/Subjective: No events overnight.   Objective: Filed Vitals:   06/29/13 0550 06/29/13 1403 06/29/13 2156 06/30/13 0455  BP: 175/89 167/94 152/90 156/83  Pulse: 98 120 96 88  Temp: 98.8 F (37.1 C) 98 F (36.7 C) 99.9  F (37.7 C) 99.7 F (37.6 C)  TempSrc: Tympanic Oral Oral   Resp: 20 20 19 19   Height:      Weight: 78.5 kg (173 lb 1 oz)     SpO2: 98% 98% 96% 95%    Intake/Output Summary (Last 24 hours) at 06/30/13 0943 Last data filed at 06/30/13 0130  Gross per 24 hour  Intake   1760 ml  Output   2300 ml  Net   -540 ml    Exam:   General:  Pt is alert, follows commands appropriately, not in acute distress  Cardiovascular: Regular rate and rhythm, S1/S2, no murmurs, no rubs, no gallops  Respiratory: Clear to auscultation bilaterally, no wheezing, no crackles, no rhonchi  Abdomen: Soft, non tender, non distended, bowel sounds present, no guarding  Extremities: Right BKA,  Neuro: Grossly nonfocal  Data Reviewed: Basic Metabolic Panel:  Recent Labs Lab 06/24/13 0425 06/24/13 2134  06/26/13 1140 06/27/13 0520 06/28/13 0605 06/29/13 0530 06/30/13 0445  NA 137 139  < > 134* 139 138 139 138  K 4.6 4.8  < > 4.9 4.2 4.0 4.0 3.5  CL 101 104  < > 100 105 104 103 102  CO2 31 26  < > 26 26 26 26 24   GLUCOSE 130* 106*  < > 138* 135* 110* 106* 91  BUN 11 13  < > 12 14 12 12 11   CREATININE 1.33* 1.17*  < > 1.17* 1.12* 1.11* 1.06 1.06  CALCIUM 8.6 8.3*  < > 8.7 8.4 8.5 9.1 9.3  MG  --  2.4  --   --   --   --   --   --   < > = values in this interval not displayed.  CBC:  Recent Labs Lab 06/24/13 0425 06/24/13 2134  06/26/13 0505 06/27/13 0520 06/28/13 0605 06/29/13 0530 06/30/13 0445  WBC 10.3 11.3*  < > 14.1* 10.9* 9.0 11.1* 11.5*  NEUTROABS  --  7.9*  --   --   --   --   --   --   HGB 8.4* 8.3*  < > 8.7* 7.7* 7.0* 11.3* 10.9*  HCT 25.8* 25.9*  < > 27.2* 23.6* 21.2* 33.1* 33.4*  MCV 86.3 86.9  < > 86.6 85.2 85.5 84.7 85.2  PLT 221 237  < > 333 379 414* 495* 514*  < > = values in this interval not displayed.  CBG:  Recent Labs Lab 06/29/13 0752 06/29/13 1207 06/29/13 1659 06/29/13 2152 06/30/13 0801  GLUCAP 119* 77 120* 112* 97    Recent Results (from the past  240 hour(s))  MRSA PCR SCREENING     Status: Abnormal   Collection Time    06/21/13 11:55 AM      Result Value Range Status   MRSA by PCR POSITIVE (*) NEGATIVE Final  Comment:            The GeneXpert MRSA Assay (FDA     approved for NASAL specimens     only), is one component of a     comprehensive MRSA colonization     surveillance program. It is not     intended to diagnose MRSA     infection nor to guide or     monitor treatment for     MRSA infections.     RESULT CALLED TO, READ BACK BY AND VERIFIED WITH:     GOLDEN C,RN 1610 06/21/13 [facility]  CULTURE, BLOOD (ROUTINE X 2)     Status: None   Collection Time    06/24/13 11:00 PM      Result Value Range Status   Specimen Description BLOOD RIGHT HAND   Final   Special Requests BOTTLES DRAWN AEROBIC ONLY [facility]   Final   Culture  Setup Time     Final   Value: 06/25/2013 05:21     Performed at [facility]   Culture     Final   Value:        BLOOD CULTURE RECEIVED NO GROWTH TO DATE CULTURE WILL BE HELD FOR 5 DAYS BEFORE ISSUING A FINAL NEGATIVE REPORT     Performed at [facility]   Report Status PENDING   Incomplete  CULTURE, BLOOD (ROUTINE X 2)     Status: None   Collection Time    06/24/13 11:10 PM      Result Value Range Status   Specimen Description BLOOD LEFT HAND   Final   Special Requests BOTTLES DRAWN AEROBIC ONLY [facility]   Final   Culture  Setup Time     Final   Value: 06/25/2013 05:21     Performed at [facility]   Culture     Final   Value:        BLOOD CULTURE RECEIVED NO GROWTH TO DATE CULTURE WILL BE HELD FOR 5 DAYS BEFORE ISSUING A FINAL NEGATIVE REPORT     Performed at [facility]   Report Status PENDING   Incomplete  URINE CULTURE     Status: None   Collection Time    06/25/13  5:30 PM      Result Value Range Status   Specimen Description URINE, CATHETERIZED   Final   Special Requests NONE   Final   Culture  Setup Time     Final   Value: 06/26/2013 02:36     Performed at  [facility] Count     Final   Value: NO GROWTH     Performed at [facility]   Culture     Final   Value: NO GROWTH     Performed at [facility]   Report Status 06/27/2013 FINAL   Final     Scheduled Meds: . allopurinol  100 mg Oral Daily  . amitriptyline  50 mg Oral QHS  . atorvastatin  40 mg Oral q1800  . bisacodyl  10 mg Oral Q12H  . docusate sodium  100 mg Oral Daily  . DULoxetine  60 mg Oral Daily  . insulin aspart  0-9 Units Subcutaneous TID WC  . pantoprazole  40 mg Oral BID  . piperacillin-tazobactam (ZOSYN)  IV  3.375 g Intravenous Q8H  . pregabalin  300 mg Oral BID  . sodium chloride  3 mL Intravenous Q12H  . vancomycin  1,000 mg Intravenous Q12H   Continuous Infusions:    Debbora Presto, MD  [facility] Pager [phone]  If 7PM-7AM, please contact night-coverage [email or web address] Password [email or web address] 06/30/2013, 9:43 AM   LOS: 9 days

## 2013-06-30 NOTE — Progress Notes (Signed)
I met with pt at bedside. I await updated therapy progress and medical readiness to assist in planning rehab dispo. Patient's cell phone is dead and she does not have a Advertising account planner. Her friend, [name], is long distance and she has not had contact. I contacted [name] and left her a message of how to get in touch with pt to assist in planning disposition. I will follow up later today. [phone]

## 2013-06-30 NOTE — Progress Notes (Signed)
[facility] PROGRESS NOTE Subjective Pt not eating well. No stool to check past few days.   Objective: Vital signs in last 24 hours: Temp:  [99.1 F (37.3 C)-99.9 F (37.7 C)] 99.1 F (37.3 C) (09/08 1354) Pulse Rate:  [88-96] 90 (09/08 1354) Resp:  [19-20] 20 (09/08 1354) BP: (152-156)/(83-90) 154/86 mmHg (09/08 1354) SpO2:  [95 %-98 %] 98 % (09/08 1354) Last BM Date: 06/26/13  Intake/Output from previous day: 09/07 0701 - 09/08 0700 In: 1760 [P.O.:360; I.V.:1300; IV Piggyback:100] Out: 2300 [Urine:2300] Intake/Output this shift: Total I/O In: 600 [IV Piggyback:600] Out: -   PE:  Abdomen--soft nontender,  Rectal--no impaction, stool brown, sent for FOB  Lab Results:  Recent Labs  06/28/13 0605 06/29/13 0530 06/30/13 0445  WBC 9.0 11.1* 11.5*  HGB 7.0* 11.3* 10.9*  HCT 21.2* 33.1* 33.4*  PLT 414* 495* 514*   BMET  Recent Labs  06/28/13 0605 06/29/13 0530 06/30/13 0445  NA 138 139 138  K 4.0 4.0 3.5  CL 104 103 102  CO2 26 26 24   CREATININE 1.11* 1.06 1.06   LFT No results found for this basename: PROT, AST, ALT, ALKPHOS, BILITOT, BILIDIR, IBILI,  in the last 72 hours PT/INR  Recent Labs  06/28/13 0605 06/29/13 0530 06/30/13 0445  LABPROT 23.2* 24.8* 21.1*  INR 2.14* 2.33* 1.89*   PANCREAS No results found for this basename: LIPASE,  in the last 72 hours       Studies/Results: No results found.  Medications: I have reviewed the patient's current medications.  Assessment/Plan: 1. GI Bleed. No gross bleeding at present.  EGD negative, stools brown. Would go ahead and start lovenox ? Resume coumadin. Would be best if possible to hold colonoscopy until after recovers from amputation, but if bleeds may need to do sooner   Shamarie Call JR,Alizzon Dioguardi L 06/30/2013, 5:06 PM

## 2013-06-30 NOTE — PMR Pre-admission (Signed)
PMR Admission Coordinator Pre-Admission Assessment  Patient: [name] [name] is an 57 y.o., female MRN: [id number] DOB: [birth year] Height: 5\' 6"  (167.6 cm) Weight: 72 kg (158 lb 11.7 oz)              Insurance Information HMO: yes    PPO:      PCP:      IPA:      80/20:      OTHER: medicare replacement policy PRIMARY: AARP Medicare      Policy#: [id number]      Subscriber: pt CM Name: Oretha Milch      Phone#: [phone]     Fax#: [phone] Pre-Cert#: [id number]      Employer: disabled. To be followed by onsite CM, Kathleene Hazel at [phone]* Benefits:  Phone #: [phone]     Name: 06/24/13 Eff. Date: 10/23/10     Deduct: $147      Out of Pocket Max: $6700      Life Max: none CIR: $1216 copayment per admitSNF: no copay days 1 through 20; $152 per day copay days 21 -100 Outpatient: 80%     Co-Pay: 20% Home Health: 100%      Co-Pay: none DME: 80%     Co-Pay: 20% Providers: in network  SECONDARY: Medicaid      Policy#: [id number]      Subscriber: pt  Emergency Contact Information Contact Information   Name Relation Home Work Mobile   Jasper Friend [phone]       Current Medical History  Patient Admitting Diagnosis: right BKA  History of Present Illness: [name] [name] is a 57 y.o. female with history of severe PVD with FPBG and RLL thrombectomy at [facility] hospital few days PTA on 06/21/13 with ischemic RLE with foot pain and numbness as well as black stools. She underwent EGD by Dr. Madilyn Fireman and this was negative for source of bleeding with recommendations to monitor H/H and colonoscopy is indicated. She was evaluated by Dr. 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  Patient with fever and placed on Vanc and Zosyn for 5 days. Fever resolved and switched to oral antibiotic. Ileus resolved with Mg Citrated treatment. Needs bowel regimen. Acute blood loss anemia and transfused. Coumadin on hold and will ask GI when to continue.  Past Medical History  History reviewed. No  pertinent past medical history.  Family History  family history is not on file.  Prior Rehab/Hospitalizations: none   Current Medications  Current facility-administered medications:acetaminophen (TYLENOL) suppository 650 mg, 650 mg, Rectal, Q6H PRN, Maretta Bees, MD;  acetaminophen (TYLENOL) tablet 650 mg, 650 mg, Oral, Q6H PRN, Maretta Bees, MD, 650 mg at 06/28/13 1528;  albuterol (PROVENTIL) (5 MG/ML) 0.5% nebulizer solution 2.5 mg, 2.5 mg, Nebulization, Q2H PRN, Maretta Bees, MD allopurinol (ZYLOPRIM) tablet 100 mg, 100 mg, Oral, Daily, Chuck Hint, MD, 100 mg at 06/30/13 1028;  alum & mag hydroxide-simeth (MAALOX/MYLANTA) 200-200-20 MG/5ML suspension 15-30 mL, 15-30 mL, Oral, Q2H PRN, Chuck Hint, MD;  amitriptyline (ELAVIL) tablet 50 mg, 50 mg, Oral, QHS, Chuck Hint, MD, 50 mg at 06/30/13 2142 atorvastatin (LIPITOR) tablet 40 mg, 40 mg, Oral, q1800, Chuck Hint, MD, 40 mg at 06/30/13 1756;  bisacodyl (DULCOLAX) EC tablet 10 mg, 10 mg, Oral, Q12H, Calvert Cantor, MD, 10 mg at 06/30/13 2142;  docusate sodium (COLACE) capsule 100 mg, 100 mg, Oral, Daily, Chuck Hint, MD, 100 mg at 06/30/13 1028;  DULoxetine (CYMBALTA) DR capsule 60 mg, 60 mg, Oral, Daily,  Chuck Hint, MD, 60 mg at 06/30/13 1028 guaiFENesin-dextromethorphan (ROBITUSSIN DM) 100-10 MG/5ML syrup 15 mL, 15 mL, Oral, Q4H PRN, Chuck Hint, MD;  hydrALAZINE (APRESOLINE) injection 10 mg, 10 mg, Intravenous, Q2H PRN, Chuck Hint, MD;  insulin aspart (novoLOG) injection 0-9 Units, 0-9 Units, Subcutaneous, TID WC, Lonia Blood, MD, 1 Units at 06/27/13 0840;  levofloxacin [facility]) tablet 500 mg, 500 mg, Oral, Daily, Dorothea Ogle, MD, 500 mg at 06/30/13 1612 metoprolol (LOPRESSOR) injection 5 mg, 5 mg, Intravenous, Q6H PRN, Leda Gauze, NP, 5 mg at 06/26/13 1810;  nicotine polacrilex (NICORETTE) gum 2 mg, 2 mg, Oral, PRN, Dorothea Ogle, MD;   ondansetron [facility]) injection 4 mg, 4 mg, Intravenous, Q6H PRN, Maretta Bees, MD;  ondansetron (ZOFRAN) tablet 4 mg, 4 mg, Oral, Q6H PRN, Maretta Bees, MD oxyCODONE-acetaminophen (PERCOCET/ROXICET) 5-325 MG per tablet 1-2 tablet, 1-2 tablet, Oral, Q4H PRN, Dorothea Ogle, MD, 2 tablet at 07/01/13 [phone];  pantoprazole (PROTONIX) EC tablet 40 mg, 40 mg, Oral, BID, Maretta Bees, MD, 40 mg at 06/30/13 2142;  phenol (CHLORASEPTIC) mouth spray 1 spray, 1 spray, Mouth/Throat, PRN, Chuck Hint, MD pregabalin (LYRICA) capsule 300 mg, 300 mg, Oral, BID, Chuck Hint, MD, 300 mg at 06/30/13 2142;  sodium chloride 0.9 % injection 3 mL, 3 mL, Intravenous, Q12H, Maretta Bees, MD, 3 mL at 06/30/13 2145  Patients Current Diet: Carb Control  Precautions / Restrictions Precautions Precautions: Fall Restrictions Weight Bearing Restrictions: Yes RLE Weight Bearing: Non weight bearing   Prior Activity Level Patient got out into the community everyday. Walked did not drive.  Home Assistive Devices / Equipment Home Assistive Devices/Equipment: Shower chair with back;Walker (specify type);Cane (specify quad or straight) Home Equipment: Bedside commode;Shower seat  Prior Functional Level Prior Function Level of Independence: Independent Comments: Does not drive (fell 3 times PTA)  Current Functional Level Cognition  Overall Cognitive Status: Within Functional Limits for tasks assessed Orientation Level: Oriented X4    Extremity Assessment (includes Sensation/Coordination)          ADLs  Grooming: Performed;Set up;Wash/dry face;Wash/dry hands Where Assessed - Grooming: Unsupported sitting Upper Body Bathing: Set up;Performed Where Assessed - Upper Body Bathing: Unsupported sitting Lower Body Bathing: Minimal assistance;Simulated Where Assessed - Lower Body Bathing: Supported sit to stand Upper Body Dressing: Performed;Minimal assistance;Other (comment) (min A  due to IV lines R UE) Where Assessed - Upper Body Dressing: Unsupported sitting Lower Body Dressing: Performed;Minimal assistance;Moderate assistance Where Assessed - Lower Body Dressing: Supported sit to stand Toilet Transfer: Performed;Min guard;Minimal Dentist Method: Sit to stand;Stand pivot Acupuncturist: Bedside commode Toileting - Clothing Manipulation and Hygiene: Minimal assistance Where Assessed - Engineer, mining and Hygiene: Lean right and/or left Tub/Shower Transfer Method: Not assessed Equipment Used: Gait belt;Other (comment) (BSC) Transfers/Ambulation Related to ADLs: pt wanted no assist initially or to use RW for sit - stand, however pt anxious and gettng tangled in IV lines ADL Comments: Pt very anxious about doing ADLs for herself    Mobility  Bed Mobility: Supine to Sit;Sit to Supine Supine to Sit: 4: Min guard Supine to Sit: Patient Percentage: 50% Sitting - Scoot to Edge of Bed: 4: Min guard Sit to Supine: 5: Supervision;With rail;HOB flat Scooting to [facility]: With rail;4: Min guard    Transfers  Transfers: Sit to Stand;Stand to Sit Sit to Stand: 4: Min assist Stand to Sit: 4: Min assist Stand Pivot Transfers: 3: Mod assist  Squat Pivot Transfers: 1: +2 Total assist Squat Pivot Transfers: Patient Percentage: 60%    Ambulation / Gait / Stairs / Wheelchair Mobility  Ambulation/Gait Ambulation/Gait Assistance: Not tested (comment) Ambulation/Gait: Patient Percentage: 80% Ambulation Distance (Feet): 3 Feet Assistive device: Rolling walker Ambulation/Gait Assistance Details: Better use of bil UEs to offload LLE to allow her to advance LLE/hop.Pt likely could have ambulated greater distance, however she did not want to. Gait Pattern: Step-to pattern;Decreased stride length;Trunk flexed Gait velocity: decreased Stairs: No Wheelchair Mobility Wheelchair Mobility: No    Posture / Balance Static Sitting Balance Static  Sitting - Balance Support: No upper extremity supported Static Sitting - Level of Assistance: 5: Stand by assistance Static Sitting - Comment/# of Minutes: 4 Dynamic Sitting Balance Dynamic Sitting - Balance Support: No upper extremity supported;During functional activity;Feet unsupported Dynamic Sitting - Level of Assistance: 5: Stand by assistance Static Standing Balance Static Standing - Balance Support: Bilateral upper extremity supported Static Standing - Level of Assistance: 4: Min assist Static Standing - Comment/# of Minutes: stood x3 approx 3 minutes each performing lower extremity exercises; limited by pain    Special needs/care consideration Bowel mgmt:continent 07/01/13 Bladder mgmt:continent Diabetic mgmt needs more education. Not IDDM but pt asks for sugar with her meals and does not understand why she is not allowed.   Previous Home Environment Living Arrangements: Other relatives (Grandson - 49 yo)  Lives With: Family;Other (Comment) (adult son and 37 yo grandson) Available Help at Discharge: Available PRN/intermittently Type of Home: House Home Layout: One level Home Access: Stairs to enter Entrance Stairs-Rails: Doctor, general practice of Steps: 3 Bathroom Shower/Tub: Forensic scientist: Standard Bathroom Accessibility: No Home Care Services: No  Discharge Living Setting Plans for Discharge Living Setting: Patient's home;Alone Type of Home at Discharge: House Discharge Home Layout: One level Discharge Home Access: Stairs to enter Entrance Stairs-Rails: Right;Left Entrance Stairs-Number of Steps: 3 steps Discharge Bathroom Shower/Tub: Tub/shower unit Discharge Bathroom Toilet: Standard Discharge Bathroom Accessibility: No Does the patient have any problems obtaining your medications?: No Patient states there is one step onto front and then one step into home. Wheelchair can get through house except for the bathroom due to size of  home.  Social/Family/Support Systems Patient Roles: Parent Contact Information: Clarene Essex, neighbor and friend Anticipated Caregiver: [name] Anticipated Caregiver's Contact Information: [phone] Ability/Limitations of Caregiver: prn Caregiver Availability: Intermittent Discharge Plan Discussed with Primary Caregiver: Yes Is Caregiver In Agreement with Plan?: Yes Does Caregiver/Family have Issues with Lodging/Transportation while Pt is in Rehab?: No Lucila Maine, Fayrene Fearing, is with his biological Mother since pt hospitalized. Pt's friend and neighbor, [name], across the street and can give prn assist. Pt also has cousin, Anadelia Kintz, who can help once pt home. Son in jail now since pt in hospital.  Goals/Additional Needs Patient/Family Goal for Rehab: supervision to Mod I with PT and OT Expected length of stay: [facility] 7 to 10 days Pt/Family Agrees to Admission and willing to participate: Yes Program Orientation Provided & Reviewed with Pt/Caregiver Including Roles  & Responsibilities: Yes   Decrease burden of Care through IP rehab admission: n/a  Possible need for SNF placement upon discharge:if pt does not reach Mod I there is a possibility that we will recommend SNF. Patient will refuse for she has a fear of SNF. She had a sister with an amputation who went SNF and eventually died from complications.   Patient Condition: This patient's medical and functional status has changed since the consult dated: 06/23/13 in  which the Rehabilitation Physician determined and documented that the patient's condition is appropriate for intensive rehabilitative care in an inpatient rehabilitation facility. See "History of Present Illness" (above) for medical update. Functional changes are: overall min assist transfers. Patient's medical and functional status update has been discussed with the Rehabilitation physician and patient remains appropriate for inpatient rehabilitation. Will admit to inpatient rehab  today.  Preadmission Screen Completed By:  Clois Dupes, 07/01/2013 10:13 AM ______________________________________________________________________   Discussed status with Dr. Riley Kill on 07/01/13 at  1012 and received telephone approval for admission today.  Admission Coordinator:  Clois Dupes, time 1610 Date 07/01/2013.

## 2013-06-30 NOTE — Progress Notes (Signed)
Noted updated therapy this afternoon. I will update [employer] medicare insurance on pt's progress to seek approval to admit to inpt rehab tomorrow. I will follow up in the morning. [phone]

## 2013-06-30 NOTE — Progress Notes (Signed)
Physical Therapy Treatment Patient Details Name: [name] MRN: [id number] DOB: June 08, 1956 Today's Date: 06/30/2013 Time: 7829-5621 PT Time Calculation (min): 24 min  PT Assessment / Plan / Recommendation  History of Present Illness Patient is a 57 yo female admitted with ischemic Rt leg.  Patient s/p Rt BKA. PMHx DM, PVD, Rt fem-pop   PT Comments   Continues to favor RLE and hold it in flexed position in the bed. Kept repeating "all this time I thought I was getting it straight." Encouraged quad sets and side lying hip extension for HEP. Anxious and painful so did not transfer out of bed today.   Follow Up Recommendations  CIR     Does the patient have the potential to tolerate intense rehabilitation     Barriers to Discharge        Equipment Recommendations  Rolling walker with 5" wheels;Wheelchair (measurements PT);Wheelchair cushion (measurements PT)    Recommendations for Other Services Rehab consult  Frequency Min 3X/week   Progress towards PT Goals Progress towards PT goals: Progressing toward goals  Plan Current plan remains appropriate    Precautions / Restrictions Precautions Precautions: Fall Restrictions Weight Bearing Restrictions: Yes RLE Weight Bearing: Non weight bearing   Pertinent Vitals/Pain Reports moderate pain, RN made aware and in to see patient when I left    Mobility  Bed Mobility Bed Mobility: Supine to Sit;Sit to Supine Supine to Sit: 4: Min guard Sitting - Scoot to Edge of Bed: 4: Min guard Sit to Supine: 5: Supervision;With rail;HOB flat Scooting to [facility]: With rail;4: Min guard Details for Bed Mobility Assistance: sequencing cues for safety and efficiency Transfers Transfers: Sit to Stand;Stand to Sit Sit to Stand: 4: Min assist Stand to Sit: 4: Min assist Details for Transfer Assistance: positional cues for foot and hand placement, calming cues as well as facilitation for stability and anterior weight shift as she stood; stood x3 for  strengthening and activity tolerance; focused on trunk and lower extremity extension Ambulation/Gait Ambulation/Gait Assistance: Not tested (comment)    Exercises Amputee Exercises Quad Sets: AROM;Both;5 reps;Supine;Limitations Quad Sets Limitations: requires max cues to activate quad and extend fully Hip Extension: AAROM;Right;10 reps;Sidelying Knee Extension: AAROM;Right;10 reps;Supine;Limitations Knee Extension Limitations: cues for full extension, pt unaware all this time she has not been fully extending her knee Other Exercises Other Exercises: encouraged full hip and knee extension when in bed and full knee extension when sitting up in bed     PT Goals (current goals can now be found in the care plan section)    Visit Information  Last PT Received On: 06/30/13 Assistance Needed: +1 (+2 for gait) History of Present Illness: Patient is a 57 yo female admitted with ischemic Rt leg.  Patient s/p Rt BKA. PMHx DM, PVD, Rt fem-pop    Subjective Data      Cognition  Cognition Arousal/Alertness: Awake/alert Behavior During Therapy: Anxious;Impulsive Overall Cognitive Status: Within Functional Limits for tasks assessed    Balance  Static Standing Balance Static Standing - Balance Support: Bilateral upper extremity supported Static Standing - Level of Assistance: 4: Min assist Static Standing - Comment/# of Minutes: stood x3 approx 3 minutes each performing lower extremity exercises; limited by pain  End of Session PT - End of Session Equipment Utilized During Treatment: Gait belt Activity Tolerance: Patient limited by pain Patient left: in bed;with call bell/phone within reach;with bed alarm set Nurse Communication: Mobility status   GP     Ludger Nutting 06/30/2013, 4:51 PM

## 2013-07-01 ENCOUNTER — Encounter (HOSPITAL_COMMUNITY): Payer: Self-pay | Admitting: *Deleted

## 2013-07-01 ENCOUNTER — Inpatient Hospital Stay (HOSPITAL_COMMUNITY)
Admission: RE | Admit: 2013-07-01 | Discharge: 2013-07-11 | DRG: 945 | Disposition: A | Discharge status: Home Health | Payer: PRIVATE HEALTH INSURANCE | Source: Intra-hospital | Attending: Physical Medicine & Rehabilitation | Admitting: Physical Medicine & Rehabilitation

## 2013-07-01 ENCOUNTER — Encounter (HOSPITAL_COMMUNITY): Payer: Self-pay | Admitting: Physical Medicine and Rehabilitation

## 2013-07-01 DIAGNOSIS — K5900 Constipation, unspecified: Secondary | ICD-10-CM

## 2013-07-01 DIAGNOSIS — D72829 Elevated white blood cell count, unspecified: Secondary | ICD-10-CM | POA: Diagnosis not present

## 2013-07-01 DIAGNOSIS — F17200 Nicotine dependence, unspecified, uncomplicated: Secondary | ICD-10-CM

## 2013-07-01 DIAGNOSIS — Z7901 Long term (current) use of anticoagulants: Secondary | ICD-10-CM | POA: Diagnosis not present

## 2013-07-01 DIAGNOSIS — R918 Other nonspecific abnormal finding of lung field: Secondary | ICD-10-CM | POA: Diagnosis not present

## 2013-07-01 DIAGNOSIS — I739 Peripheral vascular disease, unspecified: Secondary | ICD-10-CM | POA: Diagnosis not present

## 2013-07-01 DIAGNOSIS — I10 Essential (primary) hypertension: Secondary | ICD-10-CM | POA: Diagnosis not present

## 2013-07-01 DIAGNOSIS — S88119A Complete traumatic amputation at level between knee and ankle, unspecified lower leg, initial encounter: Secondary | ICD-10-CM | POA: Diagnosis not present

## 2013-07-01 DIAGNOSIS — G609 Hereditary and idiopathic neuropathy, unspecified: Secondary | ICD-10-CM | POA: Diagnosis not present

## 2013-07-01 DIAGNOSIS — L98499 Non-pressure chronic ulcer of skin of other sites with unspecified severity: Secondary | ICD-10-CM

## 2013-07-01 DIAGNOSIS — E785 Hyperlipidemia, unspecified: Secondary | ICD-10-CM | POA: Diagnosis not present

## 2013-07-01 DIAGNOSIS — Z5189 Encounter for other specified aftercare: Principal | ICD-10-CM

## 2013-07-01 DIAGNOSIS — E119 Type 2 diabetes mellitus without complications: Secondary | ICD-10-CM | POA: Diagnosis present

## 2013-07-01 DIAGNOSIS — Z79899 Other long term (current) drug therapy: Secondary | ICD-10-CM | POA: Diagnosis not present

## 2013-07-01 DIAGNOSIS — G547 Phantom limb syndrome without pain: Secondary | ICD-10-CM

## 2013-07-01 DIAGNOSIS — D62 Acute posthemorrhagic anemia: Secondary | ICD-10-CM | POA: Diagnosis not present

## 2013-07-01 DIAGNOSIS — I998 Other disorder of circulatory system: Secondary | ICD-10-CM | POA: Diagnosis present

## 2013-07-01 LAB — CULTURE, BLOOD (ROUTINE X 2): Culture: NO GROWTH

## 2013-07-01 LAB — CBC
Hemoglobin: 11.3 g/dL — ABNORMAL LOW (ref 12.0–15.0)
Platelets: 571 10*3/uL — ABNORMAL HIGH (ref 150–400)
RBC: 4 MIL/uL (ref 3.87–5.11)
WBC: 11.5 10*3/uL — ABNORMAL HIGH (ref 4.0–10.5)

## 2013-07-01 LAB — PROTIME-INR: INR: 2.06 — ABNORMAL HIGH (ref 0.00–1.49)

## 2013-07-01 LAB — BASIC METABOLIC PANEL
CO2: 26 mEq/L (ref 19–32)
Calcium: 9.4 mg/dL (ref 8.4–10.5)
Chloride: 105 mEq/L (ref 96–112)
Glucose, Bld: 86 mg/dL (ref 70–99)
Potassium: 3.5 mEq/L (ref 3.5–5.1)
Sodium: 141 mEq/L (ref 135–145)

## 2013-07-01 LAB — GLUCOSE, CAPILLARY: Glucose-Capillary: 90 mg/dL (ref 70–99)

## 2013-07-01 MED ORDER — METHOCARBAMOL 500 MG PO TABS
500.0000 mg | ORAL_TABLET | Freq: Four times a day (QID) | ORAL | Status: DC | PRN
  Administered 2013-07-02 – 2013-07-03 (×3): 500 mg via ORAL
  Filled 2013-07-01 (×3): qty 1

## 2013-07-01 MED ORDER — ALLOPURINOL 100 MG PO TABS
100.0000 mg | ORAL_TABLET | Freq: Every day | ORAL | Status: DC
  Administered 2013-07-02 – 2013-07-11 (×10): 100 mg via ORAL
  Filled 2013-07-01 (×11): qty 1

## 2013-07-01 MED ORDER — ALBUTEROL SULFATE (5 MG/ML) 0.5% IN NEBU: 2.5000 mg | INHALATION_SOLUTION | RESPIRATORY_TRACT | Status: DC | PRN

## 2013-07-01 MED ORDER — OXYCODONE HCL ER 10 MG PO T12A
10.0000 mg | EXTENDED_RELEASE_TABLET | Freq: Two times a day (BID) | ORAL | Status: DC
  Administered 2013-07-01 – 2013-07-03 (×5): 10 mg via ORAL
  Filled 2013-07-01 (×5): qty 1

## 2013-07-01 MED ORDER — LEVOFLOXACIN 500 MG PO TABS
500.0000 mg | ORAL_TABLET | Freq: Every day | ORAL | Status: AC
  Administered 2013-07-02: 500 mg via ORAL
  Filled 2013-07-01: qty 1

## 2013-07-01 MED ORDER — PROCHLORPERAZINE EDISYLATE 5 MG/ML IJ SOLN
5.0000 mg | Freq: Four times a day (QID) | INTRAMUSCULAR | Status: DC | PRN
  Filled 2013-07-01: qty 2

## 2013-07-01 MED ORDER — AMITRIPTYLINE HCL 50 MG PO TABS
50.0000 mg | ORAL_TABLET | Freq: Every day | ORAL | Status: DC
  Administered 2013-07-01 – 2013-07-10 (×10): 50 mg via ORAL
  Filled 2013-07-01 (×11): qty 1

## 2013-07-01 MED ORDER — PANTOPRAZOLE SODIUM 40 MG PO TBEC: 40.0000 mg | DELAYED_RELEASE_TABLET | Freq: Two times a day (BID) | ORAL | Status: DC

## 2013-07-01 MED ORDER — SENNOSIDES-DOCUSATE SODIUM 8.6-50 MG PO TABS
1.0000 | ORAL_TABLET | Freq: Two times a day (BID) | ORAL | Status: DC
  Administered 2013-07-02 – 2013-07-10 (×16): 1 via ORAL
  Filled 2013-07-01 (×25): qty 1

## 2013-07-01 MED ORDER — NICOTINE POLACRILEX 2 MG MT GUM: 2.0000 mg | CHEWING_GUM | OROMUCOSAL | Status: AC | PRN

## 2013-07-01 MED ORDER — ACETAMINOPHEN 325 MG PO TABS
325.0000 mg | ORAL_TABLET | ORAL | Status: DC | PRN
  Filled 2013-07-01: qty 2

## 2013-07-01 MED ORDER — BISACODYL 10 MG RE SUPP: 10.0000 mg | Freq: Every day | RECTAL | Status: DC | PRN

## 2013-07-01 MED ORDER — PANTOPRAZOLE SODIUM 40 MG PO TBEC
40.0000 mg | DELAYED_RELEASE_TABLET | Freq: Two times a day (BID) | ORAL | Status: DC
  Administered 2013-07-01 – 2013-07-11 (×20): 40 mg via ORAL
  Filled 2013-07-01 (×27): qty 1

## 2013-07-01 MED ORDER — WARFARIN - PHARMACIST DOSING INPATIENT: Freq: Every day | Status: DC

## 2013-07-01 MED ORDER — NICOTINE POLACRILEX 2 MG MT GUM
2.0000 mg | CHEWING_GUM | OROMUCOSAL | Status: DC | PRN
  Filled 2013-07-01: qty 1

## 2013-07-01 MED ORDER — FLEET ENEMA 7-19 GM/118ML RE ENEM
1.0000 | ENEMA | Freq: Once | RECTAL | Status: AC | PRN
  Filled 2013-07-01: qty 1

## 2013-07-01 MED ORDER — DULOXETINE HCL 60 MG PO CPEP
60.0000 mg | ORAL_CAPSULE | Freq: Every day | ORAL | Status: DC
  Administered 2013-07-02 – 2013-07-11 (×10): 60 mg via ORAL
  Filled 2013-07-01 (×11): qty 1

## 2013-07-01 MED ORDER — ATORVASTATIN CALCIUM 40 MG PO TABS
40.0000 mg | ORAL_TABLET | Freq: Every day | ORAL | Status: DC
  Administered 2013-07-01 – 2013-07-10 (×10): 40 mg via ORAL
  Filled 2013-07-01 (×12): qty 1

## 2013-07-01 MED ORDER — GUAIFENESIN-DM 100-10 MG/5ML PO SYRP
15.0000 mL | ORAL_SOLUTION | ORAL | Status: DC | PRN
Start: 2013-07-01 — End: 2013-07-11

## 2013-07-01 MED ORDER — OXYCODONE HCL 5 MG PO TABS
5.0000 mg | ORAL_TABLET | ORAL | Status: DC | PRN
  Administered 2013-07-01 – 2013-07-02 (×3): 10 mg via ORAL
  Filled 2013-07-01 (×4): qty 2

## 2013-07-01 MED ORDER — INSULIN ASPART 100 UNIT/ML ~~LOC~~ SOLN
0.0000 [IU] | Freq: Three times a day (TID) | SUBCUTANEOUS | Status: DC
  Administered 2013-07-04 – 2013-07-07 (×5): 2 [IU] via SUBCUTANEOUS
  Administered 2013-07-08: 7 [IU] via SUBCUTANEOUS
  Administered 2013-07-08 – 2013-07-10 (×2): 1 [IU] via SUBCUTANEOUS

## 2013-07-01 MED ORDER — WARFARIN SODIUM 4 MG PO TABS
4.0000 mg | ORAL_TABLET | Freq: Once | ORAL | Status: AC
  Administered 2013-07-01: 4 mg via ORAL
  Filled 2013-07-01: qty 1

## 2013-07-01 MED ORDER — PROCHLORPERAZINE 25 MG RE SUPP
12.5000 mg | Freq: Four times a day (QID) | RECTAL | Status: DC | PRN
  Filled 2013-07-01: qty 1

## 2013-07-01 MED ORDER — TRAZODONE HCL 50 MG PO TABS: 25.0000 mg | ORAL_TABLET | Freq: Every evening | ORAL | Status: DC | PRN

## 2013-07-01 MED ORDER — PREGABALIN 75 MG PO CAPS
300.0000 mg | ORAL_CAPSULE | Freq: Two times a day (BID) | ORAL | Status: DC
  Administered 2013-07-01 – 2013-07-11 (×19): 300 mg via ORAL
  Filled 2013-07-01 (×19): qty 4

## 2013-07-01 MED ORDER — PHENOL 1.4 % MT LIQD
1.0000 | OROMUCOSAL | Status: DC | PRN
  Filled 2013-07-01: qty 177

## 2013-07-01 MED ORDER — OXYCODONE-ACETAMINOPHEN 5-325 MG PO TABS: 1.0000 | ORAL_TABLET | ORAL | Status: DC | PRN

## 2013-07-01 MED ORDER — PROCHLORPERAZINE MALEATE 5 MG PO TABS
5.0000 mg | ORAL_TABLET | Freq: Four times a day (QID) | ORAL | Status: DC | PRN
  Filled 2013-07-01: qty 2

## 2013-07-01 MED ORDER — ALUM & MAG HYDROXIDE-SIMETH 200-200-20 MG/5ML PO SUSP: 15.0000 mL | ORAL | Status: DC | PRN

## 2013-07-01 MED ORDER — DIPHENHYDRAMINE HCL 12.5 MG/5ML PO ELIX
12.5000 mg | ORAL_SOLUTION | Freq: Four times a day (QID) | ORAL | Status: DC | PRN
  Filled 2013-07-01: qty 10

## 2013-07-01 NOTE — Progress Notes (Addendum)
ANTICOAGULATION CONSULT NOTE - Initial Consult  Pharmacy Consult for coumadin Indication: severe PVD and ischemia  No Known Allergies  Patient Measurements:   Heparin Dosing Weight:   Vital Signs: Temp: 98.4 F (36.9 C) (09/09 0542) Temp src: Oral (09/09 0542) BP: 130/78 mmHg (09/09 0542) Pulse Rate: 83 (09/09 0542)  Labs:  Recent Labs  06/29/13 0530 06/30/13 0445 07/01/13 0520 07/01/13 0738  HGB 11.3* 10.9* 11.3*  --   HCT 33.1* 33.4* 34.2*  --   PLT 495* 514* 571*  --   LABPROT 24.8* 21.1*  --  22.6*  INR 2.33* 1.89*  --  2.06*  CREATININE 1.06 1.06 1.11*  --     The CrCl is unknown because both a height and weight (above a minimum accepted value) are required for this calculation.   Medical History: Past Medical History  Diagnosis Date  . PVD (peripheral vascular disease) with claudication   . HTN (hypertension)   . Diabetes mellitus   . Peripheral neuropathy     Medications:  Scheduled:  . [START ON 07/02/2013] allopurinol  100 mg Oral Daily  . amitriptyline  50 mg Oral QHS  . atorvastatin  40 mg Oral q1800  . [START ON 07/02/2013] DULoxetine  60 mg Oral Daily  . insulin aspart  0-9 Units Subcutaneous TID WC  . [START ON 07/02/2013] levofloxacin  500 mg Oral Daily  . OxyCODONE  10 mg Oral Q12H  . pantoprazole  40 mg Oral BID  . pregabalin  300 mg Oral BID  . senna-docusate  1 tablet Oral BID   Infusions:    Assessment: 74 YOF recently admitted to [facility] where she underwent thrombolysis due to occluded fem-pop bypass (done in 2013). She was discharged home on ASA and Coumadin on 06/20/13. Patient presented to [facility] the following day with complaints of worsening pain in the right foot and black stools for 1-2 days. Was on Lovenox and Coumadin for severe PVD and ischemia. Was on hold at one point due to GI bleed.  Now will resume  Last dose of coumadin was 7.5mg  on 09/05.  INR today was 2.06    Goal of Therapy:  INR 2-3  Monitor platelets by  anticoagulation protocol: Yes   Plan:  - Coumadin 4mg  PO today - Monitor for bleeding closely - Daily PT/INR   Glorianne Proctor, Tsz-Yin 07/01/2013,3:12 PM

## 2013-07-01 NOTE — Interval H&P Note (Signed)
[name] [name] was admitted today to Inpatient Rehabilitation with the diagnosis of right BKA.  The patient's history has been reviewed, patient examined, and there is no change in status.  Patient continues to be appropriate for intensive inpatient rehabilitation.  I have reviewed the patient's chart and labs.  Questions were answered to the patient's satisfaction.  SWARTZ,ZACHARY T 07/01/2013, 7:16 PM

## 2013-07-01 NOTE — Progress Notes (Signed)
Insurance has approved and bed is available to admit pt to inpt rehab today. Patient is in agreement. I have alerted Dr Izola Price as well as RN CM. [phone]

## 2013-07-01 NOTE — Progress Notes (Signed)
Physical Therapy Treatment Patient Details Name: [name] [name] MRN: [id number] DOB: December 01, 1955 Today's Date: 07/01/2013 Time: 6578-4696 PT Time Calculation (min): 32 min  PT Assessment / Plan / Recommendation  History of Present Illness Patient is a 57 yo female admitted with ischemic Rt leg.  Patient s/p Rt BKA. PMHx DM, PVD, Rt fem-pop   PT Comments   Pt encouraged by her progress today. Required frequent instructional cues to complete tasks (and overall min assist). Again educated on importance of and how to incr extension of Rt hip and knee (pt lying supine with RLE abducted, flexed and externally rotated on arrival). Anticipate she will progress well on Rehab.   Follow Up Recommendations  CIR     Does the patient have the potential to tolerate intense rehabilitation     Barriers to Discharge        Equipment Recommendations  Rolling walker with 5" wheels;Wheelchair (measurements PT);Wheelchair cushion (measurements PT)    Recommendations for Other Services    Frequency Min 3X/week   Progress towards PT Goals Progress towards PT goals: Progressing toward goals  Plan Current plan remains appropriate    Precautions / Restrictions Precautions Precautions: Fall   Pertinent Vitals/Pain Rt BKA (specifically phantom pain)--educated on techniques of rubbing, tapping, flexing Lt foot to decr pain/phantom sensations in her Rt BKA. Also educated on use of lumbar massage to decrease phantom pain. Pt appreciative of education.     Mobility  Bed Mobility Bed Mobility: Supine to Sit;Sitting - Scoot to Edge of Bed;Rolling Left Rolling Left: 4: Min assist Supine to Sit: 5: Supervision;HOB elevated Sitting - Scoot to Edge of Bed: 5: Supervision;With rail Details for Bed Mobility Assistance: pt moving more slowly, safely; supervision due to recent impulsivity Transfers Transfers: Sit to Stand;Stand to Sit;Stand Pivot Transfers Sit to Stand: 4: Min assist;With upper extremity assist Stand  to Sit: 4: Min assist Stand Pivot Transfers: 4: Min assist Details for Transfer Assistance: vc for sequencing and safe use of RW; pt somewhat frustrated re: her inablity to figure out the sequence herself Ambulation/Gait Ambulation/Gait Assistance: 4: Min Environmental consultant (Feet): 12 Feet Assistive device: Rolling walker Ambulation/Gait Assistance Details: Pt required encouragement to attempt ambulation. Chair placed on opposite side of bed from [facility] and pt encouraged to walk around bed. She did well unweighting LLE with bil UEs on RW. Gait Pattern: Step-to pattern;Decreased step length - left Gait velocity: decreased    Exercises Amputee Exercises Quad Sets: AROM;Both;10 reps;Supine Hip Extension: Right;10 reps;Sidelying;AAROM (assist to stay fully on Lt side) Knee Extension: AROM;Right;5 reps;Seated   PT Diagnosis:    PT Problem List:   PT Treatment Interventions:     PT Goals (current goals can now be found in the care plan section) Acute Rehab PT Goals Patient Stated Goal: to get home  Visit Information  Last PT Received On: 07/01/13 Assistance Needed: +1 History of Present Illness: Patient is a 57 yo female admitted with ischemic Rt leg.  Patient s/p Rt BKA. PMHx DM, PVD, Rt fem-pop    Subjective Data  Patient Stated Goal: to get home   Cognition  Cognition Arousal/Alertness: Awake/alert Behavior During Therapy: WFL for tasks assessed/performed Overall Cognitive Status: Within Functional Limits for tasks assessed    Balance  Static Standing Balance Static Standing - Balance Support: Left upper extremity supported;During functional activity Static Standing - Level of Assistance: 4: Min assist Static Standing - Comment/# of Minutes: pt wanted to perform own pericare in standing after using BSC; instructed to  hold middle crossbar in center of RW if using just one hand on RW; pt attempted, however did not like the way this felt and returned to holding Lt handle  while PT stabilized RW  End of Session PT - End of Session Equipment Utilized During Treatment: Gait belt Activity Tolerance: Patient tolerated treatment well Patient left: in chair;with call bell/phone within reach;with nursing/sitter in room Nurse Communication: Mobility status;Other (comment) (? need for chair alarm and NT denied "she always calls")   GP     Schyler Butikofer 07/01/2013, 12:28 PM Pager [phone]

## 2013-07-01 NOTE — Progress Notes (Signed)
[facility] PROGRESS NOTE Subjective Pt w/o complaints good BM.  Objective: Vital signs in last 24 hours: Temp:  [98.4 F (36.9 C)-99.1 F (37.3 C)] 98.4 F (36.9 C) (09/09 0542) Pulse Rate:  [75-90] 83 (09/09 0542) Resp:  [18-20] 18 (09/09 0542) BP: (130-154)/(78-86) 130/78 mmHg (09/09 0542) SpO2:  [98 %] 98 % (09/09 0542) Weight:  [72 kg (158 lb 11.7 oz)] 72 kg (158 lb 11.7 oz) (09/09 0542) Last BM Date: 06/26/13  Intake/Output from previous day: 09/08 0701 - 09/09 0700 In: 600 [IV Piggyback:600] Out: -  Intake/Output this shift: Total I/O In: 120 [P.O.:120] Out: -     Lab Results:  Recent Labs  06/29/13 0530 06/30/13 0445 07/01/13 0520  WBC 11.1* 11.5* 11.5*  HGB 11.3* 10.9* 11.3*  HCT 33.1* 33.4* 34.2*  PLT 495* 514* 571*   BMET  Recent Labs  06/29/13 0530 06/30/13 0445 07/01/13 0520  NA 139 138 141  K 4.0 3.5 3.5  CL 103 102 105  CO2 26 24 26   CREATININE 1.06 1.06 1.11*   LFT No results found for this basename: PROT, AST, ALT, ALKPHOS, BILITOT, BILIDIR, IBILI,  in the last 72 hours PT/INR  Recent Labs  06/29/13 0530 06/30/13 0445 07/01/13 0738  LABPROT 24.8* 21.1* 22.6*  INR 2.33* 1.89* 2.06*   PANCREAS No results found for this basename: LIPASE,  in the last 72 hours       Studies/Results: No results found.  Medications: I have reviewed the patient's current medications.  Assessment/Plan: 1. GI Bleed. EGD negative, stool yesterday was negative for FOB, Hg stable. Would defer colonoscopy at this point and go ahead and resume whatever anticoagulation needed. If there is recurrent bleeding, would reconsider colonoscopy at that time. Please call Discussed with pt.   Taiki Buckwalter JR,Nesanel Aguila L 07/01/2013, 11:32 AM

## 2013-07-01 NOTE — Discharge Summary (Signed)
Physician Discharge Summary  [name] [name] EAV:[id number] DOB: 1956/06/14 DOA: 06/21/2013  PCP: No primary provider on file.  Admit date: 06/21/2013 Discharge date: 07/01/2013  Recommendations for Outpatient Follow-up:  1. Pt will need to follow up with PCP in 2-3 weeks post discharge 2. Please obtain BMP to evaluate electrolytes and kidney function 3. Please also check CBC to evaluate Hg and Hct levels 4. Please note that coumadin will be restarted and pt will need to hve PT/INR monitored closely 5. Please also note that if any bleed noted check Hg and decide if coumadin is to be continued, may need GI consultation if GI bleed recurs and ? Colonoscopy   Discharge Diagnoses:  Principal Problem:   Ischemic leg Active Problems:   GI bleed   Acute blood loss anemia   PVD (peripheral vascular disease)   HTN (hypertension)   Diabetes   Ileus  Discharge Condition: Stable  Diet recommendation: Heart healthy diet discussed in details   Brief narrative:  57 y.o. female with severe peripheral vascular disease status post femoropopliteal bypass last year, with a recent history of acute ischemic leg with thrombectomy just 2 days ago at [name], discharged on Coumadin and aspirin who presented with worsening pain in the right foot and black stools for 1-2 days. Apparently, since discharge from [name], patient continued to have pain in the right foot area and as a result she presented to [name] emergency room where she was found to have cold and pulseless right foot. She was found to have a hemoglobin of 7.6, which was a significant drop from just a several days prior to this admission. [facility] hospital[facility] was contacted by [name], both vascular surgery and gastroenterology was also consulted, patient was then transferred to the step down unit for further evaluation and management.   On reviewing the records from recent hospitalization at [name], and also  reviewing the operative note from vascular surgery, it looks like the patient's femoropopliteal graft is occluded, and they have attempted to remove clots 2 days prior to this admission with limited success.   Assessment/Plan:  Ischemic leg - Right /known severe PVD  - known hx of PVD s/p femoropopliteal bypass and recent thrombectomy for the occluded graft on 8/28 at [name].  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, post Op day #7 - due to concurrent UGI bleed with acute blood loss anemia, she was not started on IV Heparin after weighing risk vs benefits  - Coumadin was attempted but she has had drop in Hg 24 hours after Coumadin initiation, coumadin was held but now restarted as Hg stable and no signs of bleed Upper GI bleed  - Patient on chronic Coumadin therapy for severe peripheral vascular disease  - EGD on 8/30 - no foci to explain melanotic stools  - c/w PPI  - resume Coumadin and monitor PT/INR to adjust the dose as indicated  - if Hg drops, may need colonoscopy per Gi recommendations  Fever  - onset 9/2-9/3, afebrile over 48 hours  - blood cx's negative to date  - CXR 9/4 suggestive of developing infiltrate  - pt has received Vanc and Zosyn for 5 days, has completed 2 days of Levaquin, non need for ABX upon discharge  Ileus 2/2 right colonic constipation  - resolved with multiple stools after 1 bottle mg citrate  - will continue  to advance diet as pt able to tolerate  Acute blood loss anemia  - status post 2 U PRBC transfusion 06/28/2013  - will restart Coumadin and will need to monitor PT/INR closely, monitor Hg periodically HTN / hypotension  - reasonable inpatient control  Diabetes  -no change in tx plan today  Mild hypokalemia  - k is within normal limits this AM  Acute renal failure  - secondary to pre renal etiology  - creatinine is stable ~ 1.1   Code Status: FULL   Family Communication: no family present at time of exam  Disposition Plan: Ready for discharge to IR   Consultants:  Vasc Surgery  GI - [facility]  Procedures:  EGD - 8/30  R BKA - 8/31  CXR 06/26/2013 Early left basilar infiltrate.  Antibiotics:  Vancomycin and Zosyn 9/3 --> 9/8  Levaquin 9/8 --> 9/9  DVT prophylaxis:  SCD on left lower extremity   Discharge Exam: Filed Vitals:   07/01/13 0542  BP: 130/78  Pulse: 83  Temp: 98.4 F (36.9 C)  Resp: 18   Filed Vitals:   06/30/13 0455 06/30/13 1354 06/30/13 2209 07/01/13 0542  BP: 156/83 154/86 152/83 130/78  Pulse: 88 90 75 83  Temp: 99.7 F (37.6 C) 99.1 F (37.3 C) 98.8 F (37.1 C) 98.4 F (36.9 C)  TempSrc:  Oral Oral Oral  Resp: 19 20 18 18   Height:      Weight:    72 kg (158 lb 11.7 oz)  SpO2: 95% 98% 98% 98%    General: Pt is alert, follows commands appropriately, not in acute distress Cardiovascular: Regular rate and rhythm, S1/S2 +, no murmurs, no rubs, no gallops Respiratory: Clear to auscultation bilaterally, no wheezing, no crackles, no rhonchi Abdominal: Soft, non tender, non distended, bowel sounds +, no guarding Extremities: Right BKA, no cyanosis, pulses palpable bilaterally DP and PT Neuro: Grossly nonfocal  Discharge Instructions  Discharge Orders   Future Orders Complete By Expires   Diet - low sodium heart healthy  As directed    Increase activity slowly  As directed        Medication List         allopurinol 100 MG tablet  Commonly known as:  ZYLOPRIM  Take 100 mg by mouth daily.     amitriptyline 50 MG tablet  Commonly known as:  ELAVIL  Take 50 mg by mouth at bedtime.     DULoxetine 60 MG capsule  Commonly known as:  CYMBALTA  Take 60 mg by mouth daily.     EDARBYCLOR 40-12.5 MG Tabs  Generic drug:  Azilsartan-Chlorthalidone  Take 1 tablet by mouth daily.     FUSION PLUS PO  Take 1 capsule by mouth every morning.     nicotine polacrilex 2 MG gum  Commonly known as:   NICORETTE  Take 1 each (2 mg total) by mouth as needed for smoking cessation.     oxyCODONE-acetaminophen 5-325 MG per tablet  Commonly known as:  PERCOCET/ROXICET  Take 1 tablet by mouth every 4 (four) hours as needed for pain.     pantoprazole 40 MG tablet  Commonly known as:  PROTONIX  Take 1 tablet (40 mg total) by mouth 2 (two) times daily.     pregabalin 300 MG capsule  Commonly known as:  LYRICA  Take 300 mg by mouth 2 (two) times daily.     rosuvastatin 20 MG tablet  Commonly known as:  CRESTOR  Take 20 mg by  mouth daily.     saxagliptin HCl 2.5 MG Tabs tablet  Commonly known as:  ONGLYZA  Take 2.5 mg by mouth daily.     warfarin 4 MG tablet  Commonly known as:  COUMADIN  Take 4 mg by mouth daily.           Follow-up Information   Follow up with Debbora Presto, MD. (call my cell phon eif needed [phone])    Specialty:  Internal Medicine   Contact information:   [address] [place] [address] [phone]        The results of significant diagnostics from this hospitalization (including imaging, microbiology, ancillary and laboratory) are listed below for reference.     Microbiology: Recent Results (from the past 240 hour(s))  CULTURE, BLOOD (ROUTINE X 2)     Status: None   Collection Time    06/24/13 11:00 PM      Result Value Range Status   Specimen Description BLOOD RIGHT HAND   Final   Special Requests BOTTLES DRAWN AEROBIC ONLY [facility]   Final   Culture  Setup Time     Final   Value: 06/25/2013 05:21     Performed at [facility]   Culture     Final   Value: NO GROWTH 5 DAYS     Performed at [facility]   Report Status 07/01/2013 FINAL   Final  CULTURE, BLOOD (ROUTINE X 2)     Status: None   Collection Time    06/24/13 11:10 PM      Result Value Range Status   Specimen Description BLOOD LEFT HAND   Final   Special Requests BOTTLES DRAWN AEROBIC ONLY 5CC   Final   Culture  Setup Time     Final   Value:  06/25/2013 05:21     Performed at [facility]   Culture     Final   Value: NO GROWTH 5 DAYS     Performed at [facility]   Report Status 07/01/2013 FINAL   Final  URINE CULTURE     Status: None   Collection Time    06/25/13  5:30 PM      Result Value Range Status   Specimen Description URINE, CATHETERIZED   Final   Special Requests NONE   Final   Culture  Setup Time     Final   Value: 06/26/2013 02:36     Performed at [facility] Count     Final   Value: NO GROWTH     Performed at [facility]   Culture     Final   Value: NO GROWTH     Performed at [facility]   Report Status 06/27/2013 FINAL   Final     Labs: Basic Metabolic Panel:  Recent Labs Lab 06/24/13 2134  06/27/13 0520 06/28/13 0605 06/29/13 0530 06/30/13 0445 07/01/13 0520  NA 139  < > 139 138 139 138 141  K 4.8  < > 4.2 4.0 4.0 3.5 3.5  CL 104  < > 105 104 103 102 105  CO2 26  < > 26 26 26 24 26   GLUCOSE 106*  < > 135* 110* 106* 91 86  BUN 13  < > 14 12 12 11 13   CREATININE 1.17*  < > 1.12* 1.11* 1.06 1.06 1.11*  CALCIUM 8.3*  < > 8.4 8.5 9.1 9.3 9.4  MG 2.4  --   --   --   --   --   --   < > =  values in this interval not displayed.  CBC:  Recent Labs Lab 06/24/13 2134  06/27/13 0520 06/28/13 0605 06/29/13 0530 06/30/13 0445 07/01/13 0520  WBC 11.3*  < > 10.9* 9.0 11.1* 11.5* 11.5*  NEUTROABS 7.9*  --   --   --   --   --   --   HGB 8.3*  < > 7.7* 7.0* 11.3* 10.9* 11.3*  HCT 25.9*  < > 23.6* 21.2* 33.1* 33.4* 34.2*  MCV 86.9  < > 85.2 85.5 84.7 85.2 85.5  PLT 237  < > 379 414* 495* 514* 571*  < > = values in this interval not displayed.  CBG:  Recent Labs Lab 06/30/13 0801 06/30/13 1214 06/30/13 1712 06/30/13 2208 07/01/13 0822  GLUCAP 97 92 90 79 90     SIGNED: Time coordinating discharge: Over 30 minutes  MAGICK-Azir Muzyka, MD  [facility] 07/01/2013, 12:04 PM Pager [phone]  If 7PM-7AM, please contact  night-coverage [email or web address] Password [email or web address]

## 2013-07-01 NOTE — Progress Notes (Signed)
Transported to rehab via wheelchair

## 2013-07-01 NOTE — H&P (Signed)
Physical Medicine and Rehabilitation Admission H&P  CC: R-BKA for ischemic RLE  HPI: [name] [name] is a 57 y.o. female with history of severe PVD with FPBG and RLL thrombectomy at [facility] few days PTA on 06/21/13 with ischemic RLE with foot pain and numbness as well as black stools. She underwent EGD by Dr. Madilyn Fireman and this was negative for source of bleeding with recommendations to monitor H/H and colonoscopy is indicated. She was evaluated by Dr. 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 Post op with fever and lethargy and was started on IV Vancomycin and Zosyn empirically for early left basilar infiltrate. She continued to have melenotic stools and was transfused on 08/06 for drop in H/H to 7.0. Dr. Madilyn Fireman recommends colonoscopy once recovered from surgery sooner if bleeding recurs. Stool guaiacs recommended for monitoring. Coumadin resumed and is therapeutic. H/H has improved to 11.3. Therapies ongoing and patient continues to be limited by pain as well as high levels of anxiety.    Review of Systems  HENT: Negative for hearing loss.  Eyes: Positive for blurred vision (needs glasses). Negative for double vision.  Respiratory: Negative for cough, shortness of breath and wheezing.  Cardiovascular: Negative for chest pain and palpitations.  Gastrointestinal: Negative for heartburn, nausea, vomiting, abdominal pain and constipation.  Genitourinary: Negative for urgency.  Musculoskeletal: Positive for joint pain. Negative for myalgias.  Neurological: Positive for sensory change (H/O Numbness BLE. ) and weakness. Negative for headaches.  Phantom pain RLE.  Psychiatric/Behavioral: The patient has insomnia (takes elavil ).   Past Medical History   Diagnosis  Date   .  PVD (peripheral vascular disease) with claudication    .  HTN (hypertension)    .  Diabetes mellitus    .  Peripheral neuropathy     Past Surgical History   Procedure  Laterality  Date   .  Esophagogastroduodenoscopy  N/A   06/21/2013     Procedure: ESOPHAGOGASTRODUODENOSCOPY (EGD); Surgeon: Barrie Folk, MD; Location: [facility] ENDOSCOPY; Service: Endoscopy; Laterality: N/A;   .  Amputation  Right  06/22/2013     Procedure: AMPUTATION BELOW KNEE ; Surgeon: Chuck Hint, MD; Location: [facility] OR; Service: Vascular; Laterality: Right;   .  Femoral-popliteal bypass graft  Right  2013   .  Thrombectomy       of right FPBG    Family History   Problem  Relation  Age of Onset   .  Diabetes  Sister    .  Heart disease  Mother    .  Heart disease  Father     Social History: Lives alone-. Her son was helping with home management PTA (incarcerated)--sister and neighbor to assist past discharge. She reports that she has been smoking--1 1/2 PPD. She does not have any smokeless tobacco history on file. She drinks 1-2 beers daily. Her drug history is not on file.  Allergies: No Known Allergies  Medications Prior to Admission   Medication  Sig  Dispense  Refill   .  allopurinol (ZYLOPRIM) 100 MG tablet  Take 100 mg by mouth daily.     Marland Kitchen  amitriptyline (ELAVIL) 50 MG tablet  Take 50 mg by mouth at bedtime.     .  Azilsartan-Chlorthalidone (EDARBYCLOR) 40-12.5 MG TABS  Take 1 tablet by mouth daily.     .  DULoxetine (CYMBALTA) 60 MG capsule  Take 60 mg by mouth daily.     .  Iron-FA-B Cmp-C-Biot-Probiotic (FUSION PLUS PO)  Take 1 capsule by  mouth every morning.     .  pregabalin (LYRICA) 300 MG capsule  Take 300 mg by mouth 2 (two) times daily.     .  rosuvastatin (CRESTOR) 20 MG tablet  Take 20 mg by mouth daily.     .  saxagliptin HCl (ONGLYZA) 2.5 MG TABS tablet  Take 2.5 mg by mouth daily.     Marland Kitchen  warfarin (COUMADIN) 4 MG tablet  Take 4 mg by mouth daily.     .  [DISCONTINUED] oxyCODONE-acetaminophen (PERCOCET/ROXICET) 5-325 MG per tablet  Take 1 tablet by mouth every 4 (four) hours as needed for pain.      Home:  Home Living  Family/patient expects to be discharged to:: Private residence  Living Arrangements: Other  relatives (Grandson - 32 yo)  Available Help at Discharge: Available PRN/intermittently  Type of Home: House  Home Access: Stairs to enter  [employer] of Steps: 3  Entrance Stairs-Rails: Right;Left  Home Layout: One level  Home Equipment: Bedside commode;Shower seat  Lives With: Family;Other (Comment) (adult son and 69 yo grandson)  Functional History:  Prior Function  Comments: Does not drive (fell 3 times PTA)  Functional Status:  Mobility:  Bed Mobility  Bed Mobility: Supine to Sit;Sitting - Scoot to [place] of Bed;Rolling Left  Rolling Left: 4: Min assist  Supine to Sit: 5: Supervision;HOB elevated  Supine to Sit: Patient Percentage: 50%  Sitting - Scoot to Edge of Bed: 5: Supervision;With rail  Sit to Supine: 5: Supervision;With rail;HOB flat  Scooting to HOB: With rail;4: Min guard  Transfers  Transfers: Sit to Stand;Stand to [employer] Transfers  Sit to Stand: 4: Min assist;With upper extremity assist  Stand to Sit: 4: Min assist  Stand Pivot Transfers: 4: Min Clinical cytogeneticist Transfers: 1: +2 Total assist  Squat Pivot Transfers: Patient Percentage: 60%  Ambulation/Gait  Ambulation/Gait Assistance: 4: Min assist  Ambulation/Gait: Patient Percentage: 80%  Ambulation Distance (Feet): 12 Feet  Assistive device: Rolling walker  Ambulation/Gait Assistance Details: Pt required encouragement to attempt ambulation. Chair placed on opposite side of bed from [facility] and pt encouraged to walk around bed. She did well unweighting LLE with bil UEs on RW.  Gait Pattern: Step-to pattern;Decreased step length - left  Gait velocity: decreased  Stairs: No  Wheelchair Mobility  Wheelchair Mobility: No  ADL:  ADL  Grooming: Performed;Set up;Wash/dry face;Wash/dry hands  Where Assessed - Grooming: Unsupported sitting  Upper Body Bathing: Set up;Performed  Where Assessed - Upper Body Bathing: Unsupported sitting  Lower Body Bathing: Minimal assistance;Simulated  Where  Assessed - Lower Body Bathing: Supported sit to stand  Upper Body Dressing: Performed;Minimal assistance;Other (comment) (min A due to IV lines R UE)  Where Assessed - Upper Body Dressing: Unsupported sitting  Lower Body Dressing: Performed;Minimal assistance;Moderate assistance  Where Assessed - Lower Body Dressing: Supported sit to stand  Toilet Transfer: Performed;Min guard;Minimal Psychologist, clinical Method: Sit to stand;Stand pivot  Acupuncturist: Chief Financial Officer Method: Not assessed  Equipment Used: Gait belt;Other (comment) (BSC)  Transfers/Ambulation Related to ADLs: pt wanted no assist initially or to use RW for sit - stand, however pt anxious and gettng tangled in IV lines  ADL Comments: Pt very anxious about doing ADLs for herself  Cognition:  Cognition  Overall Cognitive Status: Within Functional Limits for tasks assessed  Orientation Level: Oriented X4  Cognition  Arousal/Alertness: Awake/alert  Behavior During Therapy: WFL for tasks assessed/performed  Overall Cognitive Status:  Within Functional Limits for tasks assessed  Physical Exam:  Blood pressure 130/78, pulse 83, temperature 98.4 F (36.9 C), temperature source Oral, resp. rate 18, height 5\' 6"  (1.676 m), weight 72 kg (158 lb 11.7 oz), SpO2 98.00%.  Physical Exam  Nursing note and vitals reviewed.  Constitutional: She is oriented to person, place, and time. She appears well-developed and well-nourished.  HENT:  Head: Normocephalic and atraumatic.  Poor dentition.  Eyes: Conjunctivae are normal. Pupils are equal, round, and reactive to light.  Neck: Normal range of motion.  Cardiovascular: Normal rate and regular rhythm.  Pulmonary/Chest: Effort normal and breath sounds normal. No respiratory distress. She has no wheezes.  Abdominal: Soft. Bowel sounds are normal. She exhibits no distension. There is no tenderness.  Musculoskeletal:  LLE without edema or breakdown. R-BKA  edematous and tight with some blistering along medial aspect of amputation site. Small amount of old dark bloody drainage noted from incision. Has a difficult time fully extending knee. Neurological: She is alert and oriented to person, place, and time. Intact insight and awareness. Motor strength is 5/5 bilateral deltoid, bicep, tricep, grip Right hip flexor 2 /5---Left hip flexor 5/5 left knee extensor ankle dorsiflexor plantar flexor 5/5 Sensory exam is normal. Good sitting balance Skin: Skin is warm and dry.   Results for orders placed during the hospital encounter of 06/21/13 (from the past 48 hour(s))   GLUCOSE, CAPILLARY Status: Abnormal    Collection Time    06/29/13 4:59 PM   Result  Value  Range    Glucose-Capillary  120 (*)  70 - 99 mg/dL    Comment 1  Notify RN     Comment 2  Documented in Chart    GLUCOSE, CAPILLARY Status: Abnormal    Collection Time    06/29/13 9:52 PM   Result  Value  Range    Glucose-Capillary  112 (*)  70 - 99 mg/dL    Comment 1  Notify RN     Comment 2  Documented in Chart    PROTIME-INR Status: Abnormal    Collection Time    06/30/13 4:45 AM   Result  Value  Range    Prothrombin Time  21.1 (*)  11.6 - 15.2 seconds    INR  1.89 (*)  0.00 - 1.49   CBC Status: Abnormal    Collection Time    06/30/13 4:45 AM   Result  Value  Range    WBC  11.5 (*)  4.0 - 10.5 K/uL    RBC  3.92  3.87 - 5.11 MIL/uL    Hemoglobin  10.9 (*)  12.0 - 15.0 g/dL    HCT  16.1 (*)  09.6 - 46.0 %    MCV  85.2  78.0 - 100.0 fL    MCH  27.8  26.0 - 34.0 pg    MCHC  32.6  30.0 - 36.0 g/dL    RDW  04.5 (*)  40.9 - 15.5 %    Platelets  514 (*)  150 - 400 K/uL   BASIC METABOLIC PANEL Status: Abnormal    Collection Time    06/30/13 4:45 AM   Result  Value  Range    Sodium  138  135 - 145 mEq/L    Potassium  3.5  3.5 - 5.1 mEq/L    Chloride  102  96 - 112 mEq/L    CO2  24  19 - 32 mEq/L    Glucose, Bld  91  70 - 99 mg/dL    BUN  11  6 - 23 mg/dL    Creatinine, Ser   1.61  0.50 - 1.10 mg/dL    Calcium  9.3  8.4 - 10.5 mg/dL    GFR calc non Af Amer  57 (*)  >90 mL/min    GFR calc Af Amer  66 (*)  >90 mL/min    Comment:  (NOTE)     The eGFR has been calculated using the CKD EPI equation.     This calculation has not been validated in all clinical situations.     eGFR's persistently <90 mL/min signify possible Chronic Kidney     Disease.   GLUCOSE, CAPILLARY Status: None    Collection Time    06/30/13 8:01 AM   Result  Value  Range    Glucose-Capillary  97  70 - 99 mg/dL   GLUCOSE, CAPILLARY Status: None    Collection Time    06/30/13 12:14 PM   Result  Value  Range    Glucose-Capillary  92  70 - 99 mg/dL   GLUCOSE, CAPILLARY Status: None    Collection Time    06/30/13 5:12 PM   Result  Value  Range    Glucose-Capillary  90  70 - 99 mg/dL   OCCULT BLOOD X 1 CARD TO LAB, STOOL Status: None    Collection Time    06/30/13 5:36 PM   Result  Value  Range    Fecal Occult Bld  NEGATIVE  NEGATIVE   GLUCOSE, CAPILLARY Status: None    Collection Time    06/30/13 10:08 PM   Result  Value  Range    Glucose-Capillary  79  70 - 99 mg/dL   CBC Status: Abnormal    Collection Time    07/01/13 5:20 AM   Result  Value  Range    WBC  11.5 (*)  4.0 - 10.5 K/uL    RBC  4.00  3.87 - 5.11 MIL/uL    Hemoglobin  11.3 (*)  12.0 - 15.0 g/dL    HCT  09.6 (*)  04.5 - 46.0 %    MCV  85.5  78.0 - 100.0 fL    MCH  28.3  26.0 - 34.0 pg    MCHC  33.0  30.0 - 36.0 g/dL    RDW  40.9 (*)  81.1 - 15.5 %    Platelets  571 (*)  150 - 400 K/uL   BASIC METABOLIC PANEL Status: Abnormal    Collection Time    07/01/13 5:20 AM   Result  Value  Range    Sodium  141  135 - 145 mEq/L    Potassium  3.5  3.5 - 5.1 mEq/L    Chloride  105  96 - 112 mEq/L    CO2  26  19 - 32 mEq/L    Glucose, Bld  86  70 - 99 mg/dL    BUN  13  6 - 23 mg/dL    Creatinine, Ser  9.14 (*)  0.50 - 1.10 mg/dL    Calcium  9.4  8.4 - 10.5 mg/dL    GFR calc non Af Amer  54 (*)  >90 mL/min    GFR calc  Af Amer  62 (*)  >90 mL/min    Comment:  (NOTE)     The eGFR has been calculated using the CKD EPI equation.     This calculation has not been validated in all  clinical situations.     eGFR's persistently <90 mL/min signify possible Chronic Kidney     Disease.   PROTIME-INR Status: Abnormal    Collection Time    07/01/13 7:38 AM   Result  Value  Range    Prothrombin Time  22.6 (*)  11.6 - 15.2 seconds    INR  2.06 (*)  0.00 - 1.49   GLUCOSE, CAPILLARY Status: None    Collection Time    07/01/13 8:22 AM   Result  Value  Range    Glucose-Capillary  90  70 - 99 mg/dL    Comment 1  Notify RN    GLUCOSE, CAPILLARY Status: Abnormal    Collection Time    07/01/13 12:14 PM   Result  Value  Range    Glucose-Capillary  102 (*)  70 - 99 mg/dL    Comment 1  Notify RN     No results found.  Post Admission Physician Evaluation:  1. Functional deficits secondary to right BKA. 2. Patient is admitted to receive collaborative, interdisciplinary care between the physiatrist, rehab nursing staff, and therapy team. 3. Patient's level of medical complexity and substantial therapy needs in context of that medical necessity cannot be provided at a lesser intensity of care such as a SNF. 4. Patient has experienced substantial functional loss from his/her baseline which was documented above under the "Functional History" and "Functional Status" headings. Judging by the patient's diagnosis, physical exam, and functional history, the patient has potential for functional progress which will result in measurable gains while on inpatient rehab. These gains will be of substantial and practical use upon discharge in facilitating mobility and self-care at the household level. 5. Physiatrist will provide 24 hour management of medical needs as well as oversight of the therapy plan/treatment and provide guidance as appropriate regarding the interaction of the two. 6. 24 hour rehab nursing will assist with bladder  management, bowel management, safety, skin/wound care, disease management, medication administration, pain management and patient education and help integrate therapy concepts, techniques,education, etc. 7. PT will assess and treat for/with: Lower extremity strength, range of motion, stamina, balance, functional mobility, safety, adaptive techniques and equipment, pre-pros education, pain mgt, safety. Goals are: mod I. 8. OT will assess and treat for/with: ADL's, functional mobility, safety, upper extremity strength, adaptive techniques and equipment, pain mgt, pre-pros education. Goals are: mod I. 9. SLP will assess and treat for/with: n/a. Goals are: n/a. 10. Case Management and Social Worker will assess and treat for psychological issues and discharge planning. 11. Team conference will be held weekly to assess progress toward goals and to determine barriers to discharge. 12. Patient will receive at least 3 hours of therapy per day at least 5 days per week. 13. ELOS: 7-10 days  14. Prognosis: excellent   Medical Problem List and Plan:  1. PVD/ RLE thrombectomy: Pharmaceutical: Coumadin  2. Pain Management: will likely need adjustment in medication for better management. Will start OxyContin for more consistent pain management which should help with tolerance of activity. '  -lyrica for phantom limb pain as well 3. Mood: Motivated to get better and home. Will have LCSW follow for evaluation.  4. Neuropsych: This patient is capable of making decisions on her own behalf.  5. DM type 2: will monitor with ac/hs cbg checks. Resume sitagliptin and use SSI for elevated BS.  6. LLL infiltrate with persistent leucocytosis: Vanc/Zosyn 9/3-9/8--changed to Levaquin on 09/08 with recommendations for 7 total days of antibiotic therapy.  7. Peripheral neuropathy: Will continue Lyrica, Cymbalta and elavil. Monitor for recurrent sedation.  8. Dyslipidemia:Will continue lipitor.  9. Ongoing tobacco abuse: plans  on quitting. Does not want to try nicotine patch at this time.  10. ABLA: Will continue to monitor H/H with serial checks. Check stool guaiacs.  11. HTN: Will monitor with bid checks. Off medications at this time.  12. Constipation: Will schedule Senna bid.  Ranelle Oyster, MD, [facility] Physical Medicine & Rehabilitation   07/01/2013

## 2013-07-02 ENCOUNTER — Inpatient Hospital Stay (HOSPITAL_COMMUNITY): Payer: PRIVATE HEALTH INSURANCE

## 2013-07-02 ENCOUNTER — Inpatient Hospital Stay (HOSPITAL_COMMUNITY): Payer: PRIVATE HEALTH INSURANCE | Admitting: Physical Therapy

## 2013-07-02 ENCOUNTER — Inpatient Hospital Stay (HOSPITAL_COMMUNITY): Payer: PRIVATE HEALTH INSURANCE | Admitting: Occupational Therapy

## 2013-07-02 DIAGNOSIS — L98499 Non-pressure chronic ulcer of skin of other sites with unspecified severity: Secondary | ICD-10-CM

## 2013-07-02 DIAGNOSIS — S88119A Complete traumatic amputation at level between knee and ankle, unspecified lower leg, initial encounter: Secondary | ICD-10-CM

## 2013-07-02 DIAGNOSIS — I739 Peripheral vascular disease, unspecified: Secondary | ICD-10-CM

## 2013-07-02 LAB — CBC WITH DIFFERENTIAL/PLATELET
Basophils Relative: 0 % (ref 0–1)
Eosinophils Absolute: 0.2 10*3/uL (ref 0.0–0.7)
Eosinophils Relative: 2 % (ref 0–5)
HCT: 31.9 % — ABNORMAL LOW (ref 36.0–46.0)
Hemoglobin: 10.8 g/dL — ABNORMAL LOW (ref 12.0–15.0)
Lymphs Abs: 2.1 10*3/uL (ref 0.7–4.0)
MCH: 28.8 pg (ref 26.0–34.0)
MCHC: 33.9 g/dL (ref 30.0–36.0)
MCV: 85.1 fL (ref 78.0–100.0)
Monocytes Absolute: 0.9 10*3/uL (ref 0.1–1.0)
Monocytes Relative: 8 % (ref 3–12)
RBC: 3.75 MIL/uL — ABNORMAL LOW (ref 3.87–5.11)
WBC: 11.3 10*3/uL — ABNORMAL HIGH (ref 4.0–10.5)

## 2013-07-02 LAB — COMPREHENSIVE METABOLIC PANEL
Alkaline Phosphatase: 90 U/L (ref 39–117)
BUN: 13 mg/dL (ref 6–23)
Calcium: 9.3 mg/dL (ref 8.4–10.5)
Chloride: 102 mEq/L (ref 96–112)
Creatinine, Ser: 1.14 mg/dL — ABNORMAL HIGH (ref 0.50–1.10)
GFR calc Af Amer: 60 mL/min — ABNORMAL LOW (ref >90)
Glucose, Bld: 85 mg/dL (ref 70–99)
Total Protein: 6.3 g/dL (ref 6.0–8.3)

## 2013-07-02 LAB — GLUCOSE, CAPILLARY
Glucose-Capillary: 107 mg/dL — ABNORMAL HIGH (ref 70–99)
Glucose-Capillary: 131 mg/dL — ABNORMAL HIGH (ref 70–99)

## 2013-07-02 LAB — PROTIME-INR
INR: 1.76 — ABNORMAL HIGH (ref 0.00–1.49)
Prothrombin Time: 20 seconds — ABNORMAL HIGH (ref 11.6–15.2)

## 2013-07-02 MED ORDER — WARFARIN SODIUM 5 MG PO TABS
5.0000 mg | ORAL_TABLET | Freq: Once | ORAL | Status: AC
  Administered 2013-07-02: 5 mg via ORAL
  Filled 2013-07-02: qty 1

## 2013-07-02 MED ORDER — OXYCODONE HCL 5 MG PO TABS
10.0000 mg | ORAL_TABLET | ORAL | Status: DC | PRN
  Administered 2013-07-02 – 2013-07-03 (×6): 15 mg via ORAL
  Administered 2013-07-04: 10 mg via ORAL
  Administered 2013-07-04: 15 mg via ORAL
  Administered 2013-07-04: 10 mg via ORAL
  Administered 2013-07-04 – 2013-07-05 (×3): 15 mg via ORAL
  Administered 2013-07-05: 10 mg via ORAL
  Administered 2013-07-06 – 2013-07-08 (×8): 15 mg via ORAL
  Administered 2013-07-08: 10 mg via ORAL
  Administered 2013-07-08 – 2013-07-11 (×12): 15 mg via ORAL
  Filled 2013-07-02 (×16): qty 3
  Filled 2013-07-02: qty 2
  Filled 2013-07-02 (×7): qty 3
  Filled 2013-07-02: qty 2
  Filled 2013-07-02 (×2): qty 3
  Filled 2013-07-02: qty 2
  Filled 2013-07-02 (×6): qty 3

## 2013-07-02 NOTE — Progress Notes (Signed)
Physical Therapy Note  Patient Details  Name: [name] [name] MRN: [id number] Date of Birth: 06-03-1956 Today's Date: 07/02/2013  1545- 1630 (45 minutes) individual Pain: 9/10 RT BKA/ nurse notified / meds given Focus of treatment: Standing tolerance with RW; RT BKA AROM/ strengthening exercises in supine Treatment: Supine to sit SBA; sit to stand to RW min assist for safety; standing - pt unable to stand with RW ( X 2 attempts) secondary to increased pain RT BKA; transfers squat/pivot SBA; RT BKA exercises in supine- hip flexion/extension , hip abduction, quad sets (bilaterally) ., ankle pumps (left LE) X 20 ; wc mobility SBA on unit 150 feet level surfaces.    Fia Hebert,JIM 07/02/2013, 4:15 PM

## 2013-07-02 NOTE — Progress Notes (Signed)
Occupational Therapy Assessment and Plan  Patient Details  Name: [name] [name] MRN: [id number] Date of Birth: [birth year]  OT Diagnosis: pain in joint and swelling of limb Rehab Potential: Rehab Potential: Good ELOS: 7-10 days   Today's Date: 07/02/2013 Time: 1030-1130 Time Calculation (min): 60 min  Problem List:  Patient Active Problem List   Diagnosis Date Noted  . Ileus 06/24/2013  . Ischemic leg--R BKA 06/21/2013  . GI bleed 06/21/2013  . Acute blood loss anemia 06/21/2013  . PVD (peripheral vascular disease) 06/21/2013  . HTN (hypertension) 06/21/2013  . Diabetes 06/21/2013    Past Medical History:  Past Medical History  Diagnosis Date  . PVD (peripheral vascular disease) with claudication   . HTN (hypertension)   . Diabetes mellitus   . Peripheral neuropathy    Past Surgical History:  Past Surgical History  Procedure Laterality Date  . Esophagogastroduodenoscopy N/A 06/21/2013    Procedure: ESOPHAGOGASTRODUODENOSCOPY (EGD);  Surgeon: Barrie Folk, MD;  Location: [facility] ENDOSCOPY;  Service: Endoscopy;  Laterality: N/A;  . Amputation Right 06/22/2013    Procedure: AMPUTATION BELOW KNEE ;  Surgeon: Chuck Hint, MD;  Location: [facility] OR;  Service: Vascular;  Laterality: Right;  . Femoral-popliteal bypass graft Right 2013  . Thrombectomy      of right FPBG    Assessment & Plan Clinical Impression: Patient is a 57 y.o. year old female with history of severe PVD with FPBG and RLL thrombectomy at [facility] hospital few days PTA on 06/21/13 with ischemic RLE with foot pain and numbness as well as black stools. She underwent EGD by Dr. Madilyn Fireman and this was negative for source of bleeding with recommendations to monitor H/H and colonoscopy is indicated. She was evaluated by Dr. 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   Patient with fever and placed on Vanc and Zosyn for 5 days. Fever resolved and switched to oral antibiotic. Ileus resolved with Mg Citrated treatment.   Patient  transferred to CIR on 07/01/2013 .    Patient currently requires min assist with basic self-care skills secondary to pain at residual limb and muscle weakness.  Prior to hospitalization, patient could complete BADL/iADL with modified independence, using shower chair.  Patient will benefit from skilled intervention to increase independence with basic self-care skills prior to discharge home independently.  Anticipate patient will require distant supervision with no further OT follow up recommended.  OT - End of Session Activity Tolerance: Tolerates 30+ min activity with multiple rests Endurance Deficit: Yes Endurance Deficit Description: endurance and performance of ADL limited by pain intolerance OT Assessment Rehab Potential: Good OT Patient demonstrates impairments in the following area(s): Balance;Endurance;Pain;Safety OT Basic ADL's Functional Problem(s): Dressing;Bathing;Toileting OT Advanced ADL's Functional Problem(s): Laundry;Light Housekeeping;Simple Meal Preparation OT Transfers Functional Problem(s): Toilet;Tub/Shower OT Plan OT Intensity: Minimum of 1-2 x/day, 45 to 90 minutes OT Frequency: 5 out of 7 days OT Duration/Estimated Length of Stay: 7-10 days OT Treatment/Interventions: Functional mobility training;Self Care/advanced ADL retraining;Wheelchair propulsion/positioning;Therapeutic Exercise;Therapeutic Activities;Patient/family education;Pain management OT Self Feeding Anticipated Outcome(s): Mod I OT Basic Self-Care Anticipated Outcome(s): Mod I OT Toileting Anticipated Outcome(s): Mod I OT Bathroom Transfers Anticipated Outcome(s): Mod I OT Recommendation Patient destination: Home Follow Up Recommendations: None Equipment Recommended: Wheelchair (measurements);Rolling walker with 5" wheels Equipment Details:  (has shower chair and BSC)   Skilled Therapeutic Intervention 1:1 initial OT evaluation completed with treatment provided to emphasize w/c management, pain  management, effective use of DME for falls prevention, dynamic sitting/standing balance, and compensatory strategies for performance of  self-care.   Patient participated in bathing at sink, sitting and standing, with steadying assist while standing.  Endurance limited by intolerance to pain.  OT Evaluation Precautions/Restrictions  Precautions Precautions: Fall Restrictions Weight Bearing Restrictions: Yes RLE Weight Bearing: Non weight bearing  General Chart Reviewed: Yes Family/Caregiver Present: No  Pain Pain Assessment Pain Assessment: 0-10 Pain Score: 10-Worst pain ever Pain Type: Acute pain Pain Location: Leg Pain Orientation: Right Pain Descriptors / Indicators: Stabbing Pain Onset: On-going Patients Stated Pain Goal: 3 Pain Intervention(s): Medication (See eMAR);Repositioned;Distraction Multiple Pain Sites: No  Home Living/Prior Functioning Home Living Available Help at Discharge: Available PRN/intermittently Type of Home: Apartment Home Access: Stairs to enter Entrance Stairs-Number of Steps: 1 + 1 (onto porch and then into house) Entrance Stairs-Rails: Right;Left Home Layout: One level  Lives With: Family IADL History Homemaking Responsibilities: No Meal Prep Responsibility: Primary Laundry Responsibility: Primary Cleaning Responsibility: Primary Bill Paying/Finance Responsibility: Primary Shopping Responsibility: Primary Child Care Responsibility: No Current License: No Occupation: On disability Prior Function Level of Independence: Independent with homemaking with ambulation;Independent with basic ADLs;Independent with transfers  Able to Take Stairs?: Yes Driving: No Vocation: On disability Leisure: Hobbies-yes (Comment) (walking, playing basketball) Comments: Does not drive  Vision/Perception  Vision - History Baseline Vision: Wears glasses only for reading Patient Visual Report: Blurring of vision Vision - Assessment Eye Alignment: Within  Functional Limits Perception Perception: Within Functional Limits Praxis Praxis: Intact   Cognition Overall Cognitive Status: Within Functional Limits for tasks assessed Orientation Level: Oriented X4 Attention: Alternating Alternating Attention: Appears intact Memory: Appears intact Awareness: Appears intact Problem Solving: Appears intact Executive Function: Self Monitoring Safety/Judgment: Appears intact  Sensation Sensation Light Touch: Appears Intact (residual limb ACE wrapped; reports phantom pin) Proprioception: Appears Intact Coordination Gross Motor Movements are Fluid and Coordinated: Yes  Motor  Motor Motor: Within Functional Limits  Mobility  Bed Mobility Bed Mobility: Rolling Left Rolling Left: 5: Supervision Supine to Sit: With rails;5: Supervision Supine to Sit: Patient Percentage: 80% Supine to Sit Details: Verbal cues for technique Sitting - Scoot to Edge of Bed: 5: Supervision Sit to Supine: 5: Supervision Transfers Transfers: Sit to Stand;Stand to Sit Sit to Stand: 4: Min guard Stand to Sit: 5: Supervision   Trunk/Postural Assessment  Cervical Assessment Cervical Assessment: Within Functional Limits Thoracic Assessment Thoracic Assessment: Within Functional Limits Lumbar Assessment Lumbar Assessment: Within Functional Limits Postural Control Postural Control: Within Functional Limits   Balance Balance Balance Assessed: Yes Static Sitting Balance Static Sitting - Balance Support: No upper extremity supported Static Sitting - Level of Assistance: 6: Modified independent (Device/Increase time) Dynamic Sitting Balance Dynamic Sitting - Balance Support: No upper extremity supported;During functional activity;Feet unsupported Dynamic Sitting - Level of Assistance: 5: Stand by assistance Static Standing Balance Static Standing - Balance Support: Left upper extremity supported;During functional activity Static Standing - Level of Assistance:  4: Min assist Dynamic Standing Balance Dynamic Standing - Level of Assistance: 4: Min assist (steadying assist while standing at sink to bathe below waist)  Extremity/Trunk Assessment RUE Assessment RUE Assessment: Within Functional Limits LUE Assessment LUE Assessment: Within Functional Limits  FIM:  FIM - Grooming Grooming Steps: Wash, rinse, dry face;Wash, rinse, dry hands;Oral care, brush teeth, clean dentures Grooming: 5: Set-up assist to obtain items FIM - Bathing Bathing Steps Patient Completed: Chest;Right Arm;Left Arm;Abdomen;Front perineal area;Buttocks;Right upper leg;Left upper leg;Left lower leg (including foot) Bathing: 5: Set-up assist to: Obtain items FIM - Upper Body Dressing/Undressing Upper body dressing/undressing: 0: Wears Oceanographer  FIM - Lower Body Dressing/Undressing Lower body dressing/undressing: 0: Wears Oceanographer FIM - Banker Devices: Arm rests Bed/Chair Transfer: 5: Supine > Sit: Supervision (verbal cues/safety issues);4: Bed > Chair or W/C: Min A (steadying Pt. > 75%);4: Chair or W/C > Bed: Min A (steadying Pt. > 75%)   Refer to Care Plan for Long Term Goals  Recommendations for other services: None  Discharge Criteria: Patient will be discharged from OT if patient refuses treatment 3 consecutive times without medical reason, if treatment goals not met, if there is a change in medical status, if patient makes no progress towards goals or if patient is discharged from hospital.  The above assessment, treatment plan, treatment alternatives and goals were discussed and mutually agreed upon: by patient  Georgeanne Nim 07/02/2013, 12:32 PM

## 2013-07-02 NOTE — Progress Notes (Signed)
Subjective/Complaints: Was able to sleep. Still with stump and phantom limb pain.  A 12 point review of systems has been performed and if not noted above is otherwise negative.   Objective: Vital Signs: Blood pressure 136/86, pulse 80, temperature 99.2 F (37.3 C), temperature source Oral, resp. rate 18, height 5\' 6"  (1.676 m), weight 68.992 kg (152 lb 1.6 oz), SpO2 98.00%. No results found.  Recent Labs  07/01/13 0520 07/02/13 0535  WBC 11.5* 11.3*  HGB 11.3* 10.8*  HCT 34.2* 31.9*  PLT 571* 607*    Recent Labs  07/01/13 0520 07/02/13 0535  NA 141 137  K 3.5 3.7  CL 105 102  GLUCOSE 86 85  BUN 13 13  CREATININE 1.11* 1.14*  CALCIUM 9.4 9.3   CBG (last 3)   Recent Labs  07/01/13 1621 07/01/13 2013 07/02/13 0730  GLUCAP 110* 91 107*    Wt Readings from Last 3 Encounters:  07/02/13 68.992 kg (152 lb 1.6 oz)  07/01/13 72 kg (158 lb 11.7 oz)  07/01/13 72 kg (158 lb 11.7 oz)    Physical Exam:  Constitutional: She is oriented to person, place, and time. She appears well-developed and well-nourished.  HENT:  Head: Normocephalic and atraumatic.  Poor dentition.  Eyes: Conjunctivae are normal. Pupils are equal, round, and reactive to light.  Neck: Normal range of motion.  Cardiovascular: Normal rate and regular rhythm.  Pulmonary/Chest: Effort normal and breath sounds normal. No respiratory distress. She has no wheezes.  Abdominal: Soft. Bowel sounds are normal. She exhibits no distension. There is no tenderness.  Musculoskeletal: right stump tender, well shaped Neurological: She is alert and oriented to person, place, and time. Intact insight and awareness. Motor strength is 5/5 bilateral deltoid, bicep, tricep, grip Right hip flexor 2 /5---Left hip flexor 5/5 left knee extensor ankle dorsiflexor plantar flexor 5/5 Sensory exam is normal. Good sitting balance  Skin: Skin is warm and dry. Wound clean and intact with staples. Minimal to no  drainage   Assessment/Plan: 1. Functional deficits secondary to right BKA which require 3+ hours per day of interdisciplinary therapy in a comprehensive inpatient rehab setting. Physiatrist is providing close team supervision and 24 hour management of active medical problems listed below. Physiatrist and rehab team continue to assess barriers to discharge/monitor patient progress toward functional and medical goals. FIM:                   Comprehension Comprehension Mode: Auditory Comprehension: 6-Follows complex conversation/direction: With extra time/assistive device  Expression Expression Mode: Verbal Expression: 6-Expresses complex ideas: With extra time/assistive device  Social Interaction Social Interaction: 6-Interacts appropriately with others with medication or extra time (anti-anxiety, antidepressant).  Problem Solving Problem Solving: 6-Solves complex problems: With extra time  Memory Memory: 6-More than reasonable amt of time Medical Problem List and Plan:  1. PVD/ RLE thrombectomy: Pharmaceutical: Coumadin  2. Pain Management: will likely need adjustment in medication for better management. Will start OxyContin for more consistent pain management which should help with tolerance of activity. '  -lyrica for phantom limb pain as well  -stump protection, edema control 3. Mood: Motivated to get better and home. In good spirits at present 4. Neuropsych: This patient is capable of making decisions on her own behalf.  5. DM type 2: will monitor with ac/hs cbg checks. Resume sitagliptin and use SSI for elevated BS.  6. LLL infiltrate with persistent leucocytosis: Vanc/Zosyn 9/3-9/8--changed to Levaquin on 09/08 with recommendations for 7 total days of antibiotic  therapy. --completed. Still with leukocytosis today---follow clinically. Recent urine cx negative 7. Peripheral neuropathy: Will continue Lyrica, Cymbalta and elavil. Monitor for recurrent sedation.  8.  Dyslipidemia:Will continue lipitor.  9. Ongoing tobacco abuse: plans on quitting. Does not want to try nicotine patch at this time.  10. ABLA: Will continue to monitor H/H with serial checks. Check stool guaiacs.  11. HTN: Will monitor with bid checks. Off medications at this time.  12. Constipation: increased senokot s, prn meds available   LOS (Days) 1 A FACE TO FACE EVALUATION WAS PERFORMED  Traven Davids T 07/02/2013 7:53 AM

## 2013-07-02 NOTE — Evaluation (Signed)
Physical Therapy Assessment and Plan  Patient Details  Name: [name] [name] MRN: [id number] Date of Birth: 1955-11-24  PT Diagnosis: Difficulty walking, Edema, Impaired sensation, Muscle weakness and Pain in residual limb (R) Rehab Potential: Good ELOS: 7-10 days   Today's Date: 07/02/2013 Time: 4098-1191 Time Calculation (min): 56 min  Problem List:  Patient Active Problem List   Diagnosis Date Noted  . Ileus 06/24/2013  . Ischemic leg--R BKA 06/21/2013  . GI bleed 06/21/2013  . Acute blood loss anemia 06/21/2013  . PVD (peripheral vascular disease) 06/21/2013  . HTN (hypertension) 06/21/2013  . Diabetes 06/21/2013    Past Medical History:  Past Medical History  Diagnosis Date  . PVD (peripheral vascular disease) with claudication   . HTN (hypertension)   . Diabetes mellitus   . Peripheral neuropathy    Past Surgical History:  Past Surgical History  Procedure Laterality Date  . Esophagogastroduodenoscopy N/A 06/21/2013    Procedure: ESOPHAGOGASTRODUODENOSCOPY (EGD);  Surgeon: Barrie Folk, MD;  Location: [facility] ENDOSCOPY;  Service: Endoscopy;  Laterality: N/A;  . Amputation Right 06/22/2013    Procedure: AMPUTATION BELOW KNEE ;  Surgeon: Chuck Hint, MD;  Location: [facility] OR;  Service: Vascular;  Laterality: Right;  . Femoral-popliteal bypass graft Right 2013  . Thrombectomy      of right FPBG    Assessment & Plan Clinical Impression: Patient is a 57 y.o. year old female with recent admission to the hospital with history of severe PVD with FPBG and RLL thrombectomy at [facility] hospital few days PTA on 06/21/13 with ischemic RLE with foot pain and numbness as well as black stools. She underwent EGD by Dr. Madilyn Fireman and this was negative for source of bleeding with recommendations to monitor H/H and colonoscopy is indicated. She was evaluated by Dr. 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 Post op with fever and lethargy and was started on IV Vancomycin and Zosyn empirically  for early left basilar infiltrate. She continued to have melenotic stools and was transfused on 08/06 for drop in H/H to 7.0. Dr. Madilyn Fireman recommends colonoscopy once recovered from surgery sooner if bleeding recurs. Stool guaiacs recommended for monitoring. Coumadin resumed and is therapeutic. H/H has improved to 11.3. Therapies ongoing and patient continues to be limited by pain as well as high levels of anxiety. Patient transferred to CIR on 07/01/2013 .   Patient currently requires min with mobility secondary to muscle weakness and muscle joint tightness, decreased cardiorespiratoy endurance and decreased standing balance, decreased balance strategies and difficulty maintaining precautions.  Prior to hospitalization, patient was modified independent  with mobility and lived with Family (reports 75 yo grandson to move in with her) in a Apartment (duplex).  Home access is 1 + 1 (onto porch and then into house)Stairs to enter.  Patient will benefit from skilled PT intervention to maximize safe functional mobility, minimize fall risk and decrease caregiver burden for planned discharge home with intermittent assist.  Anticipate patient will benefit from follow up [facility] at discharge.  PT Assessment Rehab Potential: Good Barriers to Discharge: Decreased caregiver support (reports 80 yo grandson moving in) PT Patient demonstrates impairments in the following area(s): Balance;Edema;Endurance;Pain;Sensory;Skin Integrity PT Transfers Functional Problem(s): Bed Mobility;Bed to Chair;Car;Furniture PT Locomotion Functional Problem(s): Ambulation;Wheelchair Mobility;Stairs PT Plan PT Intensity: Minimum of 1-2 x/day ,45 to 90 minutes PT Frequency: 5 out of 7 days PT Duration Estimated Length of Stay: 7-10 days PT Treatment/Interventions: Ambulation/gait training;Balance/vestibular training;Community reintegration;Discharge planning;Disease management/prevention;DME/adaptive equipment instruction;Functional mobility  training;Neuromuscular re-education;Patient/family education;Pain management;Psychosocial support;Skin  care/wound management;Splinting/orthotics;Stair training;Therapeutic Activities;Therapeutic Exercise;UE/LE Strength taining/ROM;UE/LE Coordination activities;Wheelchair propulsion/positioning PT Transfers Anticipated Outcome(s): mod I  basic transfers; S car PT Locomotion Anticipated Outcome(s): mod I w/c mobility; mod I household gait; S for curb step for home entry PT Recommendation Follow Up Recommendations: Home health PT Patient destination: Home Equipment Recommended: Wheelchair (measurements);Wheelchair cushion (measurements) Equipment Details: Pt reports already owning RW  Skilled Therapeutic Intervention Individual treatment initiated with focus on functional transfers (with and without RW), w/c parts management and mobility, and gait training with RW. Pt required cues for hand placement with transfers and gait to increase safety. Education provided on desensitization and proper positioning of residual limb as pt with complaints of pain throughout session. Transferred and positioned in recliner at end of session with RLE elevated.   PT Evaluation Precautions/Restrictions Precautions Precautions: Fall Restrictions Weight Bearing Restrictions: Yes RLE Weight Bearing: Non weight bearing Pain  Premedicated for residual limb pain - education provided on positioning, ROM, and desensitization techniques. Home Living/Prior Functioning Home Living Available Help at Discharge: Available PRN/intermittently Type of Home: Apartment (duplex) Home Access: Stairs to enter Entrance Stairs-Number of Steps: 1 + 1 (onto porch and then into house) Home Layout: One level  Lives With: Family (reports 107 yo grandson to move in with her) Prior Function Level of Independence: Independent with homemaking with ambulation;Independent with basic ADLs;Independent with transfers Pt reports using RW in the  home PTA and cane out in community. Vision/Perception  Perception Perception: Within Functional Limits Praxis Praxis: Intact  Cognition Overall Cognitive Status: Within Functional Limits for tasks assessed Safety/Judgment: Appears intact Sensation Sensation Light Touch: Appears Intact (residual limb ACE wrapped; reports phantom pain) Proprioception: Appears Intact Coordination Gross Motor Movements are Fluid and Coordinated: Yes Motor  Motor Motor: Within Functional Limits (generalized weakness)  Locomotion  Ambulation Ambulation/Gait Assistance: 4: Min guard  Trunk/Postural Assessment  Cervical Assessment Cervical Assessment: Within Functional Limits Thoracic Assessment Thoracic Assessment: Within Functional Limits Lumbar Assessment Lumbar Assessment: Within Functional Limits  Balance Balance Balance Assessed: Yes Static Sitting Balance Static Sitting - Level of Assistance: 6: Modified independent (Device/Increase time) Dynamic Sitting Balance Dynamic Sitting - Level of Assistance: 5: Stand by assistance Static Standing Balance Static Standing - Level of Assistance: 4: Min assist Dynamic Standing Balance Dynamic Standing - Level of Assistance: 4: Min assist Extremity Assessment  RLE Assessment RLE Assessment: Exceptions to [facility] RLE Strength RLE Overall Strength Comments: R BKA; decreased extension noted (more with PROM); 3-/5 grossy LLE Assessment LLE Assessment: Within Functional Limits (decreased muscular endurance)  FIM:  FIM - Banker Devices: Arm rests Bed/Chair Transfer: 5: Supine > Sit: Supervision (verbal cues/safety issues);4: Bed > Chair or W/C: Min A (steadying Pt. > 75%);4: Chair or W/C > Bed: Min A (steadying Pt. > 75%) FIM - Locomotion: Wheelchair Locomotion: Wheelchair: 5: Travels 150 ft or more: maneuvers on rugs and over door sills with supervision, cueing or coaxing FIM - Locomotion: Ambulation Locomotion:  Ambulation Assistive Devices: Designer, industrial/product Ambulation/Gait Assistance: 4: Min guard Locomotion: Ambulation: 1: Travels less than 50 ft with minimal assistance (Pt.>75%)   Refer to Care Plan for Long Term Goals  Recommendations for other services: None  Discharge Criteria: Patient will be discharged from PT if patient refuses treatment 3 consecutive times without medical reason, if treatment goals not met, if there is a change in medical status, if patient makes no progress towards goals or if patient is discharged from hospital.  The above assessment, treatment plan, treatment  alternatives and goals were discussed and mutually agreed upon: by patient  Tedd Sias 07/02/2013, 9:42 AM

## 2013-07-02 NOTE — Progress Notes (Signed)
Occupational Therapy Session Note  Patient Details  Name: [name] [name] MRN: [id number] Date of Birth: November 24, 1955  Today's Date: 07/02/2013 Time: 1340-1415 Time Calculation (min): 35 min  Short Term Goals: Week 1:  OT Short Term Goal 1 (Week 1): Patient will complete bathing, seated, in standard tub using appropriate device, with supervision OT Short Term Goal 2 (Week 1): Patient will complete dressing, sitting and standing, with LRAD independently (Mod I) OT Short Term Goal 3 (Week 1): Patient will complete selected light homemaking task at w/c level with supervision OT Short Term Goal 4 (Week 1): Patient will complete grooming and hygiene at sink, in w/c, independently (Mod I).  Skilled Therapeutic Interventions/Progress Updates:  Patient resting in bed upon arrival stating 10/10 pain.  Notary staff member standing outside the the patient's door therefore assisted with notarizing a document in a Contact Precaution room.  Session primarily focused on education following an amputation secondary to patient declined out of bed at this time.  Issued an Designer, multimedia, reviewed booklet issued regarding caring for amputated limb to include regular inspection (patient very appreciative of the mirror), desensitization methods, benefit of compression and mobilization as well as keeping the knee extended instead of flexed.  Patient return demonstrated inspection of residual limb, massage and tapping, tolerated ace unwrap and wrap.  Patient reports that she will read the booklet tonight now that her family brought her glasses.  Patient left with all items within reach.   Therapy Documentation Precautions:  Precautions Precautions: Fall Restrictions Weight Bearing Restrictions: Yes RLE Weight Bearing: Non weight bearing Pain: Pain Assessment Pain Assessment: 0-10 Pain Score: 10-Worst pain ever Pain Type: Acute pain Pain Location: Leg Pain Orientation: Right Pain Descriptors / Indicators:  Stabbing Pain Onset: On-going Patients Stated Pain Goal: 3 Pain Intervention(s): Medication (See eMAR);Repositioned;Distraction Multiple Pain Sites: No  Therapy/Group: Individual Therapy  Wendolyn Raso 07/02/2013, 2:15 PM

## 2013-07-02 NOTE — Progress Notes (Signed)
ANTICOAGULATION CONSULT NOTE - Follow Up Consult  Pharmacy Consult for coumadin Indication: severe PVD  No Known Allergies  Patient Measurements: Height: 5\' 6"  (167.6 cm) Weight: 152 lb 1.6 oz (68.992 kg) IBW/kg (Calculated) : 59.3 Heparin Dosing Weight:   Vital Signs: Temp: 99.2 F (37.3 C) (09/10 0606) Temp src: Oral (09/10 0606) BP: 136/86 mmHg (09/10 0606) Pulse Rate: 80 (09/10 0606)  Labs:  Recent Labs  06/30/13 0445 07/01/13 0520 07/01/13 0738 07/02/13 0535  HGB 10.9* 11.3*  --  10.8*  HCT 33.4* 34.2*  --  31.9*  PLT 514* 571*  --  607*  LABPROT 21.1*  --  22.6* 20.0*  INR 1.89*  --  2.06* 1.76*  CREATININE 1.06 1.11*  --  1.14*    Estimated Creatinine Clearance: 50.4 ml/min (by C-G formula based on Cr of 1.14).   Medications:  Scheduled:  . allopurinol  100 mg Oral Daily  . amitriptyline  50 mg Oral QHS  . atorvastatin  40 mg Oral q1800  . DULoxetine  60 mg Oral Daily  . insulin aspart  0-9 Units Subcutaneous TID WC  . OxyCODONE  10 mg Oral Q12H  . pantoprazole  40 mg Oral BID  . pregabalin  300 mg Oral BID  . senna-docusate  1 tablet Oral BID  . Warfarin - Pharmacist Dosing Inpatient   Does not apply q1800   Infusions:    Assessment: 57 yo female with severe PVD is currently on subtherapeutic coumadin.  INR down to 1.76 from 2.06. Goal of Therapy:  INR 2-3 Monitor platelets by anticoagulation protocol: Yes   Plan:  1) Coumadin 5mg  po x1 2) INR in am  Altin Sease, Tsz-Yin 07/02/2013,8:46 AM

## 2013-07-03 ENCOUNTER — Inpatient Hospital Stay (HOSPITAL_COMMUNITY): Payer: PRIVATE HEALTH INSURANCE | Admitting: Occupational Therapy

## 2013-07-03 ENCOUNTER — Inpatient Hospital Stay (HOSPITAL_COMMUNITY): Payer: PRIVATE HEALTH INSURANCE

## 2013-07-03 LAB — GLUCOSE, CAPILLARY
Glucose-Capillary: 99 mg/dL (ref 70–99)
Glucose-Capillary: 103 mg/dL — ABNORMAL HIGH (ref 70–99)

## 2013-07-03 LAB — PROTIME-INR: Prothrombin Time: 21.9 seconds — ABNORMAL HIGH (ref 11.6–15.2)

## 2013-07-03 MED ORDER — METHOCARBAMOL 500 MG PO TABS
500.0000 mg | ORAL_TABLET | Freq: Four times a day (QID) | ORAL | Status: DC | PRN
  Administered 2013-07-03 – 2013-07-09 (×4): 500 mg via ORAL
  Filled 2013-07-03 (×3): qty 1

## 2013-07-03 MED ORDER — METHOCARBAMOL 500 MG PO TABS
500.0000 mg | ORAL_TABLET | Freq: Four times a day (QID) | ORAL | Status: DC
  Administered 2013-07-03 – 2013-07-11 (×30): 500 mg via ORAL
  Filled 2013-07-03 (×48): qty 1

## 2013-07-03 MED ORDER — WARFARIN SODIUM 4 MG PO TABS
4.0000 mg | ORAL_TABLET | Freq: Once | ORAL | Status: AC
  Administered 2013-07-03: 4 mg via ORAL
  Filled 2013-07-03: qty 1

## 2013-07-03 NOTE — Progress Notes (Signed)
ANTICOAGULATION CONSULT NOTE - Follow Up Consult  Pharmacy Consult for coumadin Indication: hx of severe PVD  No Known Allergies  Patient Measurements: Height: 5\' 6"  (167.6 cm) Weight: 152 lb 8.9 oz (69.2 kg) IBW/kg (Calculated) : 59.3 Heparin Dosing Weight:   Vital Signs: Temp: 98.6 F (37 C) (09/11 0558) Temp src: Oral (09/11 0558) BP: 132/75 mmHg (09/11 0558) Pulse Rate: 95 (09/11 0558)  Labs:  Recent Labs  07/01/13 0520 07/01/13 0738 07/02/13 0535 07/03/13 0530  HGB 11.3*  --  10.8*  --   HCT 34.2*  --  31.9*  --   PLT 571*  --  607*  --   LABPROT  --  22.6* 20.0* 21.9*  INR  --  2.06* 1.76* 1.98*  CREATININE 1.11*  --  1.14*  --     Estimated Creatinine Clearance: 50.4 ml/min (by C-G formula based on Cr of 1.14).   Medications:  Scheduled:  . allopurinol  100 mg Oral Daily  . amitriptyline  50 mg Oral QHS  . atorvastatin  40 mg Oral q1800  . DULoxetine  60 mg Oral Daily  . insulin aspart  0-9 Units Subcutaneous TID WC  . OxyCODONE  10 mg Oral Q12H  . pantoprazole  40 mg Oral BID  . pregabalin  300 mg Oral BID  . senna-docusate  1 tablet Oral BID  . Warfarin - Pharmacist Dosing Inpatient   Does not apply q1800   Infusions:    Assessment: 57 yo female with history of severe PVD is currently on subtherapeutic coumadin.  INR is up to 1.98 from 1.76.  Patient had history of GI bleed Goal of Therapy:  INR 2-3 Monitor platelets by anticoagulation protocol: Yes   Plan:  - Coumadin 4mg  po x1 - INR in am  Sohaib Vereen, Tsz-Yin 07/03/2013,8:27 AM

## 2013-07-03 NOTE — Progress Notes (Signed)
Occupational Therapy Session Notes  Patient Details  Name: [name] [name] MRN: [id number] Date of Birth: 02-Mar-1956  Today's Date: 07/03/2013  Short Term Goals: Week 1:  OT Short Term Goal 1 (Week 1): Patient will complete bathing, seated, in standard tub using appropriate device, with supervision OT Short Term Goal 2 (Week 1): Patient will complete dressing, sitting and standing, with LRAD independently (Mod I) OT Short Term Goal 3 (Week 1): Patient will complete selected light homemaking task at w/c level with supervision OT Short Term Goal 4 (Week 1): Patient will complete grooming and hygiene at sink, in w/c, independently (Mod I).  Skilled Therapeutic Interventions/Progress Updates:   Session #1 [phone] - 75 Minutes Individual Therapy Patient with 8/10 complaints of pain upon entering room, "It's getting better"; patient also stated she recently received pain meds Patient found seated edge of bed eating breakfast. Patient performed stand pivot transfer from edge of bed -> w/c with close supervision. From here, patient propelled w/c -> sink side for grooming tasks of brushing teeth, washing hands, and washing face. From here, patient engaged in UB/LB bathing and dressing tasks while at sink in sit<>stand position. Patient able to perform sit<>stands with supervision AND able to maintain dynamic standing for peri care with supervision, but required min assist to pull pants up to waist.Therapist recommended pants with an elastic waist band to increase independence with this task. Therapist and patient then discussed home set-up and prior living situations. Patient states she has some friends and family that can assist prn once she is discharged home; patient states she currently has a BSC, tub transfer bench, single point cane, and rolling walker at home for use. Therapist completed compression wrapping -> right residual limb and donned TED hose -> LLE per orders. At end of session, left patient  seated in w/c with call bell & phone within reach. Patient does fatigue easily, but is motivated to be as independent as possible.   Session #2 [phone] - 45 Minutes Individual Therapy Patient with 10/10 complaints of pain in right residual limb, RN aware Upon entering room, patient found supine in bed. Patient engaged in bed mobility, then transferred edge of bed -> w/c with supervision. From here, patient propelled self -> therapy gym. Once in gym patient engaged in UE strengthening exercise using ergometer for ~10 minutes (frontwards and backwards). After ergometer exercises, patient transferred onto therapy mat and therapist educated patient on an UE HEP using theraband and 2 lb dumbbell weights. Patient able to transfer back to w/c with supervision and propelled self back to room. Patient transferred to bed and therapist gathered ice pack to help with pain management -> right residual limb; did not put ice around incision area. Left patient supine in bed with call bell & phone within reach.   Precautions:  Precautions Precautions: Fall Restrictions Weight Bearing Restrictions: Yes RLE Weight Bearing: Non weight bearing  See FIM for current functional status  Emmalise Huard 07/03/2013, 7:22 AM

## 2013-07-03 NOTE — Progress Notes (Signed)
Physical Therapy Session Note  Patient Details  Name: [name] [name] MRN: [id number] Date of Birth: 02-17-1956  Today's Date: 07/03/2013 Time: 0900-0955 Time Calculation (min): 55 min  Short Term Goals: Week 1:  PT Short Term Goal 1 (Week 1): = LTGs  Skilled Therapeutic Interventions/Progress Updates:   Transferred OOB to w/c with S and self propelled w/c down to therapy gym for endurance and strengthening. Attempted to do curb step with RW (demonstrated to pt)  But with multiple attempts at standing pt unable to tolerate standing position long enough in order to attempt step due to pain in RLE. Worked on Nustep x 10 min on level 5 to functionally strengthen LLE and BUE's to aid with transfers and gait in seated position where pain was decreased. On mat in supine, worked on stretching to residual limb at hip and knee in attempt to decreased tightness/increased ROM and decrease pain with little success. Attempted standing again in preparation for gait, but pt unable to tolerate; x 3' to RW with RW and steady A. RN notified at end of session of pain limiting session and pt would like to try heat pack at adductors on R for pain relief.  Therapy Documentation Precautions:  Precautions Precautions: Fall Restrictions Weight Bearing Restrictions: Yes RLE Weight Bearing: Non weight bearing  Pain: C/o 5/10 pain at rest in residual limb and 20/10 pain when standing. Premedicated by RN.  See FIM for current functional status  Therapy/Group: Individual Therapy  [name] [name] [facility] 07/03/2013, 9:56 AM

## 2013-07-03 NOTE — IPOC Note (Signed)
Overall Plan of Care [facility]) Patient Details Name: Cabria Micalizzi MRN: [id number] DOB: 04/01/1956  Admitting Diagnosis: R BKA  Hospital Problems: Principal Problem:   Ischemic leg--R BKA Active Problems:   Acute blood loss anemia   PVD (peripheral vascular disease)   HTN (hypertension)   Diabetes     Functional Problem List: Nursing Pain;Safety;Sensory;Skin Integrity;Medication Management;Endurance;Edema  PT Balance;Edema;Endurance;Pain;Sensory;Skin Integrity  OT Balance;Endurance;Pain;Safety  SLP    TR         Basic ADL's: OT Dressing;Bathing;Toileting     Advanced  ADL's: OT Laundry;Light Housekeeping;Simple Meal Preparation     Transfers: PT Bed Mobility;Bed to Chair;Car;Furniture  OT Toilet;Tub/Shower     Locomotion: PT Ambulation;Wheelchair Mobility;Stairs     Additional Impairments: OT    SLP        TR      Anticipated Outcomes Item Anticipated Outcome  Self Feeding Mod I  Swallowing      Basic self-care  Mod I  Toileting  Mod I   Bathroom Transfers Mod I  Bowel/Bladder  patient will remain continent of bowel and bladder  Transfers  mod I  basic transfers; S car  Locomotion  mod I w/c mobility; mod I household gait; S for curb step for home entry  Communication     Cognition     Pain  3 or less on scale 0-10  Safety/Judgment  calls appropriately, supervision   Therapy Plan: PT Intensity: Minimum of 1-2 x/day ,45 to 90 minutes PT Frequency: 5 out of 7 days PT Duration Estimated Length of Stay: 7-10 days OT Intensity: Minimum of 1-2 x/day, 45 to 90 minutes OT Frequency: 5 out of 7 days OT Duration/Estimated Length of Stay: 7-10 days         Team Interventions: Nursing Interventions Patient/Family Education;Skin Care/Wound Management;Discharge Planning;Pain Management;Medication Management;Disease Management/Prevention  PT interventions Ambulation/gait training;Balance/vestibular training;Community reintegration;Discharge  planning;Disease management/prevention;DME/adaptive equipment instruction;Functional mobility training;Neuromuscular re-education;Patient/family education;Pain management;Psychosocial support;Skin care/wound management;Splinting/orthotics;Stair training;Therapeutic Activities;Therapeutic Exercise;UE/LE Strength taining/ROM;UE/LE Coordination activities;Wheelchair propulsion/positioning  OT Interventions Functional mobility training;Self Care/advanced ADL retraining;Wheelchair propulsion/positioning;Therapeutic Exercise;Therapeutic Activities;Patient/family education;Pain management  SLP Interventions    TR Interventions    SW/CM Interventions      Team Discharge Planning: Destination: PT-Home ,OT- Home , SLP-  Projected Follow-up: PT-Home health PT, OT-  None, SLP-  Projected Equipment Needs: PT-Wheelchair (measurements);Wheelchair cushion (measurements), OT- Wheelchair (measurements);Rolling walker with 5" wheels, SLP-  Patient/family involved in discharge planning: PT- Patient,  OT-Patient, SLP-   MD ELOS: 7-10 days Medical Rehab Prognosis:  Excellent Assessment: The patient has been admitted for CIR therapies. The team will be addressing, functional mobility, strength, stamina, balance, safety, adaptive techniques/equipment, self-care, bowel and bladder mgt, patient and caregiver education, pre-prosthetic education, pain mgt. Goals have been set at mod I with ADL's, w/c mobility, and household gait with a RW.    Ranelle Oyster, MD, FAAPMR      See Team Conference Notes for weekly updates to the plan of care

## 2013-07-03 NOTE — Progress Notes (Signed)
Subjective/Complaints: Pain only woke her up once last night. Had "pain on pain" with PT yesterday but was able to work through it.  A 12 point review of systems has been performed and if not noted above is otherwise negative.   Objective: Vital Signs: Blood pressure 132/75, pulse 95, temperature 98.6 F (37 C), temperature source Oral, resp. rate 18, height 5\' 6"  (1.676 m), weight 69.2 kg (152 lb 8.9 oz), SpO2 95.00%. No results found.  Recent Labs  07/01/13 0520 07/02/13 0535  WBC 11.5* 11.3*  HGB 11.3* 10.8*  HCT 34.2* 31.9*  PLT 571* 607*    Recent Labs  07/01/13 0520 07/02/13 0535  NA 141 137  K 3.5 3.7  CL 105 102  GLUCOSE 86 85  BUN 13 13  CREATININE 1.11* 1.14*  CALCIUM 9.4 9.3   CBG (last 3)   Recent Labs  07/02/13 1654 07/02/13 2038 07/03/13 0715  GLUCAP 104* 131* 99    Wt Readings from Last 3 Encounters:  07/03/13 69.2 kg (152 lb 8.9 oz)  07/01/13 72 kg (158 lb 11.7 oz)  07/01/13 72 kg (158 lb 11.7 oz)    Physical Exam:  Constitutional: She is oriented to person, place, and time. She appears well-developed and well-nourished.  HENT:  Head: Normocephalic and atraumatic.  Poor dentition.  Eyes: Conjunctivae are normal. Pupils are equal, round, and reactive to light.  Neck: Normal range of motion.  Cardiovascular: Normal rate and regular rhythm.  Pulmonary/Chest: Effort normal and breath sounds normal. No respiratory distress. She has no wheezes.  Abdominal: Soft. Bowel sounds are normal. She exhibits no distension. There is no tenderness.  Musculoskeletal: right stump tender, well shaped Neurological: She is alert and oriented to person, place, and time. Intact insight and awareness. Motor strength is 5/5 bilateral deltoid, bicep, tricep, grip Right hip flexor 2 /5---Left hip flexor 5/5 left knee extensor ankle dorsiflexor plantar flexor 5/5 Sensory exam is normal. Good sitting balance  Skin: Skin is warm and dry. Wound clean and intact with  staples. Minimal to no drainage   Assessment/Plan: 1. Functional deficits secondary to right BKA which require 3+ hours per day of interdisciplinary therapy in a comprehensive inpatient rehab setting. Physiatrist is providing close team supervision and 24 hour management of active medical problems listed below. Physiatrist and rehab team continue to assess barriers to discharge/monitor patient progress toward functional and medical goals. FIM: FIM - Bathing Bathing Steps Patient Completed: Chest;Right Arm;Left Arm;Abdomen;Front perineal area;Buttocks;Right upper leg;Left upper leg;Left lower leg (including foot) Bathing: 5: Set-up assist to: Obtain items  FIM - Upper Body Dressing/Undressing Upper body dressing/undressing: 0: Wears gown/pajamas-no public clothing FIM - Lower Body Dressing/Undressing Lower body dressing/undressing: 0: Wears Oceanographer     FIM - Diplomatic Services operational officer Devices: Grab bars Toilet Transfers: 2-To toilet/BSC: Max A (lift and lower assist);3-From toilet/BSC: Mod A (lift or lower assist)  FIM - Bed/Chair Transfer Bed/Chair Transfer Assistive Devices: Arm rests Bed/Chair Transfer: 5: Supine > Sit: Supervision (verbal cues/safety issues);4: Bed > Chair or W/C: Min A (steadying Pt. > 75%);4: Chair or W/C > Bed: Min A (steadying Pt. > 75%)  FIM - Locomotion: Wheelchair Locomotion: Wheelchair: 5: Travels 150 ft or more: maneuvers on rugs and over door sills with supervision, cueing or coaxing FIM - Locomotion: Ambulation Locomotion: Ambulation Assistive Devices: Designer, industrial/product Ambulation/Gait Assistance: 4: Min guard Locomotion: Ambulation: 1: Travels less than 50 ft with minimal assistance (Pt.>75%)  Comprehension Comprehension Mode: Auditory Comprehension: 6-Follows  complex conversation/direction: With extra time/assistive device  Expression Expression Mode: Verbal Expression: 6-Expresses complex ideas: With  extra time/assistive device  Social Interaction Social Interaction: 6-Interacts appropriately with others with medication or extra time (anti-anxiety, antidepressant).  Problem Solving Problem Solving: 6-Solves complex problems: With extra time  Memory Memory: 6-More than reasonable amt of time Medical Problem List and Plan:  1. PVD/ RLE thrombectomy: Pharmaceutical: Coumadin  2. Pain Management: will likely need adjustment in medication for better management. Will start OxyContin for more consistent pain management which should help with tolerance of activity. '  -lyrica for phantom limb pain as well  -stump protection, edema control 3. Mood: Motivated to get better and home. In good spirits at present 4. Neuropsych: This patient is capable of making decisions on her own behalf.  5. DM type 2: will monitor with ac/hs cbg checks. Resume sitagliptin and use SSI for elevated BS.  6. LLL infiltrate with persistent leucocytosis: Vanc/Zosyn 9/3-9/8--changed to Levaquin on 09/08 with recommendations for 7 total days of antibiotic therapy. --completed. Still with leukocytosis today---follow clinically. Recent urine cx negative 7. Peripheral neuropathy: Will continue Lyrica, Cymbalta and elavil. Pt is alert  8. Dyslipidemia:Will continue lipitor.  9. Ongoing tobacco abuse: plans on quitting. Does not want to try nicotine patch at this time.  10. ABLA: Will continue to monitor H/H with serial checks. Stool OB -.  11. HTN: Will monitor with bid checks. Off medications at this time.  12. Constipation: increased senokot s, prn meds available   LOS (Days) 2 A FACE TO FACE EVALUATION WAS PERFORMED  [name] [name] T 07/03/2013 7:34 AM

## 2013-07-03 NOTE — Progress Notes (Signed)
Physical Therapy Session Note  Patient Details  Name: [name] [name] MRN: [id number] Date of Birth: 04/07/1956  Today's Date: 07/03/2013 Time: 1300-1330 Time Calculation (min): 30 min  Short Term Goals: Week 1:  PT Short Term Goal 1 (Week 1): = LTGs  Skilled Therapeutic Interventions/Progress Updates:    Session again limited by pain. Pt unable to tolerate standing or gait activities. Pt request to work on supine stretching and ROM to residual limb again focusing on knee extension and activation of quad as well as anterior hip stretch in supine. Transfers S/mod I squat pivot during session.   Therapy Documentation Precautions:  Precautions Precautions: Fall Restrictions Weight Bearing Restrictions: Yes RLE Weight Bearing: Non weight bearing Pain: C/o 9/10 pain in residual limb - RN aware.  See FIM for current functional status  Therapy/Group: Individual Therapy  Karolee Stamps [facility] 07/03/2013, 1:33 PM

## 2013-07-04 ENCOUNTER — Inpatient Hospital Stay (HOSPITAL_COMMUNITY): Payer: PRIVATE HEALTH INSURANCE | Admitting: Occupational Therapy

## 2013-07-04 ENCOUNTER — Inpatient Hospital Stay (HOSPITAL_COMMUNITY): Payer: PRIVATE HEALTH INSURANCE | Admitting: Physical Therapy

## 2013-07-04 ENCOUNTER — Inpatient Hospital Stay (HOSPITAL_COMMUNITY): Payer: PRIVATE HEALTH INSURANCE | Admitting: *Deleted

## 2013-07-04 DIAGNOSIS — S88119A Complete traumatic amputation at level between knee and ankle, unspecified lower leg, initial encounter: Secondary | ICD-10-CM

## 2013-07-04 DIAGNOSIS — L98499 Non-pressure chronic ulcer of skin of other sites with unspecified severity: Secondary | ICD-10-CM

## 2013-07-04 DIAGNOSIS — I739 Peripheral vascular disease, unspecified: Secondary | ICD-10-CM

## 2013-07-04 LAB — GLUCOSE, CAPILLARY
Glucose-Capillary: 124 mg/dL — ABNORMAL HIGH (ref 70–99)
Glucose-Capillary: 117 mg/dL — ABNORMAL HIGH (ref 70–99)

## 2013-07-04 LAB — PROTIME-INR
INR: 1.94 — ABNORMAL HIGH (ref 0.00–1.49)
Prothrombin Time: 21.6 seconds — ABNORMAL HIGH (ref 11.6–15.2)

## 2013-07-04 MED ORDER — OXYCODONE HCL ER 10 MG PO T12A
20.0000 mg | EXTENDED_RELEASE_TABLET | Freq: Two times a day (BID) | ORAL | Status: DC
  Administered 2013-07-04 – 2013-07-11 (×15): 20 mg via ORAL
  Filled 2013-07-04 (×15): qty 2

## 2013-07-04 MED ORDER — WARFARIN SODIUM 5 MG PO TABS
5.0000 mg | ORAL_TABLET | Freq: Once | ORAL | Status: AC
  Administered 2013-07-04: 5 mg via ORAL
  Filled 2013-07-04: qty 1

## 2013-07-04 NOTE — Progress Notes (Signed)
Physical Therapy Session Note  Patient Details  Name: [name] [name] MRN: [id number] Date of Birth: 02/16/1956  Today's Date: 07/04/2013 Time: 4098-1191 Time Calculation (min): 60 min  Short Term Goals: Week 1:  PT Short Term Goal 1 (Week 1): = LTGs  Skilled Therapeutic Interventions/Progress Updates:    Again limited by pain but did well with small bouts of activity if followed by promise of supine rest break in between.... and LOTS of cheerleading. Standing balance with one UE support ring toss, supervision. Step up/down 4" step with min/mod assist primarily for attempt to lift walker up step. Pt able to perform backwards but was unable to bring RW up/down due to pain. It took pt 5 standing attempts before she was ready to try step. Pt also reports she has 2 steps to enter.   Ambulation x 11' with RW min-guard assist. Pt took excessive amounts of time to begin ambulation, multiple sit <> stands limited due to pain. Cues for redirecting thought away from pain and focusing on external targets, does well for minutes at a time then returns to focus on pain in Rt. Residual limb.Pt returned to bed with supervision at end of session per request. Able to set up chair without cues.   Therapy Documentation Precautions:  Precautions Precautions: Fall Restrictions Weight Bearing Restrictions: Yes RLE Weight Bearing: Non weight bearing Pain: Pain Assessment Pain Score: 10-Worst pain ever Pain Type: Surgical pain Pain Location: Leg Pain Orientation: Right Pain Descriptors / Indicators: Aching;Sore Pain Onset: On-going (worse with standing) Pain Intervention(s): Repositioned;Elevated extremity;Cold applied (cold applied at end of session)  See FIM for current functional status  Therapy/Group: Individual Therapy  Wilhemina Bonito 07/04/2013, 12:16 PM

## 2013-07-04 NOTE — Progress Notes (Signed)
Occupational Therapy Note Patient Details  Name: Luticia Tadros MRN: [id number] Date of Birth: 1956-09-13 Today's Date: 07/04/2013  Time: 1500-1530  (30 min) Pain: 10/10 Right leg phantom pain  Individual session   Pt. Sleeping in bed upon OT arrival.  Addressed wc mobility, transfers and laundry.  Pt propelled wc from room to ADL apartment and transferred to tub bench and then to 3n1 over toilet with supervision.  She reported she had a 3n1 1 at home that she has never used.  She also has a tub bench but no hH shower head.  Gathered clothes and took to laundry room where pt did laundry.  Propelled self back to room and left in room with call bell in hand.    Humberto Seals 07/04/2013, 3:15 PM

## 2013-07-04 NOTE — Progress Notes (Signed)
Social Work Assessment and Plan  Patient Details  Name: [name] [name] MRN: [id number] Date of Birth: Aug 09, 1956  Today's Date: 07/04/2013  Problem List:  Patient Active Problem List   Diagnosis Date Noted  . Ileus 06/24/2013  . Ischemic leg--R BKA 06/21/2013  . GI bleed 06/21/2013  . Acute blood loss anemia 06/21/2013  . PVD (peripheral vascular disease) 06/21/2013  . HTN (hypertension) 06/21/2013  . Diabetes 06/21/2013   Past Medical History:  Past Medical History  Diagnosis Date  . PVD (peripheral vascular disease) with claudication   . HTN (hypertension)   . Diabetes mellitus   . Peripheral neuropathy    Past Surgical History:  Past Surgical History  Procedure Laterality Date  . Esophagogastroduodenoscopy N/A 06/21/2013    Procedure: ESOPHAGOGASTRODUODENOSCOPY (EGD);  Surgeon: Barrie Folk, MD;  Location: [facility] ENDOSCOPY;  Service: Endoscopy;  Laterality: N/A;  . Amputation Right 06/22/2013    Procedure: AMPUTATION BELOW KNEE ;  Surgeon: Chuck Hint, MD;  Location: [facility] OR;  Service: Vascular;  Laterality: Right;  . Femoral-popliteal bypass graft Right 2013  . Thrombectomy      of right FPBG   Social History:  reports that she has been smoking.  She does not have any smokeless tobacco history on file. Her alcohol and drug histories are not on file.  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   Pyschiatric History: Depression - takes Cymbalta Substance Abuse History: none reported  Patient / Family Perceptions, Expectations & Goals Pt/Family understanding of illness & functional limitations: Pt is accepting of new BKA and actually wishes she had had it done instead of multiple other surgeries with many hospitalizations Premorbid pt/family roles/activities: Pt used to walk 3 miles a day.  She would like to get back to that. Anticipated changes in roles/activities/participation: Pt plans to do everything for herself that she was doing PTA.  She plans to remain independent. Pt/family expectations/goals: Pt would like "to learn how to walk with my fake foot."  [employer]: None Premorbid Home Care/DME Agencies: Other (Comment) ([facility]) Transportation available at discharge: cousin Resource referrals recommended: Support group (specify) (amputee support group)  Discharge Planning Living Arrangements: Other relatives Support Systems: Other relatives;Friends/neighbors Type of Residence: Private residence Insurance Resources: Medicare;Medicaid (specify county) ([employer]HC Medicare and [place] Financial Resources: [employer] Financial Screen Referred: No Living Expenses: Rent Money Management: Patient Does the patient have any problems obtaining your medications?: No Home Management: Pt plans to return to home management needs as she was doing PTA Patient/Family Preliminary Plans: Pt to return to her home with prn assistance from neighbors and extended family. Social Work  Anticipated Follow Up Needs: HH/OP;Other (comment) (DME-has RW, BSC, Shower Chair) DC Planning Additional Notes/Comments: Pt feels  comfortable and confident that she can manage fine at her home with intermittent support from neighbors and extended family PRN. Expected length of stay: ELOS 7 to 10 days  Clinical Impression CSW met with this very animated, positive lady to assess how she is coping with new BKA and to discuss d/c plans.  Pt is very independent and feels comfortable returning to her duplex with intermittent support from her neighbors and extended family.  Pt would like to have a pharmacy that can deliver her medications to her.  CSW located [facility] in [place] who will deliver to her.  Pt was also interested in having a notary come to her room so that her signature could be witnessed on a bank slip for her grandson to be able to go to the bank and withdrawal money.  CSW arranged for Gavin Pound from [employer] to assist with this.  Pt was grateful to be able to get this accomplished.  Pt is also appreciative of the care she has received at [facility], as this is her first visit and she is pleased.  Pt worked as a Lawyer in [place] and feels comfortable with her condition and her care.  Pt has some DME and CSW will order any new DME required prior to d/c.  CSW will continue to follow pt throughout her rehab stay and assist as needed.  Lakeithia Rasor, [place] 07/04/2013, 10:01 AM

## 2013-07-04 NOTE — Progress Notes (Signed)
Subjective/Complaints: Right leg throbbing. Especially hurts when she stands up. A 12 point review of systems has been performed and if not noted above is otherwise negative.   Objective: Vital Signs: Blood pressure 114/74, pulse 77, temperature 98.6 F (37 C), temperature source Oral, resp. rate 19, height 5\' 6"  (1.676 m), weight 70 kg (154 lb 5.2 oz), SpO2 93.00%. No results found.  Recent Labs  07/02/13 0535  WBC 11.3*  HGB 10.8*  HCT 31.9*  PLT 607*    Recent Labs  07/02/13 0535  NA 137  K 3.7  CL 102  GLUCOSE 85  BUN 13  CREATININE 1.14*  CALCIUM 9.3   CBG (last 3)   Recent Labs  07/03/13 1152 07/03/13 1645 07/03/13 2058  GLUCAP 103* 114* 136*    Wt Readings from Last 3 Encounters:  07/04/13 70 kg (154 lb 5.2 oz)  07/01/13 72 kg (158 lb 11.7 oz)  07/01/13 72 kg (158 lb 11.7 oz)    Physical Exam:  Constitutional: She is oriented to person, place, and time. She appears well-developed and well-nourished.  HENT:  Head: Normocephalic and atraumatic.  Poor dentition.  Eyes: Conjunctivae are normal. Pupils are equal, round, and reactive to light.  Neck: Normal range of motion.  Cardiovascular: Normal rate and regular rhythm.  Pulmonary/Chest: Effort normal and breath sounds normal. No respiratory distress. She has no wheezes.  Abdominal: Soft. Bowel sounds are normal. She exhibits no distension. There is no tenderness.  Musculoskeletal: right stump tender, well shaped, edema reduced Neurological: She is alert and oriented to person, place, and time. Intact insight and awareness. Motor strength is 5/5 bilateral deltoid, bicep, tricep, grip Right hip flexor 2 /5---Left hip flexor 5/5 left knee extensor ankle dorsiflexor plantar flexor 5/5 Sensory exam is normal. Good sitting balance  Skin: Skin is warm and dry. Wound clean and intact with staples.    Assessment/Plan: 1. Functional deficits secondary to right BKA which require 3+ hours per day of  interdisciplinary therapy in a comprehensive inpatient rehab setting. Physiatrist is providing close team supervision and 24 hour management of active medical problems listed below. Physiatrist and rehab team continue to assess barriers to discharge/monitor patient progress toward functional and medical goals. FIM: FIM - Bathing Bathing Steps Patient Completed: Right Arm;Chest;Left Arm;Abdomen;Front perineal area;Buttocks;Right upper leg;Left upper leg;Left lower leg (including foot) Bathing: 5: Supervision: Safety issues/verbal cues  FIM - Upper Body Dressing/Undressing Upper body dressing/undressing steps patient completed: Thread/unthread right bra strap;Thread/unthread left bra strap;Hook/unhook bra;Thread/unthread right sleeve of pullover shirt/dresss;Thread/unthread left sleeve of pullover shirt/dress;Put head through opening of pull over shirt/dress;Pull shirt over trunk Upper body dressing/undressing: 7: Complete Independence: No helper FIM - Lower Body Dressing/Undressing Lower body dressing/undressing steps patient completed: Thread/unthread right underwear leg;Thread/unthread left underwear leg;Pull underwear up/down;Thread/unthread right pants leg;Thread/unthread left pants leg;Pull pants up/down;Don/Doff left sock Lower body dressing/undressing: 4: Min-Patient completed 75 plus % of tasks  FIM - Toileting Toileting steps completed by patient: Adjust clothing prior to toileting;Performs perineal hygiene;Adjust clothing after toileting Toileting Assistive Devices: Grab bar or rail for support Toileting: 6: Assistive device: No helper  FIM - Diplomatic Services operational officer Devices: Therapist, music Transfers: 0-Activity did not occur  FIM - Banker Devices: Arm rests Bed/Chair Transfer: 6: Supine > Sit: No assist;6: Sit > Supine: No assist;5: Bed > Chair or W/C: Supervision (verbal cues/safety issues);5: Chair or W/C > Bed:  Supervision (verbal cues/safety issues)  FIM - Locomotion: Wheelchair Locomotion: Wheelchair: 5: [employer]  150 ft or more: maneuvers on rugs and over door sills with supervision, cueing or coaxing FIM - Locomotion: Ambulation Locomotion: Ambulation Assistive Devices: Walker - Rolling Ambulation/Gait Assistance: 4: Min guard Locomotion: Ambulation: 1: Travels less than 50 ft with minimal assistance (Pt.>75%)  Comprehension Comprehension Mode: Auditory Comprehension: 6-Follows complex conversation/direction: With extra time/assistive device  Expression Expression Mode: Verbal Expression: 6-Expresses complex ideas: With extra time/assistive device  Social Interaction Social Interaction: 6-Interacts appropriately with others with medication or extra time (anti-anxiety, antidepressant).  Problem Solving Problem Solving: 6-Solves complex problems: With extra time  Memory Memory: 6-More than reasonable amt of time Medical Problem List and Plan:  1. PVD/ RLE thrombectomy: Pharmaceutical: Coumadin  2. Pain Management: will likely need adjustment in medication for better management.   -increase oxycontin to 20mg  q12 for better pain control  -lyrica for phantom limb pain as well    -stump protection, edema control 3. Mood: Motivated to get better and home. In good spirits at present 4. Neuropsych: This patient is capable of making decisions on her own behalf.  5. DM type 2: will monitor with ac/hs cbg checks. Resumed sitagliptin and use SSI for elevated BS.  6. LLL infiltrate with persistent leucocytosis: Vanc/Zosyn 9/3-9/8--changed to Levaquin on 09/08 with recommendations for 7 total days of antibiotic therapy. --completed. Still with leukocytosis today---follow clinically. Recent urine cx negative 7. Peripheral neuropathy: Will continue Lyrica, Cymbalta and elavil. Pt is alert  8. Dyslipidemia:Will continue lipitor.  9. Ongoing tobacco abuse: plans on quitting. Does not want to try  nicotine patch at this time.  10. ABLA: Will continue to monitor H/H with serial checks. Stool OB 11. HTN: Will monitor with bid checks. Off medications at this time.  12. Constipation: increased senokot s, prn meds available   LOS (Days) 3 A FACE TO FACE EVALUATION WAS PERFORMED  Broly Hatfield T 07/04/2013 7:39 AM

## 2013-07-04 NOTE — Progress Notes (Addendum)
Inpatient Rehabilitation Center Individual Statement of Services  Patient Name:  [name] [name]  Date:  07/04/2013  Welcome to the Inpatient Rehabilitation Center.  Our goal is to provide you with an individualized program based on your diagnosis and situation, designed to meet your specific needs.  With this comprehensive rehabilitation program, you will be expected to participate in at least 3 hours of rehabilitation therapies Monday-Friday, with modified therapy programming on the weekends.  Your rehabilitation program will include the following services:  Physical Therapy (PT), Occupational Therapy (OT), Speech Therapy (ST), 24 hour per day rehabilitation nursing, Neuropsychology, Case Management (Social Worker), Rehabilitation Medicine, Nutrition Services and Pharmacy Services  Weekly team conferences will be held on Tuesdays to discuss your progress.  Your Social Worker will talk with you frequently to get your input and to update you on team discussions.  Team conferences with you and your family in attendance may also be held.  Expected length of stay:  7-10 days  Overall anticipated outcome:  Modified Independent and Supervision for steps and car transfers  Depending on your progress and recovery, your program may change. Your Social Worker will coordinate services and will keep you informed of any changes. Your Social Worker's name and contact numbers are listed  below.  The following services may also be recommended but are not provided by the Inpatient Rehabilitation Center:   Driving Evaluations  Home Health Rehabiltiation Services  Outpatient Rehabilitation Services   Arrangements will be made to provide these services after discharge if needed.  Arrangements include referral to agencies that provide these services.  Your insurance has been verified to be:  [facility] Medicare/Medicaid Your primary doctor is:  Dr. Margaretann Loveless  [phone]  Pertinent information will be shared with  your doctor and your insurance company.  Social Worker:  Staci Acosta, LCSW  [phone] or [phone]  Information discussed with and copy given to patient by: Elvera Lennox, 07/04/2013, 8:50 AM

## 2013-07-04 NOTE — Progress Notes (Signed)
Occupational Therapy Session Notes  Patient Details  Name: [name] [name] MRN: [id number] Date of Birth: 11-Jul-1956  Today's Date: 07/04/2013  Short Term Goals: Week 1:  OT Short Term Goal 1 (Week 1): Patient will complete bathing, seated, in standard tub using appropriate device, with supervision OT Short Term Goal 2 (Week 1): Patient will complete dressing, sitting and standing, with LRAD independently (Mod I) OT Short Term Goal 3 (Week 1): Patient will complete selected light homemaking task at w/c level with supervision OT Short Term Goal 4 (Week 1): Patient will complete grooming and hygiene at sink, in w/c, independently (Mod I).  Skilled Therapeutic Interventions/Progress Updates:   Session #1 [id number] - 55 Minutes Individual Therapy Patient with 10/10 complaints of pain in right residual limb. Patient stated she recently had pain medication. Upon entering room, patient found supine in bed trying to eat breakfast. Therapist encouraged patient to sit edge of bed to finish breakfast; patient willing. After breakfast, patient transferred into w/c with distant supervision and propelled to sink to start bathing & dressing tasks. Patient with request to use bathroom. Patient propelled self into bathroom for toilet transfer. Therapist educated patient on safest and most effective way to perform toilet transfer; set-up of w/c. Patient performed toielting tasks (peri care and clothing management) with supervision and transfers with supervision. Patient then propelled self back to sink to complete bathing & dressing tasks. Patient able to complete bathing & dressing tasks at an independent to supervision level; supervision mainly during dynamic standing tasks. Patient donned TED hose independently. Therapist then doffed ace wrap from right residual limb, patient performed self-check with mirror, and therapist completed compression wrap to right residual limb using ace wrap. Patient then propelled self  from room-> therapy gym for BUE strengthening exercise using ergometer machine for ~10 minutes. Patient left with PT at end of OT session.   Session #2 [id number] - 30 Minutes Individual Therapy Patient with 10/10 complaints of pain; RN aware Patient found supine in bed upon entering room. Patient engaged in bed mobility then transferred into w/c with distant supervision. From here, patient propelled self -> ADL apartment. In ADL apartment focused on functional transfers on/off couch(supervision level). Therapist educated patient on safest & most effective way to perform transfer. Therapist recommended patient remove amputee pad leg rest when transferring to her right side. Therapist also encouraged patient to straighten right knee in order to prepare for a prosthesis. Patient then engaged in BUE exercises while seated on couch using 5lb weighted bar. After exercises, patient propelled self back to room. Therapist left patient seated in w/c with call bell & phone within reach.   Precautions:  Precautions Precautions: Fall Restrictions Weight Bearing Restrictions: Yes RLE Weight Bearing: Non weight bearing  See FIM for current functional status  [name] 07/04/2013, 7:21 AM

## 2013-07-04 NOTE — Progress Notes (Signed)
ANTICOAGULATION CONSULT NOTE - Follow Up Consult  Pharmacy Consult for coumadin Indication: hx of severe PVD  No Known Allergies  Patient Measurements: Height: 5\' 6"  (167.6 cm) Weight: 154 lb 5.2 oz (70 kg) IBW/kg (Calculated) : 59.3 Heparin Dosing Weight:   Vital Signs: Temp: 98.6 F (37 C) (09/12 0617) Temp src: Oral (09/12 0617) BP: 114/74 mmHg (09/12 0617) Pulse Rate: 77 (09/12 0617)  Labs:  Recent Labs  07/02/13 0535 07/03/13 0530 07/04/13 0730  HGB 10.8*  --   --   HCT 31.9*  --   --   PLT 607*  --   --   LABPROT 20.0* 21.9* 21.6*  INR 1.76* 1.98* 1.94*  CREATININE 1.14*  --   --     Estimated Creatinine Clearance: 50.4 ml/min (by C-G formula based on Cr of 1.14).   Medications:  Scheduled:  . allopurinol  100 mg Oral Daily  . amitriptyline  50 mg Oral QHS  . atorvastatin  40 mg Oral q1800  . DULoxetine  60 mg Oral Daily  . insulin aspart  0-9 Units Subcutaneous TID WC  . methocarbamol  500 mg Oral QID  . OxyCODONE  20 mg Oral Q12H  . pantoprazole  40 mg Oral BID  . pregabalin  300 mg Oral BID  . senna-docusate  1 tablet Oral BID  . Warfarin - Pharmacist Dosing Inpatient   Does not apply q1800   Infusions:    Assessment: 57 yo female with history of severe PVD is currently coumadin. INR (1.94) is slightly subtherapeutic today.  Patient had history of GI bleed, EGD on 8/30 - no source of significant GIB. Pt was on levaquin 8/8-8/10. No new cbc today, no bleeding reported per chart.  Goal of Therapy:  INR 2-3 Monitor platelets by anticoagulation protocol: Yes   Plan:  - Coumadin 5mg  po x1 - INR in am  Riki Rusk 07/04/2013,1:19 PM

## 2013-07-05 ENCOUNTER — Inpatient Hospital Stay (HOSPITAL_COMMUNITY): Payer: PRIVATE HEALTH INSURANCE | Admitting: Physical Therapy

## 2013-07-05 ENCOUNTER — Inpatient Hospital Stay (HOSPITAL_COMMUNITY): Payer: PRIVATE HEALTH INSURANCE | Admitting: Occupational Therapy

## 2013-07-05 ENCOUNTER — Inpatient Hospital Stay (HOSPITAL_COMMUNITY): Payer: PRIVATE HEALTH INSURANCE | Admitting: *Deleted

## 2013-07-05 DIAGNOSIS — E119 Type 2 diabetes mellitus without complications: Secondary | ICD-10-CM

## 2013-07-05 DIAGNOSIS — I739 Peripheral vascular disease, unspecified: Secondary | ICD-10-CM

## 2013-07-05 DIAGNOSIS — I10 Essential (primary) hypertension: Secondary | ICD-10-CM

## 2013-07-05 DIAGNOSIS — I999 Unspecified disorder of circulatory system: Secondary | ICD-10-CM

## 2013-07-05 LAB — GLUCOSE, CAPILLARY
Glucose-Capillary: 93 mg/dL (ref 70–99)
Glucose-Capillary: 156 mg/dL — ABNORMAL HIGH (ref 70–99)
Glucose-Capillary: 141 mg/dL — ABNORMAL HIGH (ref 70–99)

## 2013-07-05 LAB — PROTIME-INR
INR: 2.09 — ABNORMAL HIGH (ref 0.00–1.49)
Prothrombin Time: 22.8 seconds — ABNORMAL HIGH (ref 11.6–15.2)

## 2013-07-05 MED ORDER — SORBITOL 70 % SOLN
30.0000 mL | Freq: Every day | Status: DC | PRN
  Administered 2013-07-05 – 2013-07-10 (×3): 30 mL via ORAL
  Filled 2013-07-05 (×3): qty 30

## 2013-07-05 MED ORDER — WARFARIN SODIUM 5 MG PO TABS
5.0000 mg | ORAL_TABLET | Freq: Once | ORAL | Status: AC
  Administered 2013-07-05: 5 mg via ORAL
  Filled 2013-07-05: qty 1

## 2013-07-05 MED ORDER — OXYCODONE HCL 5 MG PO TABS
ORAL_TABLET | ORAL | Status: AC
  Filled 2013-07-05: qty 2

## 2013-07-05 NOTE — Progress Notes (Addendum)
[name] [name] is a 57 y.o. female 1956/05/17 [id number]  Subjective: No new complaints. Eating breakfast. No new problems. Slept well. Feeling OK.  Objective: Vital signs in last 24 hours: Temp:  [98.9 F (37.2 C)-99.2 F (37.3 C)] 98.9 F (37.2 C) (09/13 0606) Pulse Rate:  [94-95] 94 (09/13 0606) Resp:  [19-20] 19 (09/13 0606) BP: (126-131)/(55-74) 126/74 mmHg (09/13 0606) SpO2:  [94 %-97 %] 94 % (09/13 0606) Weight:  [152 lb 12.8 oz (69.31 kg)] 152 lb 12.8 oz (69.31 kg) (09/13 0606) Weight change: -1 lb 8.4 oz (-0.69 kg) Last BM Date: 07/01/13 (pt refused laxative)  Intake/Output from previous day: 09/12 0701 - 09/13 0700 In: 720 [P.O.:720] Out: -  Last cbgs: CBG (last 3)   Recent Labs  07/04/13 1647 07/04/13 2110 07/05/13 0725  GLUCAP 117* 301* 93     Physical Exam General: No apparent distress    HEENT: moist mucosa Lungs: Normal effort. Lungs clear to auscultation, no crackles or wheezes. Cardiovascular: Regular rate and rhythm, no edema Musculoskeletal:  No change from before Neurological: No new neurological deficits Wounds: R leg stump is dressed Skin: clear Alert, cooperative   Lab Results: BMET    Component Value Date/Time   NA 137 07/02/2013 0535   K 3.7 07/02/2013 0535   CL 102 07/02/2013 0535   CO2 25 07/02/2013 0535   GLUCOSE 85 07/02/2013 0535   BUN 13 07/02/2013 0535   CREATININE 1.14* 07/02/2013 0535   CALCIUM 9.3 07/02/2013 0535   GFRNONAA 52* 07/02/2013 0535   GFRAA 60* 07/02/2013 0535   CBC    Component Value Date/Time   WBC 11.3* 07/02/2013 0535   RBC 3.75* 07/02/2013 0535   HGB 10.8* 07/02/2013 0535   HCT 31.9* 07/02/2013 0535   PLT 607* 07/02/2013 0535   MCV 85.1 07/02/2013 0535   MCH 28.8 07/02/2013 0535   MCHC 33.9 07/02/2013 0535   RDW 16.2* 07/02/2013 0535   LYMPHSABS 2.1 07/02/2013 0535   MONOABS 0.9 07/02/2013 0535   EOSABS 0.2 07/02/2013 0535   BASOSABS 0.0 07/02/2013 0535    Studies/Results: No results found.  Medications: I  have reviewed the patient's current medications.  Assessment/Plan:  1. PVD/ RLE thrombectomy: Pharmaceutical: Coumadin  2. Pain Management: will likely need adjustment in medication for better management.  -increase oxycontin to 20mg  q12 for better pain control  -lyrica for phantom limb pain as well  -stump protection, edema control  3. Mood: Motivated to get better and home. In good spirits at present  4. Neuropsych: This patient is capable of making decisions on her own behalf.  5. DM type 2: will monitor with ac/hs cbg checks. Resumed sitagliptin and use SSI for elevated BS.  6. LLL infiltrate with persistent leucocytosis: Vanc/Zosyn 9/3-9/8--changed to Levaquin on 09/08 with recommendations for 7 total days of antibiotic therapy. --completed. Still with leukocytosis today---follow clinically. Recent urine cx negative  7. Peripheral neuropathy: Will continue Lyrica, Cymbalta and elavil. Pt is alert  8. Dyslipidemia:Will continue lipitor.  9. Ongoing tobacco abuse: plans on quitting. Does not want to try nicotine patch at this time.  10. ABLA: Will continue to monitor H/H with serial checks. Stool OB  11. HTN: Will monitor with bid checks. Off medications at this time.  12. Constipation: increased senokot s, prn meds available     Length of stay, days: 4  Sonda Primes , MD 07/05/2013, 8:08 AM

## 2013-07-05 NOTE — Progress Notes (Signed)
Physical Therapy Session Note  Patient Details  Name: [name] [name] MRN: [id number] Date of Birth: [birth year]  Today's Date: 07/05/2013 Time: 1000-1055 Time Calculation (min): 55 min  Short Term Goals: Week 1:  PT Short Term Goal 1 (Week 1): = LTGs  Skilled Therapeutic Interventions/Progress Updates:  Pt was seen bedside in the am. Pt propelled w/c to gym with B UEs about 150 feet with S. Static stretching R knee as tolerated to promote full extension of R knee. Pt transfers sit to stand with S and rolling walker. Pt ambulated 11 and 15 feet with rolling walker and S. Pt ascended\descended 4" step x 2 with rolling walker and min A, utilizing backwards method to ascend the step. Pt propelled w/c back to room 150 feet with B UEs and S. Pt transferred w/c to edge of bed with S. Pt returned to supine without assistance. Pt requires max encouragement throughout treatment to remain focused on goals and participate with therapy. Throughout treatment pt's focused on the pain of residual limb requiring constant redirection to participate with therapy.     Therapy Documentation Precautions:  Precautions Precautions: Fall Restrictions Weight Bearing Restrictions: Yes RLE Weight Bearing: Non weight bearing General:   Pain: Pt c/o pain 8/10 in residual limb.   Locomotion : Ambulation Ambulation/Gait Assistance: 5: Supervision    See FIM for current functional status  Therapy/Group: Individual Therapy  Rayford Halsted 07/05/2013, 12:40 PM

## 2013-07-05 NOTE — Progress Notes (Signed)
Occupational Therapy Session Note  Patient Details  Name: [name] [name] MRN: [id number] Date of Birth: 1956/05/21  Today's Date: 07/05/2013 Time: 0915-1000 Time Calculation (min): 45 min  Skilled Therapeutic Interventions/Progress Updates:c/o 8/10 pain in right surgical area.  RN had given pain meds approx 15 min before OT arrival per patient report.  Bed to w/c transfer with S; ADL in w/c at sink with focus on activity tolerance in spite of pain and sit to stand and dynamic standing balance for periarea and pulling up pants.   Patient complained of pain throughout the session, only pausing for a few seconds when needed and kept on working for independence for self care.      Therapy Documentation Precautions:  Precautions Precautions: Fall Restrictions Weight Bearing Restrictions: Yes RLE Weight Bearing: Non weight bearing Pain: Pain Assessment Pain Assessment: 0-10 Pain Score: 8  Pain Type: Surgical pain Pain Location: Leg Pain Orientation: Right Pain Descriptors / Indicators: Aching Pain Frequency: Constant Pain Intervention(s): Medication (See eMAR)   See FIM for current functional status  Therapy/Group: Individual Therapy  Bud Face [facility] 07/05/2013, 10:56 AM

## 2013-07-05 NOTE — Progress Notes (Signed)
Occupational Therapy Session Note  Patient Details  Name: [name] MRN: [id number] Date of Birth: April 02, 1956  Today's Date: 07/05/2013 Time: 1400-1430 Time Calculation (min): 30 min  Skilled Therapeutic Interventions/Progress Updates: PM treatment patient completed UE SCIFIT bike for endurance and UE strengthening to ease transfers and standing balance for self care.  Patient c/o pain but very positive and humorous and kept active during the session.     Therapy Documentation Precautions:  Precautions Precautions: Fall Restrictions Weight Bearing Restrictions: Yes RLE Weight Bearing: Non weight bearing   Pain: 6/10  Constant in R surgical area  See FIM for current functional status  Therapy/Group: Individual Therapy  Bud Face [facility] 07/05/2013, 2:14 PM

## 2013-07-05 NOTE — Plan of Care (Signed)
Problem: RH PAIN MANAGEMENT Goal: RH STG PAIN MANAGED AT OR BELOW PT'S PAIN GOAL 5 or less on scale 0-10  Outcome: Not Progressing Pt. Has ongoing pain >5.  Managed with PRN medication.

## 2013-07-05 NOTE — Progress Notes (Signed)
ANTICOAGULATION CONSULT NOTE - Follow Up Consult  Pharmacy Consult for coumadin Indication: hx of severe PVD  No Known Allergies  Patient Measurements: Height: 5\' 6"  (167.6 cm) Weight: 152 lb 12.8 oz (69.31 kg) IBW/kg (Calculated) : 59.3 Heparin Dosing Weight:   Vital Signs: Temp: 98.9 F (37.2 C) (09/13 0606) Temp src: Oral (09/13 0606) BP: 126/74 mmHg (09/13 0606) Pulse Rate: 94 (09/13 0606)  Labs:  Recent Labs  07/03/13 0530 07/04/13 0730 07/05/13 0525  LABPROT 21.9* 21.6* 22.8*  INR 1.98* 1.94* 2.09*    Estimated Creatinine Clearance: 50.4 ml/min (by C-G formula based on Cr of 1.14).   Medications:  Scheduled:  . allopurinol  100 mg Oral Daily  . amitriptyline  50 mg Oral QHS  . atorvastatin  40 mg Oral q1800  . DULoxetine  60 mg Oral Daily  . insulin aspart  0-9 Units Subcutaneous TID WC  . methocarbamol  500 mg Oral QID  . oxyCODONE      . OxyCODONE  20 mg Oral Q12H  . pantoprazole  40 mg Oral BID  . pregabalin  300 mg Oral BID  . senna-docusate  1 tablet Oral BID  . Warfarin - Pharmacist Dosing Inpatient   Does not apply q1800   Infusions:    Assessment: 57 yo female with history of severe PVD on coumadin. INR (2.09) is therapeutic.  Patient has history of GI bleed, EGD on 8/30 - no source of significant GIB. Pt was on levaquin 8/8-8/10. No new cbc today, no bleeding reported per chart.  Goal of Therapy:  INR 2-3 Monitor platelets by anticoagulation protocol: Yes   Plan:  - Coumadin 5mg  po x1 - INR in am  Mickeal Skinner 07/05/2013,7:52 AM

## 2013-07-05 NOTE — Progress Notes (Signed)
Physical Therapy Session Note  Patient Details  Name: [name] [name] MRN: [id number] Date of Birth: 15-Apr-1956  Today's Date: 07/05/2013 Time: 1300-1400 Time Calculation (min): 60 min  Short Term Goals: Week 1:  PT Short Term Goal 1 (Week 1): = LTGs  Skilled Therapeutic Interventions/Progress Updates:    Pt received sitting in w/c for group therapy session with 1 other pt for Orthopedic Exercise group. Performed B LE hip flexion, knee ext and abduction. L LE theraband for DF/PF and IV/EV. Standing heel raise and mini squat 2 x 10 reps.  B UE strengthening with orange theraband Pt displayed pain behaviors throughout treatment session with grimacing and grabbing R limb. Pt did however agree to continue with therapy.   Therapy Documentation Precautions:  Precautions Precautions: Fall Restrictions Weight Bearing Restrictions: Yes RLE Weight Bearing: Non weight bearing       Pain: Pain Assessment Pain Assessment: 0-10 Pain Score: 4  Faces Pain Scale: Hurts little more Pain Type: Surgical pain Pain Location: Leg Pain Orientation: Right Pain Descriptors / Indicators: Aching Pain Frequency: Constant Pain Intervention(s): Repositioned;Emotional support    Locomotion : Ambulation Ambulation/Gait Assistance: 5: Supervision             See FIM for current functional status  Therapy/Group: Group Therapy  Jackelyn Knife 07/05/2013, 2:06 PM

## 2013-07-06 ENCOUNTER — Inpatient Hospital Stay (HOSPITAL_COMMUNITY): Payer: PRIVATE HEALTH INSURANCE | Admitting: *Deleted

## 2013-07-06 LAB — GLUCOSE, CAPILLARY: Glucose-Capillary: 122 mg/dL — ABNORMAL HIGH (ref 70–99)

## 2013-07-06 LAB — PROTIME-INR
INR: 2.34 — ABNORMAL HIGH (ref 0.00–1.49)
Prothrombin Time: 24.9 seconds — ABNORMAL HIGH (ref 11.6–15.2)

## 2013-07-06 MED ORDER — WARFARIN SODIUM 4 MG PO TABS
4.0000 mg | ORAL_TABLET | Freq: Once | ORAL | Status: AC
  Administered 2013-07-06: 4 mg via ORAL
  Filled 2013-07-06 (×2): qty 1

## 2013-07-06 NOTE — Progress Notes (Signed)
ANTICOAGULATION CONSULT NOTE - Follow Up Consult  Pharmacy Consult for coumadin Indication: hx of severe PVD  No Known Allergies  Patient Measurements: Height: 5\' 6"  (167.6 cm) Weight: 155 lb 3.2 oz (70.398 kg) IBW/kg (Calculated) : 59.3   Vital Signs: Temp: 98.2 F (36.8 C) (09/14 0610) Temp src: Oral (09/14 0610) BP: 127/73 mmHg (09/14 0610) Pulse Rate: 5 (09/14 0610)  Labs:  Recent Labs  07/04/13 0730 07/05/13 0525 07/06/13 0600  LABPROT 21.6* 22.8* 24.9*  INR 1.94* 2.09* 2.34*    Estimated Creatinine Clearance: 50.4 ml/min (by C-G formula based on Cr of 1.14).   Assessment: 57 yo female with history of severe PVD on coumadin. INR (2.34) is therapeutic.  Patient has history of GI bleed, EGD on 8/30 - no source of significant GIB. Pt was on levaquin 9/8-9/10. No new cbc today, no bleeding reported per chart.  Goal of Therapy:  INR 2-3 Monitor platelets by anticoagulation protocol: Yes   Plan:  - Coumadin 4mg  po x1 - INR in am  [name] [name] 07/06/2013,7:51 AM

## 2013-07-06 NOTE — Progress Notes (Signed)
Occupational Therapy Note  Patient Details  Name: [name] [name] MRN: [id number] Date of Birth: 03-05-1956 Today's Date: 07/06/2013 Time:  1600-1700 60  min) Pain: right leg pain 7/10 Individual session  Engaged in transfers standing balance, UE AROM and strength, wc mobility.  Pt. Transferred to toilet with close supervision and minimal cues for wc set up in proper angle.  Pt was set up with peri care.  Used arm scifit for 10 minutes at 5 workload with 1# weights on each wrist; (did  Forward and backward)  Transfer to mat and did right knee stretches in supine and prone.  Transferred back to wc and propelled back to room.  Left pt in wc with call bell,phone within reach.        Humberto Seals 07/06/2013, 4:09 PM

## 2013-07-06 NOTE — Progress Notes (Signed)
[name] [name] is a 57 y.o. female July 03, 1956 [id number]  Subjective: No new complaints. No new problems. Slept well. Feeling OK.  Objective: Vital signs in last 24 hours: Temp:  [98.2 F (36.8 C)-98.6 F (37 C)] 98.2 F (36.8 C) (09/14 0610) Pulse Rate:  [5-90] 5 (09/14 0610) Resp:  [18-19] 19 (09/14 0610) BP: (97-127)/(67-73) 127/73 mmHg (09/14 0610) SpO2:  [90 %-96 %] 96 % (09/14 0610) Weight:  [155 lb 3.2 oz (70.398 kg)] 155 lb 3.2 oz (70.398 kg) (09/14 0610) Weight change: 2 lb 6.4 oz (1.089 kg) Last BM Date: 07/05/13  Intake/Output from previous day: 09/13 0701 - 09/14 0700 In: 1080 [P.O.:1080] Out: -  Last cbgs: CBG (last 3)   Recent Labs  07/05/13 1626 07/05/13 2102 07/06/13 0709  GLUCAP 156* 141* 102*     Physical Exam General: No apparent distress    HEENT: moist mucosa Lungs: Normal effort. Lungs clear to auscultation, no crackles or wheezes. Cardiovascular: Regular rate and rhythm, no edema Musculoskeletal:  No change from before Neurological: No new neurological deficits Wounds: R leg stump is dressed Skin: clear Alert, cooperative   Lab Results: BMET    Component Value Date/Time   NA 137 07/02/2013 0535   K 3.7 07/02/2013 0535   CL 102 07/02/2013 0535   CO2 25 07/02/2013 0535   GLUCOSE 85 07/02/2013 0535   BUN 13 07/02/2013 0535   CREATININE 1.14* 07/02/2013 0535   CALCIUM 9.3 07/02/2013 0535   GFRNONAA 52* 07/02/2013 0535   GFRAA 60* 07/02/2013 0535   CBC    Component Value Date/Time   WBC 11.3* 07/02/2013 0535   RBC 3.75* 07/02/2013 0535   HGB 10.8* 07/02/2013 0535   HCT 31.9* 07/02/2013 0535   PLT 607* 07/02/2013 0535   MCV 85.1 07/02/2013 0535   MCH 28.8 07/02/2013 0535   MCHC 33.9 07/02/2013 0535   RDW 16.2* 07/02/2013 0535   LYMPHSABS 2.1 07/02/2013 0535   MONOABS 0.9 07/02/2013 0535   EOSABS 0.2 07/02/2013 0535   BASOSABS 0.0 07/02/2013 0535    Studies/Results: No results found.  Medications: I have reviewed the patient's current  medications.  Assessment/Plan:  1. PVD/ RLE thrombectomy: Pharmaceutical: Coumadin  2. Pain Management: will likely need adjustment in medication for better management.  -increase oxycontin to 20mg  q12 for better pain control  -lyrica for phantom limb pain as well  -stump protection, edema control  3. Mood: Motivated to get better and home. In good spirits at present  4. Neuropsych: This patient is capable of making decisions on her own behalf.  5. DM type 2: will monitor with ac/hs cbg checks. Resumed sitagliptin and use SSI for elevated BS.  6. LLL infiltrate with persistent leucocytosis: Vanc/Zosyn 9/3-9/8--changed to Levaquin on 09/08 with recommendations for 7 total days of antibiotic therapy. --completed. Still with leukocytosis today---follow clinically. Recent urine cx negative  7. Peripheral neuropathy: Will continue Lyrica, Cymbalta and elavil. Pt is alert  8. Dyslipidemia:Will continue lipitor.  9. Ongoing tobacco abuse: plans on quitting. Does not want to try nicotine patch at this time.  10. ABLA: Will continue to monitor H/H with serial checks. Stool OB  11. HTN: Will monitor with bid checks. Off medications at this time.  12. Constipation: increased senokot s, prn meds available - better.  Cont RX     Length of stay, days: 5  Sonda Primes , MD 07/06/2013, 8:34 AM

## 2013-07-07 ENCOUNTER — Inpatient Hospital Stay (HOSPITAL_COMMUNITY): Payer: PRIVATE HEALTH INSURANCE | Admitting: Occupational Therapy

## 2013-07-07 ENCOUNTER — Inpatient Hospital Stay (HOSPITAL_COMMUNITY): Payer: PRIVATE HEALTH INSURANCE

## 2013-07-07 ENCOUNTER — Inpatient Hospital Stay (HOSPITAL_COMMUNITY): Payer: PRIVATE HEALTH INSURANCE | Admitting: Physical Therapy

## 2013-07-07 DIAGNOSIS — S88119A Complete traumatic amputation at level between knee and ankle, unspecified lower leg, initial encounter: Secondary | ICD-10-CM

## 2013-07-07 DIAGNOSIS — I739 Peripheral vascular disease, unspecified: Secondary | ICD-10-CM

## 2013-07-07 DIAGNOSIS — L98499 Non-pressure chronic ulcer of skin of other sites with unspecified severity: Secondary | ICD-10-CM

## 2013-07-07 LAB — PROTIME-INR
INR: 2.48 — ABNORMAL HIGH (ref 0.00–1.49)
Prothrombin Time: 26 seconds — ABNORMAL HIGH (ref 11.6–15.2)

## 2013-07-07 LAB — GLUCOSE, CAPILLARY
Glucose-Capillary: 106 mg/dL — ABNORMAL HIGH (ref 70–99)
Glucose-Capillary: 200 mg/dL — ABNORMAL HIGH (ref 70–99)
Glucose-Capillary: 180 mg/dL — ABNORMAL HIGH (ref 70–99)

## 2013-07-07 MED ORDER — WARFARIN SODIUM 4 MG PO TABS
4.0000 mg | ORAL_TABLET | Freq: Once | ORAL | Status: AC
  Administered 2013-07-07: 4 mg via ORAL
  Filled 2013-07-07: qty 1

## 2013-07-07 NOTE — Progress Notes (Signed)
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    Avanish Cerullo,JIM 07/07/2013, 2:11 PM

## 2013-07-07 NOTE — Progress Notes (Signed)
Occupational Therapy Session Note  Patient Details  Name: [name] [name] MRN: [id number] Date of Birth: 03-09-1956  Today's Date: 07/07/2013 Time: 0800-0900 Time Calculation (min): 60 min  Short Term Goals: Week 1:  OT Short Term Goal 1 (Week 1): Patient will complete bathing, seated, in standard tub using appropriate device, with supervision OT Short Term Goal 2 (Week 1): Patient will complete dressing, sitting and standing, with LRAD independently (Mod I) OT Short Term Goal 3 (Week 1): Patient will complete selected light homemaking task at w/c level with supervision OT Short Term Goal 4 (Week 1): Patient will complete grooming and hygiene at sink, in w/c, independently (Mod I).  Skilled Therapeutic Interventions/Progress Updates:  Patient resting in bed upon arrival and reports that she has been inspecting her incision while waiting on the RN to place a dressing on her right residual limb.  Patient reports that there is another spot on her incision and the bleeding is more excessive today.  Engaged in self care retraining to include sponge bath at sink and dressing EOB.  Focused session on squat pivot transfers, sit><stand, standing balance with one arm supporting herself during peri care and pulling up pants.  After RN completed dressing change and ace wrap, patient propelled to gym and performed a stand step transfer with walker w/c>elevated mat-her bed at him is very high (patient will ask family to measure height for more accuracy during training). BUE exercises performed in supine with 2# dumbells for bicep and tricep exercises and 3# bar for chest presses.  Patient performed 2 sets of 12 of a total of 4 exercises..  Mat>w/c with squat pivot secondary to patient plan to propel to family room to get some coffee before her next therapy.  Therapy Documentation Precautions:  Precautions Precautions: Fall Restrictions Weight Bearing Restrictions: Yes RLE Weight Bearing: Non weight  bearing Pain: 12/10 right residual limb, RN aware and medication and wrapping provided, activity, rest repositioned. ADL: See FIM for current functional status  Therapy/Group: Individual Therapy  Jasmond River 07/07/2013, 11:07 AM

## 2013-07-07 NOTE — Progress Notes (Signed)
Occupational Therapy Session Note  Patient Details  Name: [name] [name] MRN: [id number] Date of Birth: October 07, 1956  Today's Date: 07/07/2013 Time: 4098-1191 Time Calculation (min): 43 min   Skilled Therapeutic Interventions/Progress Updates:    Pt worked no wheelchair mobility in the room in order to remove her bed sheets and place new ones on.  Pt initially attempted to remove sheets using the RW but was having too much pain in the RLE when attempting to stand.  Pt overall supervision level for task.  Needed min instructional cueing to lock the brakes on the wheelchair when attempting to reach down to the floor to pick up dirty sheets or way forward to apply the sheet to the corner of the mattress.  Pt noted keeping knee flexed and therapist re-emphasized the need for her to keep the knee as straight as possible.  Also educated her on not using a pillow under the knee as this promotes knee flexion.  Therapist discussed the need for a built up surface on the amputee pad with incoming PT and pt as well.    Therapy Documentation Precautions:  Precautions Precautions: Fall Restrictions Weight Bearing Restrictions: No RLE Weight Bearing: Non weight bearing  Pain: Pain Assessment Pain Assessment: Faces Pain Score: 10-Worst pain ever Faces Pain Scale: Hurts even more Pain Type: Surgical pain Pain Location: Leg Pain Orientation: Right Pain Descriptors / Indicators: Aching Pain Frequency: Intermittent Pain Onset: Gradual Pain Intervention(s): RN made aware;Repositioned ADL: See FIM for current functional status  Therapy/Group: Individual Therapy  Davina Howlett OTR/L 07/07/2013, 2:37 PM

## 2013-07-07 NOTE — Progress Notes (Signed)
Physical Therapy Session Note  Patient Details  Name: [name] [name] MRN: [id number] Date of Birth: Jun 07, 1956  Today's Date: 07/07/2013 Time: 0930-1030 Time Calculation (min): 60 min  Short Term Goals: Week 1:  PT Short Term Goal 1 (Week 1): = LTGs  Skilled Therapeutic Interventions/Progress Updates:    Session with heavy focus on d/c planning to problem solve, simulate and practice home entry. Pt now believes there are actually 2 steps to get up onto the porch and then 1 more into the house. Attempted with RW going up backwards, but required up to mod A due to shooting pains in residual limb causing her to have LOB and difficulty bringing RW up onto the step. Discussed alternative option to bump up on bottom and complete floor transfer into the w/c once inside the house. Pt able to transfer to the steps with min A and bump up/down. Floor transfer with min A with step up block between ground and w/c to lessen the distance - pt reports she has something like that at home that she could use. At this time feel that bumping up stairs on bottom would be safer option for pt until she feels more confident with use of RW and pain is better managed. Continues to required encouragement throughout session to continue to participate and discussed main goal at this time is to work on Office manager. Plans to have family member, [name], come in to practice stairs this week.  Also practiced simulated car transfer in preparation for d/c home with S; pt plans to use public transportation with Zenaida Niece lift other than transport from hospital. Gait with RW with S x 20' with cues for safety and unable to do further due to pain. Will downgrade gait distance goal at this time as pt states she plans to use w/c mainly in house until pain is better.  Therapy Documentation Precautions:  Precautions Precautions: Fall Restrictions Weight Bearing Restrictions: Yes RLE Weight Bearing: Non weight bearing    Pain: Pain Assessment Pain Assessment: 0-10 Pain Score: 10-Worst pain ever Pain Type: Surgical pain Pain Location: Leg Pain Orientation: Right Pain Descriptors / Indicators: Aching Pain Frequency: Intermittent Pain Onset: Gradual Patients Stated Pain Goal: 4 Pain Intervention(s): Medication (See eMAR)   Locomotion : Ambulation Ambulation/Gait Assistance: 5: Supervision   See FIM for current functional status  Therapy/Group: Individual Therapy  Karolee Stamps [place] 07/07/2013, 11:49 AM

## 2013-07-07 NOTE — Progress Notes (Signed)
ANTICOAGULATION CONSULT NOTE - Follow Up Consult  Pharmacy Consult for coumadin Indication: hx of severe PVD  No Known Allergies  Patient Measurements: Height: 5\' 6"  (167.6 cm) Weight: 158 lb 11.7 oz (72 kg) IBW/kg (Calculated) : 59.3   Vital Signs: Temp: 98.6 F (37 C) (09/15 0500) Temp src: Oral (09/15 0500) BP: 102/68 mmHg (09/15 0500) Pulse Rate: 82 (09/15 0500)  Labs:  Recent Labs  07/05/13 0525 07/06/13 0600 07/07/13 0605  LABPROT 22.8* 24.9* 26.0*  INR 2.09* 2.34* 2.48*    Estimated Creatinine Clearance: 54.7 ml/min (by C-G formula based on Cr of 1.14).   Assessment: 57 yo female with history of severe PVD on coumadin. INR (2.48) is therapeutic.  Patient has history of GI bleed, EGD on 8/30 - no source of significant GIB. Pt was on levaquin 9/8-9/10. No new cbc today, no bleeding reported per chart.  Goal of Therapy:  INR 2-3 Monitor platelets by anticoagulation protocol: Yes   Plan:  - Coumadin 4mg  po x1 - INR in am  Riki Rusk 07/07/2013,11:23 AM

## 2013-07-07 NOTE — Progress Notes (Signed)
Subjective/Complaints: Right leg still tender. No fever/chills A 12 point review of systems has been performed and if not noted above is otherwise negative.   Objective: Vital Signs: Blood pressure 102/68, pulse 82, temperature 98.6 F (37 C), temperature source Oral, resp. rate 18, height 5\' 6"  (1.676 m), weight 72 kg (158 lb 11.7 oz), SpO2 94.00%. No results found. No results found for this basename: WBC, HGB, HCT, PLT,  in the last 72 hours No results found for this basename: NA, K, CL, CO, GLUCOSE, BUN, CREATININE, CALCIUM,  in the last 72 hours CBG (last 3)   Recent Labs  07/06/13 1634 07/06/13 2045 07/07/13 0737  GLUCAP 113* 122* 106*    Wt Readings from Last 3 Encounters:  07/07/13 72 kg (158 lb 11.7 oz)  07/01/13 72 kg (158 lb 11.7 oz)  07/01/13 72 kg (158 lb 11.7 oz)    Physical Exam:  Constitutional: She is oriented to person, place, and time. She appears well-developed and well-nourished.  HENT:  Head: Normocephalic and atraumatic.  Poor dentition.  Eyes: Conjunctivae are normal. Pupils are equal, round, and reactive to light.  Neck: Normal range of motion.  Cardiovascular: Normal rate and regular rhythm.  Pulmonary/Chest: Effort normal and breath sounds normal. No respiratory distress. She has no wheezes.  Abdominal: Soft. Bowel sounds are normal. She exhibits no distension. There is no tenderness.  Musculoskeletal: right stump tender, well shaped, edema reduced Neurological: She is alert and oriented to person, place, and time. Intact insight and awareness. Motor strength is 5/5 bilateral deltoid, bicep, tricep, grip Right hip flexor 2 /5---Left hip flexor 5/5 left knee extensor ankle dorsiflexor plantar flexor 5/5 Sensory exam is normal. Good sitting balance  Skin: Skin is warm and dry. Wound clean and intact with moderate serosanginous drainage from mid/lateral areas   Assessment/Plan: 1. Functional deficits secondary to right BKA which require 3+ hours  per day of interdisciplinary therapy in a comprehensive inpatient rehab setting. Physiatrist is providing close team supervision and 24 hour management of active medical problems listed below. Physiatrist and rehab team continue to assess barriers to discharge/monitor patient progress toward functional and medical goals. FIM: FIM - Bathing Bathing Steps Patient Completed: Chest;Right Arm;Left Arm;Abdomen;Front perineal area;Buttocks;Right upper leg;Left upper leg;Left lower leg (including foot) Bathing: 5: Supervision: Safety issues/verbal cues  FIM - Upper Body Dressing/Undressing Upper body dressing/undressing steps patient completed: Thread/unthread right bra strap;Thread/unthread left bra strap;Hook/unhook bra;Thread/unthread right sleeve of pullover shirt/dresss;Pull shirt over trunk;Put head through opening of pull over shirt/dress;Thread/unthread left sleeve of pullover shirt/dress Upper body dressing/undressing: 5: Set-up assist to: Obtain clothing/put away FIM - Lower Body Dressing/Undressing Lower body dressing/undressing steps patient completed: Thread/unthread right underwear leg;Thread/unthread left underwear leg;Fasten/unfasten pants;Don/Doff left sock;Pull underwear up/down;Thread/unthread right pants leg;Thread/unthread left pants leg;Pull pants up/down Lower body dressing/undressing: 5: Supervision: Safety issues/verbal cues  FIM - Toileting Toileting steps completed by patient: Adjust clothing prior to toileting;Performs perineal hygiene;Adjust clothing after toileting Toileting Assistive Devices: Grab bar or rail for support Toileting: 6: More than reasonable amount of time  FIM - Diplomatic Services operational officer Devices: Grab bars Toilet Transfers: 4-To toilet/BSC: Min A (steadying Pt. > 75%);4-From toilet/BSC: Min A (steadying Pt. > 75%)  FIM - Bed/Chair Transfer Bed/Chair Transfer Assistive Devices: Bed rails;Arm rests Bed/Chair Transfer: 7: Supine > Sit: No  assist;5: Bed > Chair or W/C: Supervision (verbal cues/safety issues)  FIM - Locomotion: Wheelchair Locomotion: Wheelchair: 5: Travels 150 ft or more: maneuvers on rugs and over door sills with  supervision, cueing or coaxing FIM - Locomotion: Ambulation Locomotion: Ambulation Assistive Devices: Walker - Rolling Ambulation/Gait Assistance: 5: Supervision Locomotion: Ambulation: 1: Travels less than 50 ft with supervision/safety issues  Comprehension Comprehension Mode: Auditory Comprehension: 7-Follows complex conversation/direction: With no assist  Expression Expression Mode: Verbal Expression: 7-Expresses complex ideas: With no assist  Social Interaction Social Interaction: 6-Interacts appropriately with others with medication or extra time (anti-anxiety, antidepressant).  Problem Solving Problem Solving: 6-Solves complex problems: With extra time  Memory Memory: 7-Complete Independence: No helper Medical Problem List and Plan:  1. PVD/ RLE thrombectomy: Pharmaceutical: Coumadin  2. Pain Management: will likely need adjustment in medication for better management.   -increased oxycontin to 20mg  q12 for better pain control  -lyrica for phantom limb pain as well    -stump protection, edema control 3. Mood: Motivated to get better and home. In good spirits at present 4. Neuropsych: This patient is capable of making decisions on her own behalf.  5. DM type 2: will monitor with ac/hs cbg checks. Resumed sitagliptin SSI, sugars controlled.  6. LLL infiltrate/ID: Vanc/Zosyn 9/3-9/8--changed to Levaquin on 09/08 with recommendations for 7 total days of antibiotic therapy. --completed.    -recheck cbc tomorrow  -wound appears appropriate--continue current dressing 7. Peripheral neuropathy: Will continue Lyrica, Cymbalta and elavil. Pt is alert  8. Dyslipidemia:Will continue lipitor.  9. Ongoing tobacco abuse: plans on quitting. Does not want to try nicotine patch at this time.  10.  ABLA: recheck tomorrow 11. HTN: Will monitor with bid checks. Off medications at this time.  12. Constipation: increased senokot s, prn meds available   LOS (Days) 6 A FACE TO FACE EVALUATION WAS PERFORMED  SWARTZ,ZACHARY T 07/07/2013 7:44 AM

## 2013-07-08 ENCOUNTER — Inpatient Hospital Stay (HOSPITAL_COMMUNITY): Payer: PRIVATE HEALTH INSURANCE

## 2013-07-08 ENCOUNTER — Encounter (HOSPITAL_COMMUNITY): Payer: PRIVATE HEALTH INSURANCE | Admitting: Occupational Therapy

## 2013-07-08 ENCOUNTER — Inpatient Hospital Stay (HOSPITAL_COMMUNITY): Payer: PRIVATE HEALTH INSURANCE | Admitting: Rehabilitation

## 2013-07-08 ENCOUNTER — Inpatient Hospital Stay (HOSPITAL_COMMUNITY): Payer: PRIVATE HEALTH INSURANCE | Admitting: Occupational Therapy

## 2013-07-08 DIAGNOSIS — S88119A Complete traumatic amputation at level between knee and ankle, unspecified lower leg, initial encounter: Secondary | ICD-10-CM

## 2013-07-08 DIAGNOSIS — I739 Peripheral vascular disease, unspecified: Secondary | ICD-10-CM

## 2013-07-08 DIAGNOSIS — L98499 Non-pressure chronic ulcer of skin of other sites with unspecified severity: Secondary | ICD-10-CM

## 2013-07-08 LAB — CBC
HCT: 30.3 % — ABNORMAL LOW (ref 36.0–46.0)
Hemoglobin: 9.7 g/dL — ABNORMAL LOW (ref 12.0–15.0)
MCHC: 32 g/dL (ref 30.0–36.0)
RDW: 16.3 % — ABNORMAL HIGH (ref 11.5–15.5)
WBC: 9 10*3/uL (ref 4.0–10.5)

## 2013-07-08 LAB — PROTIME-INR
INR: 2.81 — ABNORMAL HIGH (ref 0.00–1.49)
Prothrombin Time: 28.6 seconds — ABNORMAL HIGH (ref 11.6–15.2)

## 2013-07-08 LAB — BASIC METABOLIC PANEL
CO2: 27 mEq/L (ref 19–32)
Calcium: 8.6 mg/dL (ref 8.4–10.5)
GFR calc non Af Amer: 63 mL/min — ABNORMAL LOW (ref >90)
Glucose, Bld: 96 mg/dL (ref 70–99)
Potassium: 3.7 mEq/L (ref 3.5–5.1)
Sodium: 142 mEq/L (ref 135–145)

## 2013-07-08 LAB — GLUCOSE, CAPILLARY
Glucose-Capillary: 100 mg/dL — ABNORMAL HIGH (ref 70–99)
Glucose-Capillary: 104 mg/dL — ABNORMAL HIGH (ref 70–99)

## 2013-07-08 MED ORDER — LINAGLIPTIN 5 MG PO TABS
5.0000 mg | ORAL_TABLET | Freq: Every day | ORAL | Status: DC
  Administered 2013-07-08 – 2013-07-11 (×4): 5 mg via ORAL
  Filled 2013-07-08 (×5): qty 1

## 2013-07-08 MED ORDER — WARFARIN SODIUM 3 MG PO TABS
3.0000 mg | ORAL_TABLET | Freq: Once | ORAL | Status: AC
  Administered 2013-07-08: 3 mg via ORAL
  Filled 2013-07-08: qty 1

## 2013-07-08 NOTE — Progress Notes (Signed)
Occupational Therapy Session Note  Patient Details  Name: [name] [name] MRN: [id number] Date of Birth: 1955/11/26  Today's Date: 07/08/2013 Time: 1105-1200 Time Calculation (min): 55 min  Short Term Goals: Week 1:  OT Short Term Goal 1 (Week 1): Patient will complete bathing, seated, in standard tub using appropriate device, with supervision OT Short Term Goal 2 (Week 1): Patient will complete dressing, sitting and standing, with LRAD independently (Mod I) OT Short Term Goal 3 (Week 1): Patient will complete selected light homemaking task at w/c level with supervision OT Short Term Goal 4 (Week 1): Patient will complete grooming and hygiene at sink, in w/c, independently (Mod I).  Skilled Therapeutic Interventions/Progress Updates:  1105-1200 - 33 Minutes Individual Therapy Patient with 4/10 pain when sedentary, and 10/10 during movements and ambulation; RN aware Upon entering room, patient found seated in w/c. Patient propelled self from room -> ADL apartment. In ADL apartment, patient engaged in functional ambulation using rolling walker for bed mobility and tub/shower transfer on/off tub transfer bench.  Patient limited by pain, decreasing independence and causing patient to require supervision for ambulating tasks. After ADL tasks in apartment, patient propelled self -> therapy gym. Patient engaged in BUE strengthening exercises using ergometer for ~ 5 minutes, then transferred onto therapy mat for BUE strengthening exercises using arm weights (5lbs) and dumbbells(2lbs); patient performed 3 sets of 10 reps for 3 differed shoulder and elbow exercises. Patient propelled self back to room and left seated in w/c with call bell & phone within reach.   Therapist and patient discussed bathroom situation at home. Patient stated a walker will not fit in bathroom frontwards or sideways and she used to use her assistive device like a cane PTA. Therapist recommeds patient does not go in bathroom  initially when d/c'd -> home. Recommending patient perform bathing tasks in kitchen using kitchen sink. Also recommend patient use BSC set-up in bedroom. Recommending HHOT to assist patient with ambulatory tasks within house/apartment.   Precautions:  Precautions Precautions: Fall Restrictions Weight Bearing Restrictions: Yes RLE Weight Bearing: Non weight bearing  See FIM for current functional status  Therapy/Group: Individual Therapy  Jenesis Suchy 07/08/2013, 12:07 PM

## 2013-07-08 NOTE — Progress Notes (Signed)
Occupational Therapy Session Note  Patient Details  Name: [name] [name] MRN: [id number] Date of Birth: 1956/04/20  Today's Date: 07/08/2013 Time: 1400-1430 Time Calculation (min): 30 min  Short Term Goals: Week 1:  OT Short Term Goal 1 (Week 1): Patient will complete bathing, seated, in standard tub using appropriate device, with supervision OT Short Term Goal 2 (Week 1): Patient will complete dressing, sitting and standing, with LRAD independently (Mod I) OT Short Term Goal 3 (Week 1): Patient will complete selected light homemaking task at w/c level with supervision OT Short Term Goal 4 (Week 1): Patient will complete grooming and hygiene at sink, in w/c, independently (Mod I).  Skilled Therapeutic Interventions/Progress Updates:  1:1 tx. Pt in w/c in room. Just cleared for Mod (I) txfrs w/c<>bed. Addressed w/c placement/set-up for commode txfr. Pt initially needed visual and verbal cues for w/c placement. Pt able to (I)'ly remove leg rests and set-up w/c w/1 cue 2nd attempt. Once positioned, pt Mod (I) for SPT using grab bars to commode. Pt (I) clothing mgmt & hygiene. Pt able to apply footrests onto w/c w/o verbal or physical assist. Pt stated, "I'd feel better if the nurse helped me one more day before doing that completely on my own." Pt participated in UE therex last half of treatment session.   Therapy Documentation Precautions:  Precautions Precautions: Fall Restrictions Weight Bearing Restrictions: Yes RLE Weight Bearing: Non weight bearing General:   Vital Signs:   Pain: Pain Assessment Pain Assessment: 0-10 Pain Score: 9  Pain Type: Surgical pain Pain Location: Leg Pain Orientation: Right Pain Descriptors / Indicators: Aching Pain Frequency: Intermittent Pain Onset: Gradual Patients Stated Pain Goal: 4 Pain Intervention(s): Medication (See eMAR)  See FIM for current functional status  Therapy/Group: Individual Therapy  Arlisha Patalano, Deidre Ala 07/08/2013, 2:20  PM

## 2013-07-08 NOTE — Progress Notes (Signed)
Physical Therapy Session Note  Patient Details  Name: [name] [name] MRN: [id number] Date of Birth: 1955-12-03  Today's Date: 07/08/2013 Time: 1332-1400 Time Calculation (min): 28 min  Short Term Goals: Week 1:  PT Short Term Goal 1 (Week 1): = LTGs  Skilled Therapeutic Interventions/Progress Updates:    Session focused on stair and floor transfers to simulate home entry; pt required from S to heavy min A for transfers and cues for hand placement and positioning. Self stretching to residual limb on mat for pain relief and propelled w/c mod I on unit. Encouragement provided as pt limited by pain. Made pt mod I in room for bed <-> chair transfers, but S for toilet transfers as pt states she does not feel comfortable with that yet. Nurse tech made aware and sign hung on door.  Therapy Documentation Precautions:  Precautions Precautions: Fall Restrictions Weight Bearing Restrictions: Yes RLE Weight Bearing: Non weight bearing   Pain: Pain Assessment Pain Assessment: 0-10 Pain Score: 6  Pain Type: Surgical pain Pain Location: Leg Pain Orientation: Right Pain Descriptors / Indicators: Aching Pain Frequency: Intermittent Pain Onset: Gradual Patients Stated Pain Goal: 4 Pain Intervention(s): Medication (See eMAR)  See FIM for current functional status  Therapy/Group: Individual Therapy  Karolee Stamps [facility] 07/08/2013, 2:10 PM

## 2013-07-08 NOTE — Progress Notes (Signed)
ANTICOAGULATION CONSULT NOTE - Follow Up Consult  Pharmacy Consult for Coumadin Indication: s/p thrombolysis, fem-pop bypass  No Known Allergies  Patient Measurements: Height: 5\' 6"  (167.6 cm) Weight: 159 lb 6.3 oz (72.3 kg) IBW/kg (Calculated) : 59.3 Heparin Dosing Weight:   Vital Signs: Temp: 98.4 F (36.9 C) (09/16 0618) Temp src: Oral (09/16 0618) BP: 114/72 mmHg (09/16 0618) Pulse Rate: 85 (09/16 0618)  Labs:  Recent Labs  07/06/13 0600 07/07/13 0605 07/08/13 0520  HGB  --   --  9.7*  HCT  --   --  30.3*  PLT  --   --  633*  LABPROT 24.9* 26.0* 28.6*  INR 2.34* 2.48* 2.81*  CREATININE  --   --  0.97    Estimated Creatinine Clearance: 64.4 ml/min (by C-G formula based on Cr of 0.97).   Medications:  Scheduled:  . allopurinol  100 mg Oral Daily  . amitriptyline  50 mg Oral QHS  . atorvastatin  40 mg Oral q1800  . DULoxetine  60 mg Oral Daily  . insulin aspart  0-9 Units Subcutaneous TID WC  . linagliptin  5 mg Oral Daily  . methocarbamol  500 mg Oral QID  . OxyCODONE  20 mg Oral Q12H  . pantoprazole  40 mg Oral BID  . pregabalin  300 mg Oral BID  . senna-docusate  1 tablet Oral BID  . Warfarin - Pharmacist Dosing Inpatient   Does not apply q1800    Assessment: 57yo female with hx of severe PVD, s/p thrombolysis and fem-pop bypass.  INR 2.81 this AM- increased over last 2 days most likely reflective of Coumadin doses of 5mg  a few days ago.  No bleeding problems noted.  Goal of Therapy:  INR 2-3 Monitor platelets by anticoagulation protocol: Yes   Plan:  1.  Coumadin 3mg  2.  F/U INR in AM  Marisue Humble, PharmD Clinical Pharmacist [facility]

## 2013-07-08 NOTE — Progress Notes (Signed)
Subjective/Complaints: Right leg still tender. Pain meds working though A 12 point review of systems has been performed and if not noted above is otherwise negative.   Objective: Vital Signs: Blood pressure 114/72, pulse 85, temperature 98.4 F (36.9 C), temperature source Oral, resp. rate 17, height 5\' 6"  (1.676 m), weight 72.3 kg (159 lb 6.3 oz), SpO2 95.00%. No results found.  Recent Labs  07/08/13 0520  WBC 9.0  HGB 9.7*  HCT 30.3*  PLT 633*    Recent Labs  07/08/13 0520  NA 142  K 3.7  CL 105  GLUCOSE 96  BUN 14  CREATININE 0.97  CALCIUM 8.6   CBG (last 3)   Recent Labs  07/07/13 1703 07/07/13 2035 07/08/13 0719  GLUCAP 200* 180* 100*    Wt Readings from Last 3 Encounters:  07/08/13 72.3 kg (159 lb 6.3 oz)  07/01/13 72 kg (158 lb 11.7 oz)  07/01/13 72 kg (158 lb 11.7 oz)    Physical Exam:  Constitutional: She is oriented to person, place, and time. She appears well-developed and well-nourished.  HENT:  Head: Normocephalic and atraumatic.  Poor dentition.  Eyes: Conjunctivae are normal. Pupils are equal, round, and reactive to light.  Neck: Normal range of motion.  Cardiovascular: Normal rate and regular rhythm.  Pulmonary/Chest: Effort normal and breath sounds normal. No respiratory distress. She has no wheezes.  Abdominal: Soft. Bowel sounds are normal. She exhibits no distension. There is no tenderness.  Musculoskeletal: right stump tender, well shaped, edema reduced Neurological: She is alert and oriented to person, place, and time. Intact insight and awareness. Motor strength is 5/5 bilateral deltoid, bicep, tricep, grip Right hip flexor 3- /5---Left hip flexor 5/5 left knee extensor ankle dorsiflexor plantar flexor 5/5 Sensory exam is normal. Good sitting balance  Skin: Skin is warm and dry. Wound clean and intact with moderate serosanginous drainage from mid/lateral areas   Assessment/Plan: 1. Functional deficits secondary to right BKA  which require 3+ hours per day of interdisciplinary therapy in a comprehensive inpatient rehab setting. Physiatrist is providing close team supervision and 24 hour management of active medical problems listed below. Physiatrist and rehab team continue to assess barriers to discharge/monitor patient progress toward functional and medical goals. FIM: FIM - Bathing Bathing Steps Patient Completed: Chest;Right Arm;Left Arm;Abdomen;Front perineal area;Buttocks;Right upper leg;Left upper leg;Left lower leg (including foot) Bathing: 5: Supervision: Safety issues/verbal cues  FIM - Upper Body Dressing/Undressing Upper body dressing/undressing steps patient completed: Thread/unthread right bra strap;Thread/unthread left bra strap;Hook/unhook bra;Thread/unthread right sleeve of pullover shirt/dresss;Pull shirt over trunk;Put head through opening of pull over shirt/dress;Thread/unthread left sleeve of pullover shirt/dress Upper body dressing/undressing: 5: Set-up assist to: Obtain clothing/put away FIM - Lower Body Dressing/Undressing Lower body dressing/undressing steps patient completed: Thread/unthread right underwear leg;Thread/unthread left underwear leg;Fasten/unfasten pants;Don/Doff left sock;Pull underwear up/down;Thread/unthread right pants leg;Thread/unthread left pants leg;Pull pants up/down Lower body dressing/undressing: 5: Supervision: Safety issues/verbal cues  FIM - Toileting Toileting steps completed by patient: Adjust clothing prior to toileting;Performs perineal hygiene;Adjust clothing after toileting Toileting Assistive Devices: Grab bar or rail for support Toileting: 6: More than reasonable amount of time  FIM - Diplomatic Services operational officer Devices: Grab bars Toilet Transfers: 4-To toilet/BSC: Min A (steadying Pt. > 75%);4-From toilet/BSC: Min A (steadying Pt. > 75%)  FIM - Bed/Chair Transfer Bed/Chair Transfer Assistive Devices: Arm rests Bed/Chair Transfer: 7:  Supine > Sit: No assist;7: Sit > Supine: No assist;5: Bed > Chair or W/C: Supervision (verbal cues/safety issues);5: Chair or  W/C > Bed: Supervision (verbal cues/safety issues)  FIM - Locomotion: Wheelchair Locomotion: Wheelchair: 6: Travels 150 ft or more, turns around, maneuvers to table, bed or toilet, negotiates 3% grade: maneuvers on rugs and over door sills independently FIM - Locomotion: Ambulation Locomotion: Ambulation Assistive Devices: Designer, industrial/product Ambulation/Gait Assistance: 5: Supervision Locomotion: Ambulation: 1: Travels less than 50 ft with supervision/safety issues  Comprehension Comprehension Mode: Auditory Comprehension: 7-Follows complex conversation/direction: With no assist  Expression Expression Mode: Verbal Expression: 7-Expresses complex ideas: With no assist  Social Interaction Social Interaction: 6-Interacts appropriately with others with medication or extra time (anti-anxiety, antidepressant).  Problem Solving Problem Solving: 6-Solves complex problems: With extra time  Memory Memory: 7-Complete Independence: No helper Medical Problem List and Plan:  1. PVD/ RLE thrombectomy: Pharmaceutical: Coumadin  2. Pain Management: will likely need adjustment in medication for better management.   -increased oxycontin to 20mg  q12 for better pain control  -lyrica for phantom limb pain as well    -stump protection, edema control  -we discussed working through the pain to a degree 3. Mood: Motivated to get better and home. In good spirits at present 4. Neuropsych: This patient is capable of making decisions on her own behalf.  5. DM type 2: will monitor with ac/hs cbg checks. Resumed sitagliptin SSI, sugars controlled.  6. LLL infiltrate/ID: Vanc/Zosyn 9/3-9/8--changed to Levaquin on 09/08 with recommendations for 7 total days of antibiotic therapy. --completed.    -wbc's decreased  -wound appears appropriate--continue current dressing 7. Peripheral neuropathy:  Will continue Lyrica, Cymbalta and elavil. Pt is alert  8. Dyslipidemia:Will continue lipitor.  9. Ongoing tobacco abuse: plans on quitting. Does not want to try nicotine patch at this time.  10. ABLA: still drifting down. Likely post-op. Stool for ob. Recheck hgb tomorrow 11. HTN: Will monitor with bid checks. Off medications at this time.  12. Constipation: increased senokot s, prn meds   LOS (Days) 7 A FACE TO FACE EVALUATION WAS PERFORMED  SWARTZ,ZACHARY T 07/08/2013 7:46 AM

## 2013-07-08 NOTE — Progress Notes (Signed)
Physical Therapy Session Note  Patient Details  Name: [name] [name] MRN: [id number] Date of Birth: 10-15-1956  Today's Date: 07/08/2013 Time: 1430-1500 Time Calculation (min): 30 min  Short Term Goals: Week 1:  PT Short Term Goal 1 (Week 1): = LTGs  Skilled Therapeutic Interventions/Progress Updates:   Pt received in w/c in gym.  Requires mod encouragement from therapist to participate due to pain, however was more agreeable when told she could perform mat level exercises.  Performed supine SLR x 10 reps RLE, glute sets x 10 reps bilaterally, LLE bridging x 10 reps, SL RLE hip abd x 10 reps with min cues for technique, and SL RLE hip ext x 10 reps.  Also performed 5 reps sit <> stand with focus on attaining upright posture and balance once standing.  She was unable to stand for more than approx 15-20 secs without needing to sit again.  Pt performed w/c mobility x 100' x1 at supervision level.  Pt assisted back to room with all needs in place.  Pt got coffee in day room.  Notified RN and RN states okay, however she will not get ice cream later.  Pt in agreement.  Also notified RN of sugar packets in pts shirt pocket for safety.    Therapy Documentation Precautions:  Precautions Precautions: Fall Restrictions Weight Bearing Restrictions: Yes RLE Weight Bearing: Non weight bearing   Vital Signs: Therapy Vitals Temp: 98.9 F (37.2 C) Temp src: Oral Pulse Rate: 76 Resp: 17 BP: 119/70 mmHg Patient Position, if appropriate: Sitting Oxygen Therapy SpO2: 92 % O2 Device: None (Room air) Pain: Pain Assessment Pain Assessment: 0-10 Pain Score: 9  Pain Type: Surgical pain Pain Location: Leg Pain Orientation: Right Pain Descriptors / Indicators: Aching  See FIM for current functional status  Therapy/Group: Individual Therapy  Vista Deck 07/08/2013, 4:25 PM

## 2013-07-08 NOTE — Progress Notes (Signed)
Occupational Therapy Session Note  Patient Details  Name: [name] [name] MRN: [id number] Date of Birth: [birth year]  Today's Date: 07/08/2013 Time: 0905-0950 Time Calculation (min): 45 min  Short Term Goals: Week 1:  OT Short Term Goal 1 (Week 1): Patient will complete bathing, seated, in standard tub using appropriate device, with supervision OT Short Term Goal 2 (Week 1): Patient will complete dressing, sitting and standing, with LRAD independently (Mod I) OT Short Term Goal 3 (Week 1): Patient will complete selected light homemaking task at w/c level with supervision OT Short Term Goal 4 (Week 1): Patient will complete grooming and hygiene at sink, in w/c, independently (Mod I).  Skilled Therapeutic Interventions/Progress Updates:      Pt seen for BADL retraining of toileting, bathing, and dressing with a focus on completing all tasks at a mod I level from w/c level. Pt did well retrieving her clothing and completing all tasks safely.  Pt states that she feels ready to go home.  Pt would benefit from further training with RW to access bathroom. Pt resting in chair at end of session with call light in reach.  Therapy Documentation Precautions:  Precautions Precautions: Fall Restrictions Weight Bearing Restrictions: Yes RLE Weight Bearing: Non weight bearing    Vital Signs: Therapy Vitals Temp: 98.4 F (36.9 C) Temp src: Oral Pulse Rate: 85 Resp: 17 BP: 114/72 mmHg Patient Position, if appropriate: Lying Oxygen Therapy SpO2: 95 % Pain: Pain Assessment Pain Assessment: 0-10 Pain Score: 8  Pain Type: Surgical pain Pain Location: Leg Pain Orientation: Right Pain Descriptors / Indicators: Aching Pain Frequency: Intermittent Pain Onset: Gradual Patients Stated Pain Goal: 4 Pain Intervention(s): Other (Comment) (pre- medicated ) ADL:   See FIM for current functional status  Therapy/Group: Individual Therapy  SAGUIER,JULIA 07/08/2013, 10:10 AM

## 2013-07-09 ENCOUNTER — Inpatient Hospital Stay (HOSPITAL_COMMUNITY): Payer: PRIVATE HEALTH INSURANCE | Admitting: Occupational Therapy

## 2013-07-09 ENCOUNTER — Inpatient Hospital Stay (HOSPITAL_COMMUNITY): Payer: PRIVATE HEALTH INSURANCE

## 2013-07-09 LAB — GLUCOSE, CAPILLARY
Glucose-Capillary: 89 mg/dL (ref 70–99)
Glucose-Capillary: 90 mg/dL (ref 70–99)
Glucose-Capillary: 106 mg/dL — ABNORMAL HIGH (ref 70–99)

## 2013-07-09 LAB — CBC
HCT: 30.8 % — ABNORMAL LOW (ref 36.0–46.0)
Hemoglobin: 9.7 g/dL — ABNORMAL LOW (ref 12.0–15.0)
RBC: 3.55 MIL/uL — ABNORMAL LOW (ref 3.87–5.11)

## 2013-07-09 LAB — PROTIME-INR
INR: 2.77 — ABNORMAL HIGH (ref 0.00–1.49)
Prothrombin Time: 28.3 seconds — ABNORMAL HIGH (ref 11.6–15.2)

## 2013-07-09 MED ORDER — WARFARIN SODIUM 3 MG PO TABS
3.0000 mg | ORAL_TABLET | Freq: Every day | ORAL | Status: DC
  Administered 2013-07-09 – 2013-07-10 (×2): 3 mg via ORAL
  Filled 2013-07-09 (×3): qty 1

## 2013-07-09 NOTE — Progress Notes (Signed)
Physical Therapy Session Note  Patient Details  Name: [name] [name] MRN: [id number] Date of Birth: 02/19/1956  Today's Date: 07/09/2013 Time: 4098-1191 Time Calculation (min): 40 min  Short Term Goals: Week 1:  PT Short Term Goal 1 (Week 1): = LTGs  Skilled Therapeutic Interventions/Progress Updates:   Session focused on gait training to tolerance with RW (S), floor transfers and stair negotiation on bottom (S to steady A), and supine therex including SLR, hip abduction, SAQ, and modified bridging x 10 reps each. Limited by pain.  Therapy Documentation Precautions:  Precautions Precautions: Fall Restrictions Weight Bearing Restrictions: Yes RLE Weight Bearing: Non weight bearing   Pain: Pain Assessment Pain Assessment: 0-10 Pain Score: Asleep Pain Type: Surgical pain Pain Location: Leg Pain Orientation: Right Pain Descriptors / Indicators: Aching;Constant Pain Frequency: Constant Pain Onset: On-going Patients Stated Pain Goal: 5 Pain Intervention(s): Medication (See eMAR);Repositioned;Emotional support Multiple Pain Sites: No    See FIM for current functional status  Therapy/Group: Individual Therapy  Karolee Stamps [facility] 07/09/2013, 3:22 PM

## 2013-07-09 NOTE — Progress Notes (Signed)
Social Work Patient ID: [name] [name], female   DOB: [birth year], 58 y.o.   MRN: [id number]  CSW met with pt to discuss team conference from 07-08-13.  Pt has met mod I w/c goals and her ambulation goals have been downgraded.  Pt plans to do stairs on her bottom.  Pt confirmed that her friend, [name], will be coming to get her on Friday and she will receive some education regarding transfers that day from therapists.  Pt also stated that [name] will be helping her with meals, groceries, etc.  Pt plans to go to DSS in [facility] on Friday, as well, to get her Medicaid reinstated, as she had to turn in her review paperwork and had not done so prior to coming into the hospital.  CSW also gave pt the name of a pharmacy in [place] that can deliver her medications.   Pt understands she will need HH and does not want to use her prior agency.  She chose to use [facility] for [facility] and DME.  Pt only needs a w/c.  CSW will order pt's w/c and arrange HH.  CSW will continue to follow and assist pt with any other needs.

## 2013-07-09 NOTE — Progress Notes (Signed)
ANTICOAGULATION CONSULT NOTE - Follow Up Consult  Pharmacy Consult for Coumadin Indication: severe PVD  No Known Allergies  Patient Measurements: Height: 5\' 6"  (167.6 cm) Weight: 163 lb 2.3 oz (74 kg) IBW/kg (Calculated) : 59.3 Heparin Dosing Weight:   Vital Signs: Temp: 97.9 F (36.6 C) (09/17 0500) Temp src: Oral (09/17 0500) BP: 117/67 mmHg (09/17 0500) Pulse Rate: 79 (09/17 0500)  Labs:  Recent Labs  07/07/13 0605 07/08/13 0520 07/09/13 0530  HGB  --  9.7*  --   HCT  --  30.3*  --   PLT  --  633*  --   LABPROT 26.0* 28.6* 28.3*  INR 2.48* 2.81* 2.77*  CREATININE  --  0.97  --     Estimated Creatinine Clearance: 65.1 ml/min (by C-G formula based on Cr of 0.97).   Medications:  Scheduled:  . allopurinol  100 mg Oral Daily  . amitriptyline  50 mg Oral QHS  . atorvastatin  40 mg Oral q1800  . DULoxetine  60 mg Oral Daily  . insulin aspart  0-9 Units Subcutaneous TID WC  . linagliptin  5 mg Oral Daily  . methocarbamol  500 mg Oral QID  . OxyCODONE  20 mg Oral Q12H  . pantoprazole  40 mg Oral BID  . pregabalin  300 mg Oral BID  . senna-docusate  1 tablet Oral BID  . Warfarin - Pharmacist Dosing Inpatient   Does not apply q1800    Assessment: 57yo female with severe PVD.  INR 2.77 this AM, therapeutic on 3mg  daily.  No bleeding problems noted.    Goal of Therapy:  INR 2-3 Monitor platelets by anticoagulation protocol: Yes   Plan:  1.  Continue Coumadin 3mg  daily 2.  Plan on changing INR to 3 times/week if INR remain stable.  Marisue Humble, PharmD Clinical Pharmacist [facility]

## 2013-07-09 NOTE — Progress Notes (Addendum)
Occupational Therapy Session Note  Patient Details  Name: [name] [name] MRN: [id number] Date of Birth: 1955-12-05  Today's Date: 07/09/2013 Time: 4098-1191 Time Calculation (min): 28 min  Short Term Goals: Week 1:  OT Short Term Goal 1 (Week 1): Patient will complete bathing, seated, in standard tub using appropriate device, with supervision OT Short Term Goal 2 (Week 1): Patient will complete dressing, sitting and standing, with LRAD independently (Mod I) OT Short Term Goal 3 (Week 1): Patient will complete selected light homemaking task at w/c level with supervision OT Short Term Goal 4 (Week 1): Patient will complete grooming and hygiene at sink, in w/c, independently (Mod I).  Skilled Therapeutic Interventions/Progress Updates:  Patient completed prescribed therapeutic exercises to improve upper body strength in order to enhance w/c management, improve standing tolerance, and improve transfers.  Patient performed exercises with supervision, good effort throughout session, and reports plan to explore local facilities ([facility]) to continue HEP.    Therapy Documentation Precautions:  Precautions Precautions: Fall Restrictions Weight Bearing Restrictions: Yes RLE Weight Bearing: Non weight bearing  Pain: Pain Assessment Pain Assessment: 0-10 Pain Score: Asleep Pain Type: Surgical pain Pain Location: Leg Pain Orientation: Right Pain Descriptors / Indicators: Aching;Constant Pain Frequency: Constant Pain Onset: On-going Patients Stated Pain Goal: 5 Pain Intervention(s): Medication (See eMAR);Repositioned;Emotional support Multiple Pain Sites: No  Exercises: General Exercises - Upper Extremity Elbow Flexion: Both;10 reps;Strengthening;Bar weights/barbell;Seated (10 lbs, 2 sets) Elbow Extension: 10 reps;Strengthening;Seated;Bar weights/barbell;Right;Left (10 lbs, 2 sets) Other Exercises Other Exercises: Upper back, pull-downs, universal weight machine, 25 lbs, both  hands, 10 reps, 2 sets Other Exercises: Lower back, rowing, universal weight machine, both hands, 10 lbs, 10 reps, 2 sets,  See FIM for current functional status  Therapy/Group: Individual Therapy  Georgeanne Nim 07/09/2013, 3:17 PM

## 2013-07-09 NOTE — Progress Notes (Signed)
Occupational Therapy Session Note  Patient Details  Name: [name] [name] MRN: [id number] Date of Birth: [birth year]  Today's Date: 07/09/2013 Time: 1035-1130 Time Calculation (min): 55 min  Short Term Goals: Week 1:  OT Short Term Goal 1 (Week 1): Patient will complete bathing, seated, in standard tub using appropriate device, with supervision OT Short Term Goal 2 (Week 1): Patient will complete dressing, sitting and standing, with LRAD independently (Mod I) OT Short Term Goal 3 (Week 1): Patient will complete selected light homemaking task at w/c level with supervision OT Short Term Goal 4 (Week 1): Patient will complete grooming and hygiene at sink, in w/c, independently (Mod I).  Skilled Therapeutic Interventions/Progress Updates:  Patient found in hallway eating ice cream with complaints of 8/10 pain; Rn aware. Patient propelled self -> ADL apartment for light housekeeping/simple meal prep activity in kitchen. Patient able to complete these tasks at a set-up level from her w/c. Therapist discussed home safety and safety with ADLs & IADLs at home. Patient states her friend, [name] will be available to assist prn; preparing meals, housekeeping tasks, etc. Therapist discussed importance of using BSC in bedroom and NOT going into bathroom for toileting or showering tasks at this time secondary to bathroom set-up is not safe. Also, discussed kitchen baths (at a mod I level) VS. Bedroom(bed level)baths at a set-up level [name] to help set-up). [name] has not been present for any education to date. After kitchen tasks, patient propelled self -> therapy gym for BUE exercises using ergometer for ~10 minutes. Patient then transferred onto therapy mat for 3 sets of 10reps of block pushups. Patient then performed [facility] transfer in ADL apartment at mod I level. Patient propelled self back to room independently.   Precautions:  Precautions Precautions: Fall Restrictions Weight Bearing Restrictions: Yes RLE  Weight Bearing: Non weight bearing  See FIM for current functional status  Therapy/Group: Individual Therapy  Jillienne Egner 07/09/2013, 11:34 AM

## 2013-07-09 NOTE — Progress Notes (Signed)
Occupational Therapy Session Note  Patient Details  Name: [name] [name] MRN: [id number] Date of Birth: 10-31-1955  Today's Date: 07/09/2013 Time: 0900-0930 Time Calculation (min): 30 min  Short Term Goals: Week 1:  OT Short Term Goal 1 (Week 1): Patient will complete bathing, seated, in standard tub using appropriate device, with supervision OT Short Term Goal 2 (Week 1): Patient will complete dressing, sitting and standing, with LRAD independently (Mod I) OT Short Term Goal 3 (Week 1): Patient will complete selected light homemaking task at w/c level with supervision OT Short Term Goal 4 (Week 1): Patient will complete grooming and hygiene at sink, in w/c, independently (Mod I).  Skilled Therapeutic Interventions/Progress Updates:  Patient resting in bed upon arrival.  Focused session on sponge bath, dressing, toileting and toilet transfer.  Patient initially requested to bathe at sink then after discussion that her walker and w/c do not fit in the bathroom, patient performed sponged bath EOB with set up of water.  Patient is Mod I in her w/c so she is able to set up all other items.  Patient can also consider bathing at kitchen sink with w/c placed sideways to acommodate for cabinet under kitchen sink.  Patient transferred bed>w/c>toilet (heavy use of grab bars which she does not have at home)>w/c>bed for bath and dress in sit and stand.  When patient performed toileting she had a LOB posteriorly and was able to sit on the commode without issue secondary to heavy use of grab bars.  After review of LOB, patient reported that she feels she just needs to slow down.  Patient left in w/c with all items within reach.  Therapy Documentation Precautions:  Precautions Precautions: Fall Restrictions Weight Bearing Restrictions: Yes RLE Weight Bearing: Non weight bearing Pain: 6/10 right residual limb, premedicated, rest and repositioned. ADL: See FIM for current functional status  Therapy/Group:  Individual Therapy  Kenya Shiraishi 07/09/2013, 11:39 AM

## 2013-07-09 NOTE — Progress Notes (Signed)
Occupational Therapy Session Note  Patient Details  Name: [name] [name] MRN: [id number] Date of Birth: 07/09/1956  Today's Date: 07/09/2013 Time: 1333-1400 Time Calculation (min): 27 min  Short Term Goals: Week 1:  OT Short Term Goal 1 (Week 1): Patient will complete bathing, seated, in standard tub using appropriate device, with supervision OT Short Term Goal 2 (Week 1): Patient will complete dressing, sitting and standing, with LRAD independently (Mod I) OT Short Term Goal 3 (Week 1): Patient will complete selected light homemaking task at w/c level with supervision OT Short Term Goal 4 (Week 1): Patient will complete grooming and hygiene at sink, in w/c, independently (Mod I).  Skilled Therapeutic Interventions/Progress Updates:    Pt seen for 1:1 OT with focus on overall strengthening to assist in functional mobility, transfers, and sit <> stand.  Pt requested to focus on strengthening and stretching during this session.  Engaged in BUE arm bike in sitting on level 10 with 2 reps of 2 mins forward then backward for total of 8 minutes.  Pt HR 94 with activity.  Pt performed transfer on/off therapy mat independently, demonstrating w/c setup.  Pt able to recall basic stretching exercises to promote knee extension and performed 2 reps of 10.  Pt propelled self back to room and performed w/c to bed transfer independently.  Therapy Documentation Precautions:  Precautions Precautions: Fall Restrictions Weight Bearing Restrictions: Yes RLE Weight Bearing: Non weight bearing Pain: Pain Assessment Pain Assessment: 0-10 Pain Score: 8 Repositioned and stretching  See FIM for current functional status  Therapy/Group: Individual Therapy  Rosalio Loud 07/09/2013, 2:44 PM

## 2013-07-09 NOTE — Progress Notes (Signed)
Subjective/Complaints: Right leg remains tender. Trying to "work through" pain.  A 12 point review of systems has been performed and if not noted above is otherwise negative.   Objective: Vital Signs: Blood pressure 117/67, pulse 79, temperature 97.9 F (36.6 C), temperature source Oral, resp. rate 17, height 5\' 6"  (1.676 m), weight 74 kg (163 lb 2.3 oz), SpO2 93.00%. No results found.  Recent Labs  07/08/13 0520  WBC 9.0  HGB 9.7*  HCT 30.3*  PLT 633*    Recent Labs  07/08/13 0520  NA 142  K 3.7  CL 105  GLUCOSE 96  BUN 14  CREATININE 0.97  CALCIUM 8.6   CBG (last 3)   Recent Labs  07/08/13 1634 07/08/13 2038 07/09/13 0722  GLUCAP 130* 104* 89    Wt Readings from Last 3 Encounters:  07/09/13 74 kg (163 lb 2.3 oz)  07/01/13 72 kg (158 lb 11.7 oz)  07/01/13 72 kg (158 lb 11.7 oz)    Physical Exam:  Constitutional: She is oriented to person, place, and time. She appears well-developed and well-nourished.  HENT:  Head: Normocephalic and atraumatic.  Poor dentition.  Eyes: Conjunctivae are normal. Pupils are equal, round, and reactive to light.  Neck: Normal range of motion.  Cardiovascular: Normal rate and regular rhythm.  Pulmonary/Chest: Effort normal and breath sounds normal. No respiratory distress. She has no wheezes.  Abdominal: Soft. Bowel sounds are normal. She exhibits no distension. There is no tenderness.  Musculoskeletal: right stump tender, well shaped, edema reduced Neurological: She is alert and oriented to person, place, and time. Intact insight and awareness. Motor strength is 5/5 bilateral deltoid, bicep, tricep, grip Right hip flexor 3- /5---Left hip flexor 5/5 left knee extensor ankle dorsiflexor plantar flexor 5/5 Sensory exam is normal. Good sitting balance  Skin: Skin is warm and dry. Wound continues with moderate serosanginous drainage from mid/lateral areas. clean   Assessment/Plan: 1. Functional deficits secondary to right BKA  which require 3+ hours per day of interdisciplinary therapy in a comprehensive inpatient rehab setting. Physiatrist is providing close team supervision and 24 hour management of active medical problems listed below. Physiatrist and rehab team continue to assess barriers to discharge/monitor patient progress toward functional and medical goals.  Encouraged the patient to work through pain to an extent.  FIM: FIM - Bathing Bathing Steps Patient Completed: Chest;Right Arm;Left Arm;Abdomen;Front perineal area;Buttocks;Right upper leg;Left upper leg;Left lower leg (including foot) Bathing: 6: More than reasonable amount of time  FIM - Upper Body Dressing/Undressing Upper body dressing/undressing steps patient completed: Thread/unthread right bra strap;Thread/unthread left bra strap;Hook/unhook bra;Thread/unthread right sleeve of pullover shirt/dresss;Pull shirt over trunk;Put head through opening of pull over shirt/dress;Thread/unthread left sleeve of pullover shirt/dress Upper body dressing/undressing: 7: Complete Independence: No helper FIM - Lower Body Dressing/Undressing Lower body dressing/undressing steps patient completed: Thread/unthread right underwear leg;Thread/unthread left underwear leg;Pull underwear up/down;Thread/unthread right pants leg;Thread/unthread left pants leg;Pull pants up/down;Fasten/unfasten pants;Don/Doff left sock;Don/Doff left shoe;Fasten/unfasten left shoe Lower body dressing/undressing: 6: More than reasonable amount of time  FIM - Toileting Toileting steps completed by patient: Adjust clothing prior to toileting;Performs perineal hygiene;Adjust clothing after toileting Toileting Assistive Devices: Grab bar or rail for support Toileting: 6: More than reasonable amount of time  FIM - Diplomatic Services operational officer Devices: Grab bars Toilet Transfers: 6-More than reasonable amt of time  FIM - Banker Devices:  Arm rests Bed/Chair Transfer: 6: Assistive device: no helper  FIM - Locomotion: Wheelchair Locomotion: Wheelchair:  6: Travels 150 ft or more, turns around, maneuvers to table, bed or toilet, negotiates 3% grade: maneuvers on rugs and over door sills independently FIM - Locomotion: Ambulation Locomotion: Ambulation Assistive Devices: Designer, industrial/product Ambulation/Gait Assistance: 5: Supervision Locomotion: Ambulation: 1: Travels less than 50 ft with supervision/safety issues  Comprehension Comprehension Mode: Auditory Comprehension: 7-Follows complex conversation/direction: With no assist  Expression Expression Mode: Verbal Expression: 7-Expresses complex ideas: With no assist  Social Interaction Social Interaction: 7-Interacts appropriately with others - No medications needed.  Problem Solving Problem Solving: 7-Solves complex problems: Recognizes & self-corrects  Memory Memory: 7-Complete Independence: No helper Medical Problem List and Plan:  1. PVD/ RLE thrombectomy: Pharmaceutical: Coumadin  2. Pain Management: will likely need adjustment in medication for better management.   -increased oxycontin to 20mg  q12 for better pain control  -lyrica for phantom limb pain as well    -stump protection, edema control, elevation  -we discussed working through the pain to a degree,  3. Mood: Motivated to get better and home. In good spirits at present 4. Neuropsych: This patient is capable of making decisions on her own behalf.  5. DM type 2: will monitor with ac/hs cbg checks. Resumed sitagliptin SSI, sugars controlled.  6. LLL infiltrate/ID: Vanc/Zosyn 9/3-9/8--changed to Levaquin on 09/08 with recommendations for 7 total days of antibiotic therapy. --completed.    -wbc's decreased  -wound appears appropriate--continue current dressing 7. Peripheral neuropathy: Will continue Lyrica, Cymbalta and elavil. Pt is alert  8. Dyslipidemia:Will continue lipitor.  9. Ongoing tobacco abuse:  plans on quitting. Does not want to try nicotine patch at this time.  10. ABLA: still drifting down. Likely post-op. Stool for ob neg last week. hgb pending 11. HTN: Will monitor with bid checks. Off medications at this time.  12. Constipation: increased senokot s, prn meds   LOS (Days) 8 A FACE TO FACE EVALUATION WAS PERFORMED  SWARTZ,ZACHARY T 07/09/2013 7:40 AM

## 2013-07-09 NOTE — Patient Care Conference (Signed)
Inpatient RehabilitationTeam Conference and Plan of Care Update Date: 07/08/2013   Time: 2:40 PM    Patient Name: [name] [name]      Medical Record Number: [id number]  Date of Birth: 06-08-1956 Sex: Female         Room/Bed: 4M02C/4M02C-01 Payor Info: Payor: Advertising copywriter MEDICARE / Plan: Actuary / Product Type: *No Product type* /    Admitting Diagnosis: R BKA  Admit Date/Time:  07/01/2013  2:36 PM Admission Comments: No comment available   Primary Diagnosis:  Ischemic leg Principal Problem: Ischemic leg  Patient Active Problem List   Diagnosis Date Noted  . Ileus 06/24/2013  . Ischemic leg--R BKA 06/21/2013  . GI bleed 06/21/2013  . Acute blood loss anemia 06/21/2013  . PVD (peripheral vascular disease) 06/21/2013  . HTN (hypertension) 06/21/2013  . Diabetes 06/21/2013    Expected Discharge Date: Expected Discharge Date: 07/11/13  Team Members Present: Physician leading conference: Dr. Faith Rogue Social Worker Present: Amada Jupiter, LCSW Nurse Present: Leland Johns, RN PT Present: Karolee Stamps, PT OT Present: Mackie Pai, OT;Patricia Mat Carne, OT;Ardis Rowan, COTA PPS Coordinator present : Edson Snowball, PT     Current Status/Progress Goal Weekly Team Focus  Medical   right bka, pain issues. a lot of somatic complaints  stabilize medically for dc  pain control, positive reinforcment   Bowel/Bladder   Pt continent of B&B  Pt will remain continent of B&B  Pt will remain continent of B&B   Swallow/Nutrition/ Hydration             ADL's   mod I from w/c level, close supervision with RW for tub transfers  mod I transfers to [facility] from w/c, mod I dressing, mod I bathing, supervision with walker into bathroom   goals met, pt education, reinforcement of skills obtained   Mobility   S/to mod I w/c level; close S gait very limited distance due to pain; up to mod A for stairs/floor transfer  mod I w/c level; S short distance gait; min A stairs bumping up on bottom and for floor  transfer  stair negotiaiton/home entry with family ed, gait training, endurance, pain management, functional strengthening   Communication             Safety/Cognition/ Behavioral Observations            Pain   Pt c/o pain 10/10; Oxycodone 10-15mg  IR PO Q4h prn for severe pain; Oxycontin 10mg  PO BID; Robaxin 500mg  QID po scheduled; and Robaxin 500mg  Q6h PO prn   pain < 3  monitor and assess pain Qshift and prn   Skin   R knee with staples; incision draining serosanginous fluid; 4X4, Kerlix and Ace wrap enforced   incision will be free from infection/breakdown  monitor and assess incision qhsift and prn; change dressing as ordered and prn    Rehab Goals Patient on target to meet rehab goals: Yes Rehab Goals Revised: pt's discharge date was set for 07-11-13 with mod I w/c goals *See Care Plan and progress notes for long and short-term goals.  Barriers to Discharge: pain, participation    Possible Resolutions to Barriers:  w/c goals, pain mgt    Discharge Planning/Teaching Needs:  pt plans to go home to her duplex with the support of family and friends  pt's friend, [name], will come on day of d/c for training with stairs and transfers   Team Discussion:  Pt with multiple complaints, including pain. Pt will most likely not go home on  insulin.  Pt has met mod I w/c goals and plans to go up and down the stairs on her bottom.  Pt's friend will assist with meal, groceries, etc.  Pt's friend will receive education on day of d/c.  Revisions to Treatment Plan:  Ambulation goals were downgraded.   Continued Need for Acute Rehabilitation Level of Care: The patient requires daily medical management by a physician with specialized training in physical medicine and rehabilitation for the following conditions: Daily direction of a multidisciplinary physical rehabilitation program to ensure safe treatment while eliciting the highest outcome that is of practical value to the patient.: Yes Daily  medical management of patient stability for increased activity during participation in an intensive rehabilitation regime.: Yes Daily analysis of laboratory values and/or radiology reports with any subsequent need for medication adjustment of medical intervention for : Post surgical problems;Other  Aloura Matsuoka, [place] 07/09/2013, 1:50 PM

## 2013-07-09 NOTE — Progress Notes (Signed)
Explained to patient the need to practice wrapping her right BKA.  Patient currently refusing teaching of wrapping due to pain.  Will continue to monitor.

## 2013-07-10 ENCOUNTER — Inpatient Hospital Stay (HOSPITAL_COMMUNITY): Payer: PRIVATE HEALTH INSURANCE

## 2013-07-10 ENCOUNTER — Inpatient Hospital Stay (HOSPITAL_COMMUNITY): Payer: PRIVATE HEALTH INSURANCE | Admitting: Physical Therapy

## 2013-07-10 ENCOUNTER — Inpatient Hospital Stay (HOSPITAL_COMMUNITY): Payer: PRIVATE HEALTH INSURANCE | Admitting: Occupational Therapy

## 2013-07-10 LAB — GLUCOSE, CAPILLARY: Glucose-Capillary: 121 mg/dL — ABNORMAL HIGH (ref 70–99)

## 2013-07-10 LAB — PROTIME-INR: Prothrombin Time: 25.8 seconds — ABNORMAL HIGH (ref 11.6–15.2)

## 2013-07-10 MED ORDER — SENNOSIDES-DOCUSATE SODIUM 8.6-50 MG PO TABS
2.0000 | ORAL_TABLET | Freq: Two times a day (BID) | ORAL | Status: DC
  Administered 2013-07-11: 2 via ORAL
  Filled 2013-07-10 (×3): qty 2

## 2013-07-10 NOTE — Progress Notes (Signed)
Patient had a positive occult blood; Deatra Ina, PA notified.  Will continue to monitor.

## 2013-07-10 NOTE — Progress Notes (Signed)
Explained to patient that she needs to learn to ACE wrap and dress her leg since she is being discharged tomorrow; Marissa Nestle, PA requested that the patient be taught.  Patient states that a nurse is coming to the house to perform dressing changes and that she does not need to practice dressing her leg.  Explained the importance of practicing in the event that the dressing is soiled or nurse cannot make it to her house to change the dressing and patient continues to refuse to change dressing at this time.  Will continue to monitor.

## 2013-07-10 NOTE — Significant Event (Signed)
Patient stated she fell in the family room while getting coffee.  Patient states she stood up to get a cup of coffee and then sat back down in the chair.  She unknowingly had spilled coffee in the wheelchair prior to sitting back down.  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  Patient was unable to maintain balance and fell onto her buttocks.  Patient unsure if she hit her BKA.  Patient got herself back into the wheelchair and did not call for assistance.  Vitals stable, patient denies hitting her head.  Patient complains of pain in her buttocks.  No new skin issues noted.  BKA assessed and looks unchanged.  Marissa Nestle, PA notified of vitals and patient fall.  Marissa Nestle, PA to come to assess the patient.  Discussed importance of having family or staff with patient when going to the family room or laundry.  Will continue to monitor.

## 2013-07-10 NOTE — Progress Notes (Signed)
Occupational Therapy Session Note & Discharge Summary  Patient Details  Name: [name] [name] MRN: [id number] Date of Birth: [birth year]  Today's Date: 07/10/2013  SESSION NOTES  Session #1 [id number] - 55 Minutes Individual Therapy Patient with "11/10" during movement and 5/10 pain when still in right residual limb; RN aware of pain Upon entering room, patient found seated in w/c beside bed. Patient gathered necessary items for her ADL. Patient then sat at sink for UB/LB bathing & dressing. During these tasks, therapist discussed discharge -> home and how patient was going to perform these tasks at home. Patient stated she would perform bathing tasks in kitchen if she was alone OR she would have Gloria set-up water in her bedroom and perform bathing from bed level. Patient performed toilet transfer at mod I level w/c <>elevated toilet seat and toileting tasks independently. Patient then propelled self to gym for BUE strengthening exercises using ergometer for ~10 minutes. Patient left in gym for next therapy session.   Session #2 [id number] - 40 Minutes Individual Therapy Patient with complaints of pain in right residual limb; RN aware and administered meds Patient found seated edge of bed. Patient performed EOB -> w/c transfer at mod I level. Patient then propelled self from room -> [facility] on 3rd floor of hospital. While in [place], patient engaged in multiple w/c <>furniture transfers for simulation of when patient's discharges. Patient states she does not want to "just stay in the wheelchair" once at home. Patient also propelled w/c to outside part of [facility] and transferred onto a wood bench. Patient propelled self back to room and left at nurses station talking with staff.   -------------------------------------------------------------------------------------------------------------------------------------------  DISCHARGE SUMMARY Patient has met 5 of 5 long term goals due to improved  activity tolerance, improved balance, postural control, ability to compensate for deficits, improved attention, improved awareness and improved coordination.  Patient to discharge at overall Modified Independent level. No caregiver has been present for education at this time; patient is performing basic tasks at an overall modified independent level. Patient states that her friend, [name], will be available prn for set-up assist and some IADL tasks. [name] has not been present during any therapy sessions.   Reasons goals not met: n/a, all goals met at this time.  Recommendation:  Patient will benefit from ongoing skilled OT services in home health setting to continue to advance functional skills in the area of iADL; patient is to live alone. Recommending HHOT to work on higher level iADL tasks throughout the house.   Equipment: No equipment provided, patient states she currently has a BSC and tub transfer bench.   Reasons for discharge: treatment goals met and discharge from hospital  Patient/family agrees with progress made and goals achieved: Yes  Precautions/Restrictions  Precautions Precautions: Fall Restrictions Weight Bearing Restrictions: Yes RLE Weight Bearing: Non weight bearing  Vision/Perception  Vision - History Baseline Vision: Wears glasses all the time Patient Visual Report: Blurring of vision (secondary to no glasses) Perception Perception: Within Functional Limits Praxis Praxis: Intact   Cognition Overall Cognitive Status: Within Functional Limits for tasks assessed Orientation Level: Oriented X4 Attention: Divided Alternating Attention: Appears intact Divided Attention: Appears intact Memory: Appears intact Awareness: Appears intact Problem Solving: Appears intact Executive Function: Self Monitoring Self Monitoring: Appears intact Safety/Judgment: Appears intact  Sensation Sensation Light Touch: Appears Intact (BUEs) Stereognosis: Appears Intact  (BUEs) Hot/Cold: Appears Intact (BUEs) Proprioception: Appears Intact (BUEs) Coordination Gross Motor Movements are Fluid and Coordinated: Yes Fine Motor Movements are Fluid and  Coordinated: Yes  Motor  Motor Motor: Within Functional Limits  Mobility  Mod I with rolling walker  Balance Mod I with rolling walker  Extremity/Trunk Assessment RUE Assessment RUE Assessment: Within Functional Limits LUE Assessment LUE Assessment: Within Functional Limits  See FIM for current functional status  Eliane Hammersmith 07/10/2013, 2:40 PM

## 2013-07-10 NOTE — Progress Notes (Addendum)
Patient reports fall with onset of right hip pain. No other pain or symptoms reported. Reports pain shooting from hip to amp site.   Exam:  Pain right hip to palpation and ROM. No pain with ROM left hip.  A/P 1. Will get right hip and pelvis xray's to rule out fracture.

## 2013-07-10 NOTE — Progress Notes (Signed)
Physical Therapy Session Note  Patient Details  Name: [name] [name] MRN: [id number] Date of Birth: 08/15/1956  Today's Date: 07/10/2013 Time: 1130-1155 Time Calculation (min): 25 min  Short Term Goals: Week 1:  PT Short Term Goal 1 (Week 1): = LTGs  Skilled Therapeutic Interventions/Progress Updates:    Session 1 (1000-1030) pt sitting up in bed.  Squat pivot transfer to W/C ModI.  W/C mobility 150' Mod I with pt independent  With leg rests and brakes.  W/C to floor transfer Mod I.  Performed stairs by going up on pt's bottom with S and set-up.  Floor to W/C transfer with set-up as pt used small step stool in front of W/C.  Amb in hallway 18' S with RW.  Max encourgement for ambulation.    Session 2 (1130-1155) pt sitting in W/C in gym.  W/C propulsion with Bil UEs Mod I 150'.  Pt performed set-up and squat pivot transfer with Mod I.  Repeated transfer x5.  Sit to supine with Mod I.  Repeated x3.  Pt performed R knee quad sets x15.    Therapy Documentation Precautions:  Precautions Precautions: Fall Restrictions Weight Bearing Restrictions: Yes RLE Weight Bearing: Non weight bearing  See FIM for current functional status  Therapy/Group: Individual Therapy  Damier Disano, Alison Murray 07/10/2013, 12:21 PM

## 2013-07-10 NOTE — Progress Notes (Signed)
Subjective/Complaints: Working through pain. Excited to go home. A 12 point review of systems has been performed and if not noted above is otherwise negative.   Objective: Vital Signs: Blood pressure 124/76, pulse 74, temperature 98.3 F (36.8 C), temperature source Oral, resp. rate 17, height 5\' 6"  (1.676 m), weight 71 kg (156 lb 8.4 oz), SpO2 93.00%. No results found.  Recent Labs  07/08/13 0520 07/09/13 0850  WBC 9.0 8.6  HGB 9.7* 9.7*  HCT 30.3* 30.8*  PLT 633* 653*    Recent Labs  07/08/13 0520  NA 142  K 3.7  CL 105  GLUCOSE 96  BUN 14  CREATININE 0.97  CALCIUM 8.6   CBG (last 3)   Recent Labs  07/09/13 1623 07/09/13 2137 07/10/13 0729  GLUCAP 106* 107* 83    Wt Readings from Last 3 Encounters:  07/10/13 71 kg (156 lb 8.4 oz)  07/01/13 72 kg (158 lb 11.7 oz)  07/01/13 72 kg (158 lb 11.7 oz)    Physical Exam:  Constitutional: She is oriented to person, place, and time. She appears well-developed and well-nourished.  HENT:  Head: Normocephalic and atraumatic.  Poor dentition.  Eyes: Conjunctivae are normal. Pupils are equal, round, and reactive to light.  Neck: Normal range of motion.  Cardiovascular: Normal rate and regular rhythm.  Pulmonary/Chest: Effort normal and breath sounds normal. No respiratory distress. She has no wheezes.  Abdominal: Soft. Bowel sounds are normal. She exhibits no distension. There is no tenderness.  Musculoskeletal: right stump tender, well shaped, edema reduced Neurological: She is alert and oriented to person, place, and time. Intact insight and awareness. Motor strength is 5/5 bilateral deltoid, bicep, tricep, grip Right hip flexor 3- /5---Left hip flexor 5/5 left knee extensor ankle dorsiflexor plantar flexor 5/5 Sensory exam is normal. Good sitting balance  Skin: Skin is warm and dry. Wound continues with moderate serosanginous drainage from mid/lateral areas. clean   Assessment/Plan: 1. Functional deficits  secondary to right BKA which require 3+ hours per day of interdisciplinary therapy in a comprehensive inpatient rehab setting. Physiatrist is providing close team supervision and 24 hour management of active medical problems listed below. Physiatrist and rehab team continue to assess barriers to discharge/monitor patient progress toward functional and medical goals.    FIM: FIM - Bathing Bathing Steps Patient Completed: Chest;Right Arm;Left Arm;Abdomen;Front perineal area;Buttocks;Right upper leg;Left upper leg;Left lower leg (including foot) Bathing: 5: Set-up assist to: Obtain items (Obtain water for bath EOB)  FIM - Upper Body Dressing/Undressing Upper body dressing/undressing steps patient completed: Thread/unthread right bra strap;Thread/unthread left bra strap;Hook/unhook bra;Thread/unthread right sleeve of pullover shirt/dresss;Pull shirt over trunk;Put head through opening of pull over shirt/dress;Thread/unthread left sleeve of pullover shirt/dress Upper body dressing/undressing: 7: Complete Independence: No helper FIM - Lower Body Dressing/Undressing Lower body dressing/undressing steps patient completed: Thread/unthread right underwear leg;Thread/unthread left underwear leg;Pull underwear up/down;Thread/unthread right pants leg;Thread/unthread left pants leg;Pull pants up/down;Fasten/unfasten pants;Don/Doff left sock;Don/Doff left shoe;Fasten/unfasten left shoe Lower body dressing/undressing: 6: More than reasonable amount of time  FIM - Toileting Toileting steps completed by patient: Adjust clothing prior to toileting;Performs perineal hygiene;Adjust clothing after toileting Toileting Assistive Devices: Grab bar or rail for support Toileting: 5: Supervision: Safety issues/verbal cues (LOB posteriorly and heavy reliance on grab bars)  FIM - Diplomatic Services operational officer Devices: Grab bars Toilet Transfers: 6-More than reasonable amt of time  FIM - Physiological scientist Devices: Arm rests Bed/Chair Transfer: 7: Supine > Sit: No assist;6: Bed >  Chair or W/C: No assist;6: Chair or W/C > Bed: No assist  FIM - Locomotion: Wheelchair Locomotion: Wheelchair: 6: Travels 150 ft or more, turns around, maneuvers to table, bed or toilet, negotiates 3% grade: maneuvers on rugs and over door sills independently FIM - Locomotion: Ambulation Locomotion: Ambulation Assistive Devices: Designer, industrial/product Ambulation/Gait Assistance: 5: Supervision Locomotion: Ambulation: 1: Travels less than 50 ft with supervision/safety issues  Comprehension Comprehension Mode: Auditory Comprehension: 7-Follows complex conversation/direction: With no assist  Expression Expression Mode: Verbal Expression: 7-Expresses complex ideas: With no assist  Social Interaction Social Interaction: 7-Interacts appropriately with others - No medications needed.  Problem Solving Problem Solving: 7-Solves complex problems: Recognizes & self-corrects  Memory Memory: 7-Complete Independence: No helper  Medical Problem List and Plan:  1. PVD/ RLE thrombectomy: Pharmaceutical: Coumadin  2. Pain Management: will likely need adjustment in medication for better management.   -increased oxycontin to 20mg  q12 for better pain control  -lyrica for phantom limb pain as well    -stump protection, edema control, elevation  -we discussed working through the pain to a degree  3. Mood: Motivated to get better and home. In good spirits at present 4. Neuropsych: This patient is capable of making decisions on her own behalf.  5. DM type 2: will monitor with ac/hs cbg checks. Resumed sitagliptin SSI, sugars controlled.  6. LLL infiltrate/ID: Vanc/Zosyn 9/3-9/8--changed to Levaquin on 09/08 with recommendations for 7 total days of antibiotic therapy. --completed.    -wbc's decreased  -wound still with some serosanginous drainage. Continue dry dressing, ACE 7. Peripheral  neuropathy: Will continue Lyrica, Cymbalta and elavil. Pt is alert  8. Dyslipidemia:Will continue lipitor.  9. Ongoing tobacco abuse: plans on quitting. Does not want to try nicotine patch at this time.  10. ABLA: still drifting down. Likely post-op. hgb stable at 9.7 11. HTN: Will monitor with bid checks. Off medications at this time.  12. Constipation: increased senokot s, prn meds   LOS (Days) 9 A FACE TO FACE EVALUATION WAS PERFORMED  Ruwayda Curet T 07/10/2013 8:01 AM

## 2013-07-10 NOTE — Progress Notes (Signed)
Occupational Therapy Session Note  Patient Details  Name: [name] [name] MRN: [id number] Date of Birth: 1956-07-11  Today's Date: 07/10/2013 Time: 0830-0900 Time Calculation (min): 30 min  Short Term Goals: Week 1:  OT Short Term Goal 1 (Week 1): Patient will complete bathing, seated, in standard tub using appropriate device, with supervision OT Short Term Goal 2 (Week 1): Patient will complete dressing, sitting and standing, with LRAD independently (Mod I) OT Short Term Goal 3 (Week 1): Patient will complete selected light homemaking task at w/c level with supervision OT Short Term Goal 4 (Week 1): Patient will complete grooming and hygiene at sink, in w/c, independently (Mod I).  Skilled Therapeutic Interventions/Progress Updates: Therapeutic exercises with emphasis on upper/lower back and upper arm strengthening.  Patient completed exercises as instructed with good attention and effort despite report of pain at residual limb.  Patient verbalizes understanding of relationship to upper body strength and endurance to improved mobility.   Recommended w/c gloves for improved w/c propulsion and control.   Therapy Documentation Precautions:  Precautions Precautions: Fall Restrictions Weight Bearing Restrictions: Yes RLE Weight Bearing: Non weight bearing  Pain: Pain Assessment Pain Assessment: 0-10 Pain Score: 8   Exercises: General Exercises - Upper Extremity Elbow Flexion: Both;10 reps;Strengthening;Bar weights/barbell;Seated Elbow Extension: 10 reps;Strengthening;Seated;Bar weights/barbell;Right;Left Other Exercises Other Exercises: Upper back, pull-downs, universal weight machine, 25 lbs, both hands, 10 reps, 2 sets Other Exercises: lower back, rowing, universal weight machine, both hands, 10 lbs, 10 reps, 2 sets, Other Treatments:    See FIM for current functional status  Therapy/Group: Individual Therapy  Georgeanne Nim 07/10/2013, 12:53 PM

## 2013-07-10 NOTE — Progress Notes (Signed)
ANTICOAGULATION CONSULT NOTE - Follow Up Consult  Pharmacy Consult for couamdin Indication: severe PVD  No Known Allergies  Patient Measurements: Height: 5\' 6"  (167.6 cm) Weight: 156 lb 8.4 oz (71 kg) IBW/kg (Calculated) : 59.3 Heparin Dosing Weight:   Vital Signs: Temp: 98.3 F (36.8 C) (09/18 0543) Temp src: Oral (09/18 0543) BP: 124/76 mmHg (09/18 0543) Pulse Rate: 74 (09/18 0543)  Labs:  Recent Labs  07/08/13 0520 07/09/13 0530 07/09/13 0850 07/10/13 0538  HGB 9.7*  --  9.7*  --   HCT 30.3*  --  30.8*  --   PLT 633*  --  653*  --   LABPROT 28.6* 28.3*  --  25.8*  INR 2.81* 2.77*  --  2.45*  CREATININE 0.97  --   --   --     Estimated Creatinine Clearance: 59.2 ml/min (by C-G formula based on Cr of 0.97).   Medications:  Scheduled:  . allopurinol  100 mg Oral Daily  . amitriptyline  50 mg Oral QHS  . atorvastatin  40 mg Oral q1800  . DULoxetine  60 mg Oral Daily  . insulin aspart  0-9 Units Subcutaneous TID WC  . linagliptin  5 mg Oral Daily  . methocarbamol  500 mg Oral QID  . OxyCODONE  20 mg Oral Q12H  . pantoprazole  40 mg Oral BID  . pregabalin  300 mg Oral BID  . senna-docusate  1 tablet Oral BID  . warfarin  3 mg Oral q1800  . Warfarin - Pharmacist Dosing Inpatient   Does not apply q1800   Infusions:    Assessment: 57 yo female with hx of severe PVD is currently on therapeutic coumadin.  INR is 2.45 today.   Goal of Therapy:  INR 2-3 Monitor platelets by anticoagulation protocol: Yes   Plan:  1) Continue coumadin 3mg  po x1 2. F/U in AM, change to MWF if remain OK  Traci Gafford, Tsz-Yin 07/10/2013,8:04 AM

## 2013-07-11 ENCOUNTER — Inpatient Hospital Stay (HOSPITAL_COMMUNITY): Payer: PRIVATE HEALTH INSURANCE | Admitting: Physical Therapy

## 2013-07-11 DIAGNOSIS — I739 Peripheral vascular disease, unspecified: Secondary | ICD-10-CM

## 2013-07-11 DIAGNOSIS — S88119A Complete traumatic amputation at level between knee and ankle, unspecified lower leg, initial encounter: Secondary | ICD-10-CM

## 2013-07-11 DIAGNOSIS — L98499 Non-pressure chronic ulcer of skin of other sites with unspecified severity: Secondary | ICD-10-CM

## 2013-07-11 LAB — CBC
HCT: 30.2 % — ABNORMAL LOW (ref 36.0–46.0)
Hemoglobin: 9.7 g/dL — ABNORMAL LOW (ref 12.0–15.0)
MCV: 86.8 fL (ref 78.0–100.0)
RDW: 16.7 % — ABNORMAL HIGH (ref 11.5–15.5)
WBC: 8.3 10*3/uL (ref 4.0–10.5)

## 2013-07-11 LAB — GLUCOSE, CAPILLARY
Glucose-Capillary: 96 mg/dL (ref 70–99)
Glucose-Capillary: 123 mg/dL — ABNORMAL HIGH (ref 70–99)

## 2013-07-11 LAB — PROTIME-INR: INR: 2.44 — ABNORMAL HIGH (ref 0.00–1.49)

## 2013-07-11 MED ORDER — METHOCARBAMOL 500 MG PO TABS: 500.0000 mg | ORAL_TABLET | Freq: Four times a day (QID) | ORAL | Status: AC

## 2013-07-11 MED ORDER — WARFARIN SODIUM 2 MG PO TABS: 3.0000 mg | ORAL_TABLET | Freq: Every day | ORAL | Status: AC

## 2013-07-11 MED ORDER — PANTOPRAZOLE SODIUM 40 MG PO TBEC: 40.0000 mg | DELAYED_RELEASE_TABLET | Freq: Two times a day (BID) | ORAL | Status: AC

## 2013-07-11 MED ORDER — OXYCODONE HCL ER 20 MG PO T12A: 20.0000 mg | EXTENDED_RELEASE_TABLET | Freq: Two times a day (BID) | ORAL | Status: AC

## 2013-07-11 MED ORDER — SENNOSIDES-DOCUSATE SODIUM 8.6-50 MG PO TABS: 2.0000 | ORAL_TABLET | Freq: Two times a day (BID) | ORAL | Status: AC

## 2013-07-11 MED ORDER — OXYCODONE HCL 15 MG PO TABS: 15.0000 mg | ORAL_TABLET | Freq: Four times a day (QID) | ORAL | Status: AC | PRN

## 2013-07-11 MED ORDER — ACETAMINOPHEN 325 MG PO TABS: 325.0000 mg | ORAL_TABLET | ORAL | Status: AC | PRN

## 2013-07-11 NOTE — Progress Notes (Signed)
Subjective/Complaints: Fell yesterday when getting coffee. Excited to go home. A 12 point review of systems has been performed and if not noted above is otherwise negative.   Objective: Vital Signs: Blood pressure 124/76, pulse 76, temperature 98.4 F (36.9 C), temperature source Oral, resp. rate 18, height 5\' 6"  (1.676 m), weight 71.7 kg (158 lb 1.1 oz), SpO2 94.00%. Dg Pelvis 1-2 Views  07/10/2013   CLINICAL DATA:  Right hip pain status post fall  EXAM: PELVIS - 1-2 VIEW  COMPARISON:  None.  FINDINGS: No fracture or dislocation is seen.  Visualized bony pelvis appears intact.  Bilateral hip joint spaces are symmetric.  Vascular stents in the left iliac and right groin.  Moderate colonic stool burden.  IMPRESSION: No fracture or dislocation is seen.   Electronically Signed   By: Charline Bills M.D.   On: 07/10/2013 20:42   Dg Hip Complete Right  07/10/2013   CLINICAL DATA:  Fall, right hip pain  EXAM: RIGHT HIP - COMPLETE 2+ VIEW  COMPARISON:  None.  FINDINGS: No fracture or dislocation is seen.  Right hip joint space is preserved.  Vascular stents.  IMPRESSION: No fracture or dislocation is seen.   Electronically Signed   By: Charline Bills M.D.   On: 07/10/2013 20:41    Recent Labs  07/09/13 0850 07/11/13 0550  WBC 8.6 8.3  HGB 9.7* 9.7*  HCT 30.8* 30.2*  PLT 653* 562*   No results found for this basename: NA, K, CL, CO, GLUCOSE, BUN, CREATININE, CALCIUM,  in the last 72 hours CBG (last 3)   Recent Labs  07/10/13 1613 07/10/13 2056 07/11/13 0714  GLUCAP 121* 111* 96    Wt Readings from Last 3 Encounters:  07/11/13 71.7 kg (158 lb 1.1 oz)  07/01/13 72 kg (158 lb 11.7 oz)  07/01/13 72 kg (158 lb 11.7 oz)    Physical Exam:  Constitutional: She is oriented to person, place, and time. She appears well-developed and well-nourished.  HENT:  Head: Normocephalic and atraumatic.  Poor dentition.  Eyes: Conjunctivae are normal. Pupils are equal, round, and reactive to  light.  Neck: Normal range of motion.  Cardiovascular: Normal rate and regular rhythm.  Pulmonary/Chest: Effort normal and breath sounds normal. No respiratory distress. She has no wheezes.  Abdominal: Soft. Bowel sounds are normal. She exhibits no distension. There is no tenderness.  Musculoskeletal: right stump tender, well shaped, edema reduced Neurological: She is alert and oriented to person, place, and time. Intact insight and awareness. Motor strength is 5/5 bilateral deltoid, bicep, tricep, grip Right hip flexor 3- /5---Left hip flexor 5/5 left knee extensor ankle dorsiflexor plantar flexor 5/5 Sensory exam is normal. Good sitting balance  Skin: Skin is warm and dry. Wound continues with moderate serosanginous drainage from mid/lateral areas. clean   Assessment/Plan: 1. Functional deficits secondary to right BKA which require 3+ hours per day of interdisciplinary therapy in a comprehensive inpatient rehab setting. Physiatrist is providing close team supervision and 24 hour management of active medical problems listed below. Physiatrist and rehab team continue to assess barriers to discharge/monitor patient progress toward functional and medical goals.  reviewed appropriate judgement and safety awareness.   FIM: FIM - Bathing Bathing Steps Patient Completed: Chest;Right Arm;Left Arm;Abdomen;Front perineal area;Buttocks;Right upper leg;Left upper leg;Left lower leg (including foot) Bathing: 6: Assistive device (Comment)  FIM - Upper Body Dressing/Undressing Upper body dressing/undressing steps patient completed: Thread/unthread right bra strap;Thread/unthread left bra strap;Hook/unhook bra;Thread/unthread right sleeve of pullover shirt/dresss;Pull shirt over  trunk;Put head through opening of pull over shirt/dress;Thread/unthread left sleeve of pullover shirt/dress Upper body dressing/undressing: 7: Complete Independence: No helper FIM - Lower Body Dressing/Undressing Lower body  dressing/undressing steps patient completed: Thread/unthread right underwear leg;Thread/unthread left underwear leg;Pull underwear up/down;Thread/unthread right pants leg;Thread/unthread left pants leg;Pull pants up/down;Fasten/unfasten pants;Don/Doff left sock;Don/Doff left shoe;Fasten/unfasten left shoe Lower body dressing/undressing: 6: Assistive device (Comment)  FIM - Toileting Toileting steps completed by patient: Adjust clothing prior to toileting;Performs perineal hygiene;Adjust clothing after toileting Toileting Assistive Devices: Grab bar or rail for support Toileting: 6: Assistive device: No helper  FIM - Diplomatic Services operational officer Devices: Grab bars Toilet Transfers: 6-More than reasonable amt of time  FIM - Banker Devices: Arm rests Bed/Chair Transfer: 6: Supine > Sit: No assist;6: Sit > Supine: No assist;6: Bed > Chair or W/C: No assist;6: Chair or W/C > Bed: No assist  FIM - Locomotion: Wheelchair Distance: 150 Locomotion: Wheelchair: 6: Travels 150 ft or more, turns around, maneuvers to table, bed or toilet, negotiates 3% grade: maneuvers on rugs and over door sills independently FIM - Locomotion: Ambulation Locomotion: Ambulation Assistive Devices: Designer, industrial/product Ambulation/Gait Assistance: 5: Supervision Locomotion: Ambulation: 2: Travels 50 - 149 ft with supervision/safety issues  Comprehension Comprehension Mode: Auditory Comprehension: 7-Follows complex conversation/direction: With no assist  Expression Expression Mode: Verbal Expression: 7-Expresses complex ideas: With no assist  Social Interaction Social Interaction: 6-Interacts appropriately with others with medication or extra time (anti-anxiety, antidepressant).  Problem Solving Problem Solving: 7-Solves complex problems: Recognizes & self-corrects  Memory Memory: 7-Complete Independence: No helper  Medical Problem List and Plan:  1. PVD/  RLE thrombectomy: Pharmaceutical: Coumadin  2. Pain Management: will likely need adjustment in medication for better management.   -increased oxycontin to 20mg  q12 for better pain control  -lyrica for phantom limb pain as well    -stump protection, edema control, elevation  -we discussed working through the pain to a degree  3. Mood: Motivated to get better and home. In good spirits at present 4. Neuropsych: This patient is capable of making decisions on her own behalf.  5. DM type 2: will monitor with ac/hs cbg checks. Resumed sitagliptin SSI, sugars controlled.  6. LLL infiltrate/ID: Vanc/Zosyn 9/3-9/8--changed to Levaquin on 09/08 with recommendations for 7 total days of antibiotic therapy. --completed.    -wbc's decreased  -dry dressing to wound. HHRN to follow up 7. Peripheral neuropathy: Will continue Lyrica, Cymbalta and elavil. Pt is alert  8. Dyslipidemia:Will continue lipitor.  9. Ongoing tobacco abuse: plans on quitting. Does not want to try nicotine patch at this time.  10. ABLA: still drifting down. Likely post-op. hgb stable at 9.7 11. HTN: Will monitor with bid checks. Off medications at this time.  12. Constipation: increased senokot s, prn meds   LOS (Days) 10 A FACE TO FACE EVALUATION WAS PERFORMED  SWARTZ,ZACHARY T 07/11/2013 8:27 AM

## 2013-07-11 NOTE — Progress Notes (Signed)
Physical Therapy Discharge Summary  Patient Details  Name: [name] [name] MRN: [id number] Date of Birth: [birth year]  Today's Date: 07/11/2013  Patient has met 10 of 10 long term goals due to improved activity tolerance, improved balance, decreased pain and ability to compensate for deficits.  Patient to discharge at a wheelchair level Modified Independent and S with RW for short distance gait (limited by pain).  Patient's friend, [name],  is independent to provide the necessary  assistance at discharge and completed education with PT. Recommendation and education with pt and friend on bumping up on bottom for home entry/exit.  Reasons goals not met: n/a. All goals met at this time.  Recommendation:  Patient will benefit from ongoing skilled PT services in home health setting to continue to advance safe functional mobility, address ongoing impairments in strength, pain, pre-prosthetic training, gait, balance, endurance, and minimize fall risk.  Equipment: 16 x 18 w/c with R amputee pad; Pt already owns RW per report.  Reasons for discharge: treatment goals met and discharge from hospital  Patient/family agrees with progress made and goals achieved: Yes  PT Discharge Precautions/Restrictions Precautions Precautions: Fall Restrictions Weight Bearing Restrictions: Yes RLE Weight Bearing: Non weight bearing Vision/Perception    WFL Cognition Overall Cognitive Status: Within Functional Limits for tasks assessed Orientation Level: Oriented X4 Sensation Sensation Light Touch: Appears Intact Coordination Gross Motor Movements are Fluid and Coordinated: Yes Motor  Motor Motor: Within Functional Limits     Trunk/Postural Assessment  Cervical Assessment Cervical Assessment: Within Functional Limits Thoracic Assessment Thoracic Assessment: Within Functional Limits Lumbar Assessment Lumbar Assessment: Within Functional Limits Postural Control Postural Control: Within Functional  Limits  Balance Balance Balance Assessed: Yes Static Sitting Balance Static Sitting - Level of Assistance: 6: Modified independent (Device/Increase time) Dynamic Sitting Balance Dynamic Sitting - Level of Assistance: 6: Modified independent (Device/Increase time) Static Standing Balance Static Standing - Level of Assistance: 6: Modified independent (Device/Increase time) (with RW) Dynamic Standing Balance Dynamic Standing - Level of Assistance: 6: Modified independent (Device/Increase time) (with RW) Extremity Assessment  RLE Strength RLE Overall Strength Comments: R BKA; decreased extension mainly due to pain grossly 3/5 LLE Assessment LLE Assessment: Within Functional Limits (decreased muscular endurance)  See FIM for current functional status  Karolee Stamps [facility] 07/11/2013, 12:37 PM

## 2013-07-11 NOTE — Progress Notes (Signed)
ANTICOAGULATION CONSULT NOTE - Follow Up Consult  Pharmacy Consult for coumadin Indication: severe PVD  No Known Allergies  Patient Measurements: Height: 5\' 6"  (167.6 cm) Weight: 158 lb 1.1 oz (71.7 kg) IBW/kg (Calculated) : 59.3 Heparin Dosing Weight:   Vital Signs: Temp: 98.4 F (36.9 C) (09/19 0713) Temp src: Oral (09/19 0713) BP: 124/76 mmHg (09/19 0713) Pulse Rate: 76 (09/19 0713)  Labs:  Recent Labs  07/09/13 0530 07/09/13 0850 07/10/13 0538 07/11/13 0550  HGB  --  9.7*  --  9.7*  HCT  --  30.8*  --  30.2*  PLT  --  653*  --  562*  LABPROT 28.3*  --  25.8* 25.7*  INR 2.77*  --  2.45* 2.44*    Estimated Creatinine Clearance: 64.2 ml/min (by C-G formula based on Cr of 0.97).   Medications:  Scheduled:  . allopurinol  100 mg Oral Daily  . amitriptyline  50 mg Oral QHS  . atorvastatin  40 mg Oral q1800  . DULoxetine  60 mg Oral Daily  . insulin aspart  0-9 Units Subcutaneous TID WC  . linagliptin  5 mg Oral Daily  . methocarbamol  500 mg Oral QID  . OxyCODONE  20 mg Oral Q12H  . pantoprazole  40 mg Oral BID  . pregabalin  300 mg Oral BID  . senna-docusate  2 tablet Oral BID  . warfarin  3 mg Oral q1800  . Warfarin - Pharmacist Dosing Inpatient   Does not apply q1800   Infusions:    Assessment: 57 yo female with hx of severe PVD is currently on therapeutic coumadin.  INR today is 2.44 (stable) Goal of Therapy:  INR 2-3 Monitor platelets by anticoagulation protocol: Yes   Plan:  1) Continue coumadin 3mg  po daily 2. F/U in AM, change to MWF if remain OK    Daneil Beem, Tsz-Yin 07/11/2013,8:29 AM

## 2013-07-11 NOTE — Progress Notes (Signed)
Physical Therapy Session Note  Patient Details  Name: [name] [name] MRN: [id number] Date of Birth: 04/12/1956  Today's Date: 07/11/2013 Time: 6213-0865 Time Calculation (min): 29 min  Short Term Goals: Week 1:  PT Short Term Goal 1 (Week 1): = LTGs  Skilled Therapeutic Interventions/Progress Updates:    pt sitting EOB with 2 friends present, who will be assisting the pt at home.  Squat pivot transfer with Mod I.  Pt propels W/C 160' with Bil UEs Mod I.  Performed transfer to mat on floor and scoot up 2 steps on bottom, with pt attempting to educate friends on how to help her.  Cues needed for sequencing and safety.  Educated friends on W/C management and set-up of home for ease of mobility for pt.  Pt and friends eager for D/C and state they have no other questions.    Therapy Documentation Precautions:  Precautions Precautions: Fall Restrictions Weight Bearing Restrictions: Yes RLE Weight Bearing: Non weight bearing  See FIM for current functional status  Therapy/Group: Individual Therapy  Alayha Babineaux, Alison Murray 07/11/2013, 12:07 PM

## 2013-07-11 NOTE — Discharge Summary (Signed)
Physician Discharge Summary  Patient ID: [name] [name] MRN: [id number] DOB/AGE: 1955-11-24 57 y.o.  Admit date: 07/01/2013 Discharge date: 07/11/2013  Discharge Diagnoses:  Principal Problem:   Ischemic leg--R BKA Active Problems:   Acute blood loss anemia   PVD (peripheral vascular disease)   HTN (hypertension)   Diabetes   Discharged Condition:  Stable  Significant Diagnostic Studies: Dg Pelvis 1-2 Views  07/10/2013   CLINICAL DATA:  Right hip pain status post fall  EXAM: PELVIS - 1-2 VIEW  COMPARISON:  None.  FINDINGS: No fracture or dislocation is seen.  Visualized bony pelvis appears intact.  Bilateral hip joint spaces are symmetric.  Vascular stents in the left iliac and right groin.  Moderate colonic stool burden.  IMPRESSION: No fracture or dislocation is seen.   Electronically Signed   By: Charline Bills M.D.   On: 07/10/2013 20:42   Dg Hip Complete Right  07/10/2013   CLINICAL DATA:  Fall, right hip pain  EXAM: RIGHT HIP - COMPLETE 2+ VIEW  COMPARISON:  None.  FINDINGS: No fracture or dislocation is seen.  Right hip joint space is preserved.  Vascular stents.  IMPRESSION: No fracture or dislocation is seen.   Electronically Signed   By: Charline Bills M.D.   On: 07/10/2013 20:41    Labs:  Basic Metabolic Panel:  Recent Labs Lab 07/08/13 0520  NA 142  K 3.7  CL 105  CO2 27  GLUCOSE 96  BUN 14  CREATININE 0.97  CALCIUM 8.6    CBC:  Recent Labs Lab 07/08/13 0520 07/09/13 0850 07/11/13 0550  WBC 9.0 8.6 8.3  HGB 9.7* 9.7* 9.7*  HCT 30.3* 30.8* 30.2*  MCV 86.8 86.8 86.8  PLT 633* 653* 562*    CBG:  Recent Labs Lab 07/10/13 0729 07/10/13 1118 07/10/13 1613 07/10/13 2056 07/11/13 0714  GLUCAP 83 107* 121* 111* 96    Recent Protime:   Recent Results (from the past 2160 hour(s))    07/04/13  7:30 AM      Result Value Range   Prothrombin Time 21.6 (*) 11.6 - 15.2 seconds   INR 1.94 (*) 0.00 - 1.49  PROTIME-INR     Status: Abnormal    Collection Time    07/05/13  5:25 AM      Result Value Range   Prothrombin Time 22.8 (*) 11.6 - 15.2 seconds   INR 2.09 (*) 0.00 - 1.49  PROTIME-INR     Status: Abnormal   Collection Time    07/06/13  6:00 AM      Result Value Range   Prothrombin Time 24.9 (*) 11.6 - 15.2 seconds   INR 2.34 (*) 0.00 - 1.49  PROTIME-INR     Status: Abnormal   Collection Time    07/07/13  6:05 AM      Result Value Range   Prothrombin Time 26.0 (*) 11.6 - 15.2 seconds   INR 2.48 (*) 0.00 - 1.49  PROTIME-INR     Status: Abnormal   Collection Time    07/08/13  5:20 AM      Result Value Range   Prothrombin Time 28.6 (*) 11.6 - 15.2 seconds   INR 2.81 (*) 0.00 - 1.49  PROTIME-INR     Status: Abnormal   Collection Time    07/09/13  5:30 AM      Result Value Range   Prothrombin Time 28.3 (*) 11.6 - 15.2 seconds   INR 2.77 (*) 0.00 - 1.49  PROTIME-INR  Status: Abnormal   Collection Time    07/10/13  5:38 AM      Result Value Range   Prothrombin Time 25.8 (*) 11.6 - 15.2 seconds   INR 2.45 (*) 0.00 - 1.49  OCCULT BLOOD X 1 CARD TO LAB, STOOL     Status: Abnormal   Collection Time    07/10/13 12:36 PM      Result Value Range   Fecal Occult Bld POSITIVE (*) NEGATIVE  PROTIME-INR     Status: Abnormal   Collection Time    07/11/13  5:50 AM      Result Value Range   Prothrombin Time 25.7 (*) 11.6 - 15.2 seconds   INR 2.44 (*) 0.00 - 1.49    Brief HPI:   [name] [name] is a 57 y.o. female with history of severe PVD with FPBG and RLL thrombectomy at [facility] hospital few days PTA on 06/21/13 with ischemic RLE with foot pain and numbness as well as black stools with hgb of 7.6. She underwent EGD by Dr. Madilyn Fireman and this was negative for source of bleeding with recommendations to monitor H/H and colonoscopy is indicated. She was evaluated by Dr. 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  She continued to have melenotic stools and was transfused on 09/06 for drop in H/H to 7.0.  Dr. Madilyn Fireman recommends  colonoscopy once recovered from surgery sooner if bleeding recurs.  Therapies ongoing and patient continues to be limited by pain as well as high levels of anxiety. Therapy team recommended CIR for progressive therapies.    Hospital Course: [name] [name] was admitted to rehab 07/01/2013 for inpatient therapies to consist of PT and OT at least three hours five days a week. Past admission physiatrist, therapy team and rehab RN have worked together to provide customized collaborative inpatient rehab. Vitals were monitored on bid basis. She has been afebrile and blood pressures have been controlled off medications. Her po intake has slowly improved therefore onglyza was resumed. Her hgb has been monitored and has had drop from 10.8 to 9.7. She has had one episode of heme positive stools but no overt signs of bleeding.  Repeat CBC to be done on 09/22 with results to Dr. Welton Flakes.  Pain control has been an issue and she was started on oxycontin for more consistent relief. She has also been educated on desensitization techniques. She had significant edema with blistering at medial and lateral edges of wound at time of admission.  This  has improved with compressive wraps and no skin loss noted. She continues to have min to moderate amount of sero-sanguinous drainage from this area and has been educated on continuing dressing changes 1-2 times a day to prevent maceration.   She did sustain a fall due to standing without use of her walker.  Safety issues were reiterated. X rays of right hip and pelvis were negative for fracture. Wound is intact with staples in place. She is to follow with Dr. Edilia Bo for post op check next week. She is advised to monitor for any signs of bleeding due to her recent GI issues and ongoing coumadin use. She will continue to receive HHPT, OT and RN by [facility] past discharge.   Rehab course: During patient's stay in rehab weekly team conferences were held to monitor patient's progress,  set goals and discuss barriers to discharge. Patient has had improvement in activity tolerance, balance, postural control, as well as ability to compensate for deficits. She is modified independent for bathing,  dressing and tolieting tasks. She is able to ambulate 103' with RW and supervision. She is modified independent for transfers and wheelchair mobility.    Disposition: Home   Diet: Diabetic diet.  Special Instructions: 1. NO SMOKING. 2. Contact Dr. Welton Flakes for any signs of bleeding or dark stools.  3. Use ice pack on right hip 2-3 times a day.  4. HHRN to draw protime and CBC on 09/22 with results to Dr. Valrie Hart.        Future Appointments Provider Department Dept Phone   07/16/2013 1:30 PM Chuck Hint, MD [facility] [phone]   08/08/2013 11:00 AM Ranelle Oyster, MD [facility] [phone]       Medication List    STOP taking these medications       EDARBYCLOR 40-12.5 MG Tabs  Generic drug:  Azilsartan-Chlorthalidone     oxyCODONE-acetaminophen 5-325 MG per tablet  Commonly known as:  PERCOCET/ROXICET      TAKE these medications       acetaminophen 325 MG tablet  Commonly known as:  TYLENOL  Take 1-2 tablets (325-650 mg total) by mouth every 4 (four) hours as needed.     allopurinol 100 MG tablet  Commonly known as:  ZYLOPRIM  Take 100 mg by mouth daily.     amitriptyline 50 MG tablet  Commonly known as:  ELAVIL  Take 50 mg by mouth at bedtime.     DULoxetine 60 MG capsule  Commonly known as:  CYMBALTA  Take 60 mg by mouth daily.     FUSION PLUS PO  Take 1 capsule by mouth every morning.     methocarbamol 500 MG tablet  Commonly known as:  ROBAXIN  Take 1 tablet (500 mg total) by mouth 4 (four) times daily.     nicotine polacrilex 2 MG gum  Commonly known as:  NICORETTE  Take 1 each (2 mg total) by mouth as needed for smoking cessation.     oxyCODONE 15 MG immediate  release tablet  Rx# 120 pills  Commonly known as:  ROXICODONE  Take 1 tablet (15 mg total) by mouth every 6 (six) hours as needed.     OxyCODONE 20 mg T12a 12 hr tablet Rx # 60 pills  Commonly known as:  OXYCONTIN  Take 1 tablet (20 mg total) by mouth every 12 (twelve) hours.     pantoprazole 40 MG tablet  Commonly known as:  PROTONIX  Take 1 tablet (40 mg total) by mouth 2 (two) times daily.     pregabalin 300 MG capsule  Commonly known as:  LYRICA  Take 300 mg by mouth 2 (two) times daily.     rosuvastatin 20 MG tablet  Commonly known as:  CRESTOR  Take 20 mg by mouth daily.     saxagliptin HCl 2.5 MG Tabs tablet  Commonly known as:  ONGLYZA  Take 2.5 mg by mouth daily.     senna-docusate 8.6-50 MG per tablet  Commonly known as:  Senokot-S  Take 2 tablets by mouth 2 (two) times daily.     warfarin 2 MG tablet  Commonly known as:  COUMADIN  Take 1.5 tablets (3 mg total) by mouth daily. Take with supper       Follow-up Information   Follow up with Ranelle Oyster, MD On 07/24/2013. (Be there at 10:30  for 11 an   appointment)    Specialty:  Physical Medicine and Rehabilitation   Contact  information:   [address], [address] [place] [address] [phone]       Follow up with Chuck Hint, MD On 07/16/2013. (Be there at 1:15 pm for post op check and staple removal.)    Specialty:  Vascular Surgery   Contact information:   [address] [place] [address] [phone]       Follow up with Yves Dill, MD On 07/17/2013. (be there at 10:45 am for post op check and protime monitoring. Recheck CBC for stability. )    Specialty:  Internal Medicine   Contact information:   [address] [place] [address] [phone]       Follow up with HAYES,JOHN C, MD. Call in 3 days. (Follow up on GIB and  for colonoscopy.  )    Specialty:  Gastroenterology   Contact information:   [address]                         [place] [address] [phone]       Signed: Jacquelynn Cree 07/11/2013, 11:44 AM

## 2013-07-11 NOTE — Progress Notes (Signed)
Social Work Discharge Note Discharge Note  The overall goal for the admission was met for:   Discharge location: Yes-HOME-EXTENDED FAMILY AND FRIENDS TO CHECK ON HER  Length of Stay: Yes-10 DAYS  Discharge activity level: Yes-MOD/I WHEELCHAIR LEVEL  Home/community participation: Yes  Services provided included: MD, RD, PT, OT, RN, Pharmacy and SW  Financial Services: Private Insurance: [employer]  Follow-up services arranged: Home Health: [facility]-PT,OT,RN, DME: [facility]-WHEELCHAIR and Patient/Family has no preference for HH/DME agencies  Comments (or additional information):MET GOALS AND PT FEELS READY FOR DISCHARGE  Patient/Family verbalized understanding of follow-up arrangements: Yes  Individual responsible for coordination of the follow-up plan: SELF  Confirmed correct DME delivered: Lucy Chris 07/11/2013    Lucy Chris

## 2013-07-15 ENCOUNTER — Encounter: Payer: Self-pay | Admitting: Vascular Surgery

## 2013-07-16 ENCOUNTER — Encounter: Payer: PRIVATE HEALTH INSURANCE | Admitting: Vascular Surgery

## 2013-07-24 ENCOUNTER — Inpatient Hospital Stay: Payer: Self-pay | Admitting: Internal Medicine

## 2013-07-24 LAB — COMPREHENSIVE METABOLIC PANEL
Albumin: 2.5 g/dL — ABNORMAL LOW (ref 3.4–5.0)
Alkaline Phosphatase: 97 U/L (ref 50–136)
Calcium, Total: 9.3 mg/dL (ref 8.5–10.1)
EGFR (African American): >60
Glucose: 102 mg/dL — ABNORMAL HIGH (ref 65–99)
Osmolality: 277 (ref 275–301)
Potassium: 3.4 mmol/L — ABNORMAL LOW (ref 3.5–5.1)
SGOT(AST): 27 U/L (ref 15–37)
SGPT (ALT): 23 U/L (ref 12–78)

## 2013-07-24 LAB — CBC
HCT: 35.4 % (ref 35.0–47.0)
MCH: 27.6 pg (ref 26.0–34.0)
MCV: 84 fL (ref 80–100)
Platelet: 337 10*3/uL (ref 150–440)
RBC: 4.21 10*6/uL (ref 3.80–5.20)
WBC: 11.7 10*3/uL — ABNORMAL HIGH (ref 3.6–11.0)

## 2013-07-25 LAB — BASIC METABOLIC PANEL
Anion Gap: 6 — ABNORMAL LOW (ref 7–16)
BUN: 11 mg/dL (ref 7–18)
Calcium, Total: 8.6 mg/dL (ref 8.5–10.1)
EGFR (Non-African Amer.): 53 — ABNORMAL LOW
Glucose: 89 mg/dL (ref 65–99)
Osmolality: 280 (ref 275–301)
Sodium: 141 mmol/L (ref 136–145)

## 2013-07-25 LAB — CBC WITH DIFFERENTIAL/PLATELET
Basophil #: 0.1 10*3/uL (ref 0.0–0.1)
Basophil %: 0.6 %
Eosinophil #: 0.1 10*3/uL (ref 0.0–0.7)
HGB: 11 g/dL — ABNORMAL LOW (ref 12.0–16.0)
Lymphocyte #: 1.4 10*3/uL (ref 1.0–3.6)
MCH: 28.3 pg (ref 26.0–34.0)
MCV: 84 fL (ref 80–100)
Monocyte #: 1.1 x10 3/mm — ABNORMAL HIGH (ref 0.2–0.9)
Platelet: 293 10*3/uL (ref 150–440)
RBC: 3.88 10*6/uL (ref 3.80–5.20)

## 2013-07-26 LAB — VANCOMYCIN, TROUGH: Vancomycin, Trough: 9 ug/mL — ABNORMAL LOW (ref 10–20)

## 2013-07-27 LAB — URINALYSIS, COMPLETE
Bacteria: NONE SEEN
Bilirubin,UR: NEGATIVE
Blood: NEGATIVE
Glucose,UR: NEGATIVE mg/dL (ref 0–75)
Ketone: NEGATIVE
Leukocyte Esterase: NEGATIVE
Nitrite: NEGATIVE
Ph: 5 (ref 4.5–8.0)
Specific Gravity: 1.01 (ref 1.003–1.030)
Squamous Epithelial: 2

## 2013-07-27 LAB — CBC WITH DIFFERENTIAL/PLATELET
Basophil %: 0.5 %
Eosinophil %: 2.2 %
Lymphocyte #: 1.9 10*3/uL (ref 1.0–3.6)
Lymphocyte %: 20.7 %
MCH: 28.3 pg (ref 26.0–34.0)
Monocyte #: 0.9 x10 3/mm (ref 0.2–0.9)
Neutrophil #: 6 10*3/uL (ref 1.4–6.5)
Neutrophil %: 66.7 %
Platelet: 285 10*3/uL (ref 150–440)
RBC: 3.87 10*6/uL (ref 3.80–5.20)
RDW: 16.3 % — ABNORMAL HIGH (ref 11.5–14.5)
WBC: 9 10*3/uL (ref 3.6–11.0)

## 2013-07-28 LAB — VANCOMYCIN, TROUGH: Vancomycin, Trough: 11 ug/mL (ref 10–20)

## 2013-07-29 LAB — CULTURE, BLOOD (SINGLE)

## 2013-07-30 ENCOUNTER — Encounter: Payer: PRIVATE HEALTH INSURANCE | Admitting: Vascular Surgery

## 2013-07-30 LAB — CBC WITH DIFFERENTIAL/PLATELET
Basophil #: 0 10*3/uL (ref 0.0–0.1)
Basophil %: 0.7 %
Eosinophil #: 0.4 10*3/uL (ref 0.0–0.7)
Eosinophil %: 5.2 %
HCT: 33.3 % — ABNORMAL LOW (ref 35.0–47.0)
Lymphocyte #: 1.9 10*3/uL (ref 1.0–3.6)
Lymphocyte %: 27.7 %
MCH: 27.9 pg (ref 26.0–34.0)
MCHC: 33.5 g/dL (ref 32.0–36.0)
MCV: 83 fL (ref 80–100)
Neutrophil %: 54.6 %
RBC: 4.01 10*6/uL (ref 3.80–5.20)
WBC: 6.8 10*3/uL (ref 3.6–11.0)

## 2013-07-30 LAB — CREATININE, SERUM: EGFR (African American): 57 — ABNORMAL LOW

## 2013-07-30 LAB — WOUND CULTURE

## 2013-07-31 LAB — PATHOLOGY REPORT

## 2013-08-01 LAB — CREATININE, SERUM
EGFR (African American): 52 — ABNORMAL LOW
EGFR (Non-African Amer.): 45 — ABNORMAL LOW

## 2013-08-02 LAB — CBC WITH DIFFERENTIAL/PLATELET
Basophil #: 0 10*3/uL (ref 0.0–0.1)
Basophil %: 0.6 %
Eosinophil #: 0.4 10*3/uL (ref 0.0–0.7)
HCT: 32.8 % — ABNORMAL LOW (ref 35.0–47.0)
Lymphocyte #: 2 10*3/uL (ref 1.0–3.6)
MCHC: 33.1 g/dL (ref 32.0–36.0)
MCV: 84 fL (ref 80–100)
Monocyte #: 0.9 x10 3/mm (ref 0.2–0.9)
Monocyte %: 12.9 %
Neutrophil #: 3.5 10*3/uL (ref 1.4–6.5)
Neutrophil %: 50.4 %
Platelet: 416 10*3/uL (ref 150–440)
RDW: 16.6 % — ABNORMAL HIGH (ref 11.5–14.5)
WBC: 6.9 10*3/uL (ref 3.6–11.0)

## 2013-08-02 LAB — BASIC METABOLIC PANEL
Calcium, Total: 9.3 mg/dL (ref 8.5–10.1)
Chloride: 105 mmol/L (ref 98–107)
Co2: 32 mmol/L (ref 21–32)
Creatinine: 1.28 mg/dL (ref 0.60–1.30)
EGFR (Non-African Amer.): 46 — ABNORMAL LOW
Glucose: 148 mg/dL — ABNORMAL HIGH (ref 65–99)
Sodium: 141 mmol/L (ref 136–145)

## 2013-08-03 LAB — WOUND CULTURE

## 2013-08-08 ENCOUNTER — Encounter
Payer: PRIVATE HEALTH INSURANCE | Attending: Physical Medicine & Rehabilitation | Admitting: Physical Medicine & Rehabilitation

## 2013-08-08 ENCOUNTER — Telehealth: Payer: Self-pay | Admitting: Vascular Surgery

## 2013-08-08 NOTE — Telephone Encounter (Signed)
Working recall list. Pt cancelled post op - was still in hospital. [name] [name] to inpatient rehab and was supposed to follow up with another rehab. When I called the pt's phone number, the answering machine only says "HELP". Called contact person to get patient's location so we can check to see how she is and if she needs any follow up. [name] did not answer. LVM.

## 2013-08-09 LAB — CBC WITH DIFFERENTIAL/PLATELET
Basophil %: 0.7 %
HCT: 31.4 % — ABNORMAL LOW (ref 35.0–47.0)
HGB: 10.3 g/dL — ABNORMAL LOW (ref 12.0–16.0)
Lymphocyte %: 35.6 %
MCH: 27.6 pg (ref 26.0–34.0)
MCHC: 32.8 g/dL (ref 32.0–36.0)
MCV: 84 fL (ref 80–100)
Monocyte %: 7.6 %
Neutrophil %: 46.4 %
RDW: 16.6 % — ABNORMAL HIGH (ref 11.5–14.5)

## 2013-08-09 LAB — BASIC METABOLIC PANEL
Anion Gap: 5 — ABNORMAL LOW (ref 7–16)
BUN: 12 mg/dL (ref 7–18)
Calcium, Total: 9.4 mg/dL (ref 8.5–10.1)
Chloride: 105 mmol/L (ref 98–107)
Creatinine: 1.25 mg/dL (ref 0.60–1.30)
EGFR (African American): 55 — ABNORMAL LOW
Glucose: 125 mg/dL — ABNORMAL HIGH (ref 65–99)
Osmolality: 282 (ref 275–301)
Potassium: 3.7 mmol/L (ref 3.5–5.1)

## 2013-08-14 ENCOUNTER — Ambulatory Visit: Payer: Self-pay | Admitting: Vascular Surgery

## 2013-08-14 LAB — BASIC METABOLIC PANEL
Anion Gap: 6 — ABNORMAL LOW (ref 7–16)
Chloride: 102 mmol/L (ref 98–107)
Creatinine: 1.41 mg/dL — ABNORMAL HIGH (ref 0.60–1.30)
EGFR (Non-African Amer.): 41 — ABNORMAL LOW
Glucose: 92 mg/dL (ref 65–99)

## 2013-08-14 LAB — CBC
MCHC: 33.2 g/dL (ref 32.0–36.0)
Platelet: 217 10*3/uL (ref 150–440)
RBC: 3.72 10*6/uL — ABNORMAL LOW (ref 3.80–5.20)
RDW: 17.1 % — ABNORMAL HIGH (ref 11.5–14.5)
WBC: 9.8 10*3/uL (ref 3.6–11.0)

## 2013-08-31 ENCOUNTER — Emergency Department: Payer: Self-pay | Admitting: Emergency Medicine

## 2013-08-31 LAB — CBC
HCT: 25.3 % — ABNORMAL LOW (ref 35.0–47.0)
MCH: 26.9 pg (ref 26.0–34.0)
MCV: 81 fL (ref 80–100)
Platelet: 176 10*3/uL (ref 150–440)

## 2013-08-31 LAB — BASIC METABOLIC PANEL
Anion Gap: 5 — ABNORMAL LOW (ref 7–16)
BUN: 16 mg/dL (ref 7–18)
Calcium, Total: 9.2 mg/dL (ref 8.5–10.1)
EGFR (Non-African Amer.): 53 — ABNORMAL LOW
Glucose: 111 mg/dL — ABNORMAL HIGH (ref 65–99)
Osmolality: 281 (ref 275–301)

## 2013-09-15 ENCOUNTER — Inpatient Hospital Stay: Payer: Self-pay | Admitting: Vascular Surgery

## 2013-09-15 LAB — BASIC METABOLIC PANEL
Anion Gap: 8 (ref 7–16)
BUN: 13 mg/dL (ref 7–18)
Calcium, Total: 9.8 mg/dL (ref 8.5–10.1)
Chloride: 104 mmol/L (ref 98–107)
EGFR (Non-African Amer.): >60
Osmolality: 280 (ref 275–301)
Potassium: 3.9 mmol/L (ref 3.5–5.1)

## 2013-09-15 LAB — CBC
HGB: 10.3 g/dL — ABNORMAL LOW (ref 12.0–16.0)
MCH: 26.7 pg (ref 26.0–34.0)
RBC: 3.85 10*6/uL (ref 3.80–5.20)
RDW: 18.7 % — ABNORMAL HIGH (ref 11.5–14.5)
WBC: 7.9 10*3/uL (ref 3.6–11.0)

## 2013-09-16 LAB — CBC WITH DIFFERENTIAL/PLATELET
Basophil #: 0 10*3/uL (ref 0.0–0.1)
Basophil %: 0.5 %
Eosinophil #: 0.1 10*3/uL (ref 0.0–0.7)
Eosinophil %: 1.3 %
HCT: 28.8 % — ABNORMAL LOW (ref 35.0–47.0)
Lymphocyte #: 1.5 10*3/uL (ref 1.0–3.6)
MCH: 27.2 pg (ref 26.0–34.0)
MCHC: 33.3 g/dL (ref 32.0–36.0)
Monocyte #: 1.1 x10 3/mm — ABNORMAL HIGH (ref 0.2–0.9)
Neutrophil #: 7.8 10*3/uL — ABNORMAL HIGH (ref 1.4–6.5)
Platelet: 312 10*3/uL (ref 150–440)
RDW: 18.5 % — ABNORMAL HIGH (ref 11.5–14.5)

## 2013-09-16 LAB — BASIC METABOLIC PANEL
Anion Gap: 4 — ABNORMAL LOW (ref 7–16)
Chloride: 104 mmol/L (ref 98–107)
Co2: 30 mmol/L (ref 21–32)
EGFR (African American): >60
EGFR (Non-African Amer.): 57 — ABNORMAL LOW
Glucose: 147 mg/dL — ABNORMAL HIGH (ref 65–99)
Osmolality: 278 (ref 275–301)
Sodium: 138 mmol/L (ref 136–145)

## 2013-09-24 ENCOUNTER — Inpatient Hospital Stay: Payer: Self-pay | Admitting: Internal Medicine

## 2013-09-24 LAB — BASIC METABOLIC PANEL
Anion Gap: 10 (ref 7–16)
Anion Gap: 9 (ref 7–16)
BUN: 47 mg/dL — ABNORMAL HIGH (ref 7–18)
BUN: 68 mg/dL — ABNORMAL HIGH (ref 7–18)
Calcium, Total: 9.1 mg/dL (ref 8.5–10.1)
Chloride: 100 mmol/L (ref 98–107)
Chloride: 108 mmol/L — ABNORMAL HIGH (ref 98–107)
Co2: 23 mmol/L (ref 21–32)
Co2: 26 mmol/L (ref 21–32)
Creatinine: 3.89 mg/dL — ABNORMAL HIGH (ref 0.60–1.30)
EGFR (African American): 14 — ABNORMAL LOW
EGFR (African American): 27 — ABNORMAL LOW
EGFR (Non-African Amer.): 12 — ABNORMAL LOW
Glucose: 129 mg/dL — ABNORMAL HIGH (ref 65–99)
Glucose: 146 mg/dL — ABNORMAL HIGH (ref 65–99)
Osmolality: 293 (ref 275–301)
Osmolality: 294 (ref 275–301)
Potassium: 3.3 mmol/L — ABNORMAL LOW (ref 3.5–5.1)
Sodium: 136 mmol/L (ref 136–145)
Sodium: 140 mmol/L (ref 136–145)

## 2013-09-24 LAB — TROPONIN I: Troponin-I: <0.02 ng/mL

## 2013-09-24 LAB — DRUG SCREEN, URINE
Amphetamines, Ur Screen: NEGATIVE (ref ?–1000)
Barbiturates, Ur Screen: NEGATIVE (ref ?–200)
Benzodiazepine, Ur Scrn: NEGATIVE (ref ?–200)
Phencyclidine (PCP) Ur S: NEGATIVE (ref ?–25)

## 2013-09-24 LAB — CBC WITH DIFFERENTIAL/PLATELET
Basophil #: 0 10*3/uL (ref 0.0–0.1)
Basophil %: 0.3 %
Eosinophil #: 0.1 10*3/uL (ref 0.0–0.7)
HCT: 22.6 % — ABNORMAL LOW (ref 35.0–47.0)
HGB: 7.1 g/dL — ABNORMAL LOW (ref 12.0–16.0)
MCV: 81 fL (ref 80–100)
Monocyte #: 0.9 x10 3/mm (ref 0.2–0.9)
Neutrophil #: 12.1 10*3/uL — ABNORMAL HIGH (ref 1.4–6.5)
Neutrophil %: 84.6 %
Platelet: 391 10*3/uL (ref 150–440)
RDW: 18.3 % — ABNORMAL HIGH (ref 11.5–14.5)
WBC: 14.3 10*3/uL — ABNORMAL HIGH (ref 3.6–11.0)

## 2013-09-24 LAB — URINALYSIS, COMPLETE
Ketone: NEGATIVE
Leukocyte Esterase: NEGATIVE
Ph: 5 (ref 4.5–8.0)
RBC,UR: 2 /HPF (ref 0–5)
Specific Gravity: 1.018 (ref 1.003–1.030)
Squamous Epithelial: 3
WBC UR: 2 /HPF (ref 0–5)

## 2013-09-24 LAB — CK TOTAL AND CKMB (NOT AT ARMC)
CK, Total: 341 U/L — ABNORMAL HIGH (ref 21–215)
CK, Total: 304 U/L — ABNORMAL HIGH (ref 21–215)
CK-MB: 3.5 ng/mL (ref 0.5–3.6)

## 2013-09-24 LAB — CBC
HGB: 8.6 g/dL — ABNORMAL LOW (ref 12.0–16.0)
MCH: 25.8 pg — ABNORMAL LOW (ref 26.0–34.0)
MCHC: 32.1 g/dL (ref 32.0–36.0)
WBC: 14.6 10*3/uL — ABNORMAL HIGH (ref 3.6–11.0)

## 2013-09-24 LAB — HEPATIC FUNCTION PANEL A (ARMC)
Albumin: 2.2 g/dL — ABNORMAL LOW (ref 3.4–5.0)
Bilirubin,Total: 0.6 mg/dL (ref 0.2–1.0)
Total Protein: 5.6 g/dL — ABNORMAL LOW (ref 6.4–8.2)

## 2013-09-24 LAB — MAGNESIUM: Magnesium: 2 mg/dL

## 2013-09-25 LAB — URINE CULTURE

## 2013-09-25 LAB — CBC WITH DIFFERENTIAL/PLATELET
Basophil #: 0 10*3/uL (ref 0.0–0.1)
Eosinophil #: 0.2 10*3/uL (ref 0.0–0.7)
HGB: 10.4 g/dL — ABNORMAL LOW (ref 12.0–16.0)
Lymphocyte %: 3.9 %
MCH: 27.4 pg (ref 26.0–34.0)
MCHC: 33.8 g/dL (ref 32.0–36.0)
MCV: 81 fL (ref 80–100)
Neutrophil #: 11 10*3/uL — ABNORMAL HIGH (ref 1.4–6.5)
Platelet: 391 10*3/uL (ref 150–440)
WBC: 15.6 10*3/uL — ABNORMAL HIGH (ref 3.6–11.0)

## 2013-09-25 LAB — BASIC METABOLIC PANEL
Anion Gap: 7 (ref 7–16)
BUN: 26 mg/dL — ABNORMAL HIGH (ref 7–18)
Calcium, Total: 8.7 mg/dL (ref 8.5–10.1)
Chloride: 113 mmol/L — ABNORMAL HIGH (ref 98–107)
EGFR (African American): 56 — ABNORMAL LOW
EGFR (Non-African Amer.): 48 — ABNORMAL LOW
Glucose: 90 mg/dL (ref 65–99)
Osmolality: 289 (ref 275–301)

## 2013-09-25 LAB — TSH: Thyroid Stimulating Horm: 0.539 u[IU]/mL

## 2013-09-25 LAB — HEMOGLOBIN A1C: Hemoglobin A1C: 5.2 % (ref 4.2–6.3)

## 2013-09-26 LAB — CBC WITH DIFFERENTIAL/PLATELET
Basophil %: 0.8 %
Eosinophil #: 0.1 10*3/uL (ref 0.0–0.7)
Eosinophil %: 0.5 %
HCT: 32.6 % — ABNORMAL LOW (ref 35.0–47.0)
HGB: 11 g/dL — ABNORMAL LOW (ref 12.0–16.0)
Lymphocyte #: 1.2 10*3/uL (ref 1.0–3.6)
Lymphocyte %: 7.7 %
Monocyte #: 1.4 x10 3/mm — ABNORMAL HIGH (ref 0.2–0.9)
Monocyte %: 8.5 %
Neutrophil #: 13.3 10*3/uL — ABNORMAL HIGH (ref 1.4–6.5)
Platelet: 375 10*3/uL (ref 150–440)
RBC: 4.05 10*6/uL (ref 3.80–5.20)
RDW: 17.7 % — ABNORMAL HIGH (ref 11.5–14.5)

## 2013-09-26 LAB — BASIC METABOLIC PANEL
Anion Gap: 10 (ref 7–16)
BUN: 16 mg/dL (ref 7–18)
Chloride: 103 mmol/L (ref 98–107)
Creatinine: 1.15 mg/dL (ref 0.60–1.30)
EGFR (Non-African Amer.): 52 — ABNORMAL LOW
Potassium: 3 mmol/L — ABNORMAL LOW (ref 3.5–5.1)
Sodium: 140 mmol/L (ref 136–145)

## 2013-09-27 LAB — CBC WITH DIFFERENTIAL/PLATELET
Basophil #: 0.1 10*3/uL (ref 0.0–0.1)
Basophil %: 0.6 %
Eosinophil %: 1.6 %
HCT: 33.1 % — ABNORMAL LOW (ref 35.0–47.0)
Lymphocyte #: 1.1 10*3/uL (ref 1.0–3.6)
Monocyte %: 5.7 %
Neutrophil %: 84.6 %
RDW: 18 % — ABNORMAL HIGH (ref 11.5–14.5)
WBC: 15.3 10*3/uL — ABNORMAL HIGH (ref 3.6–11.0)

## 2013-09-27 LAB — BASIC METABOLIC PANEL
BUN: 17 mg/dL (ref 7–18)
Chloride: 104 mmol/L (ref 98–107)
EGFR (African American): >60
Glucose: 95 mg/dL (ref 65–99)
Osmolality: 279 (ref 275–301)

## 2013-09-28 LAB — CBC WITH DIFFERENTIAL/PLATELET
Basophil #: 0.1 10*3/uL (ref 0.0–0.1)
Basophil %: 0.5 %
Eosinophil #: 0.2 10*3/uL (ref 0.0–0.7)
Eosinophil %: 1.9 %
HCT: 34.6 % — ABNORMAL LOW (ref 35.0–47.0)
Lymphocyte #: 1 10*3/uL (ref 1.0–3.6)
Lymphocyte %: 7.8 %
MCHC: 33.3 g/dL (ref 32.0–36.0)
Monocyte #: 0.8 x10 3/mm (ref 0.2–0.9)
Neutrophil %: 83.8 %
Platelet: 265 10*3/uL (ref 150–440)
WBC: 12.7 10*3/uL — ABNORMAL HIGH (ref 3.6–11.0)

## 2013-09-28 LAB — BASIC METABOLIC PANEL
BUN: 19 mg/dL — ABNORMAL HIGH (ref 7–18)
Calcium, Total: 9 mg/dL (ref 8.5–10.1)
Co2: 27 mmol/L (ref 21–32)
Creatinine: 1.47 mg/dL — ABNORMAL HIGH (ref 0.60–1.30)
EGFR (African American): 45 — ABNORMAL LOW
Glucose: 105 mg/dL — ABNORMAL HIGH (ref 65–99)
Osmolality: 276 (ref 275–301)
Sodium: 137 mmol/L (ref 136–145)

## 2013-09-29 LAB — CULTURE, BLOOD (SINGLE)

## 2013-10-29 ENCOUNTER — Encounter: Payer: PRIVATE HEALTH INSURANCE | Admitting: Vascular Surgery

## 2013-11-02 IMAGING — CR DG PELVIS 1-2V
1 series · 1 of 1 positions shown · non-contrast
Comparison: None.

CLINICAL DATA: Right hip pain status post fall

EXAM:
PELVIS - 1-2 VIEW

[t pelvis a.p.]
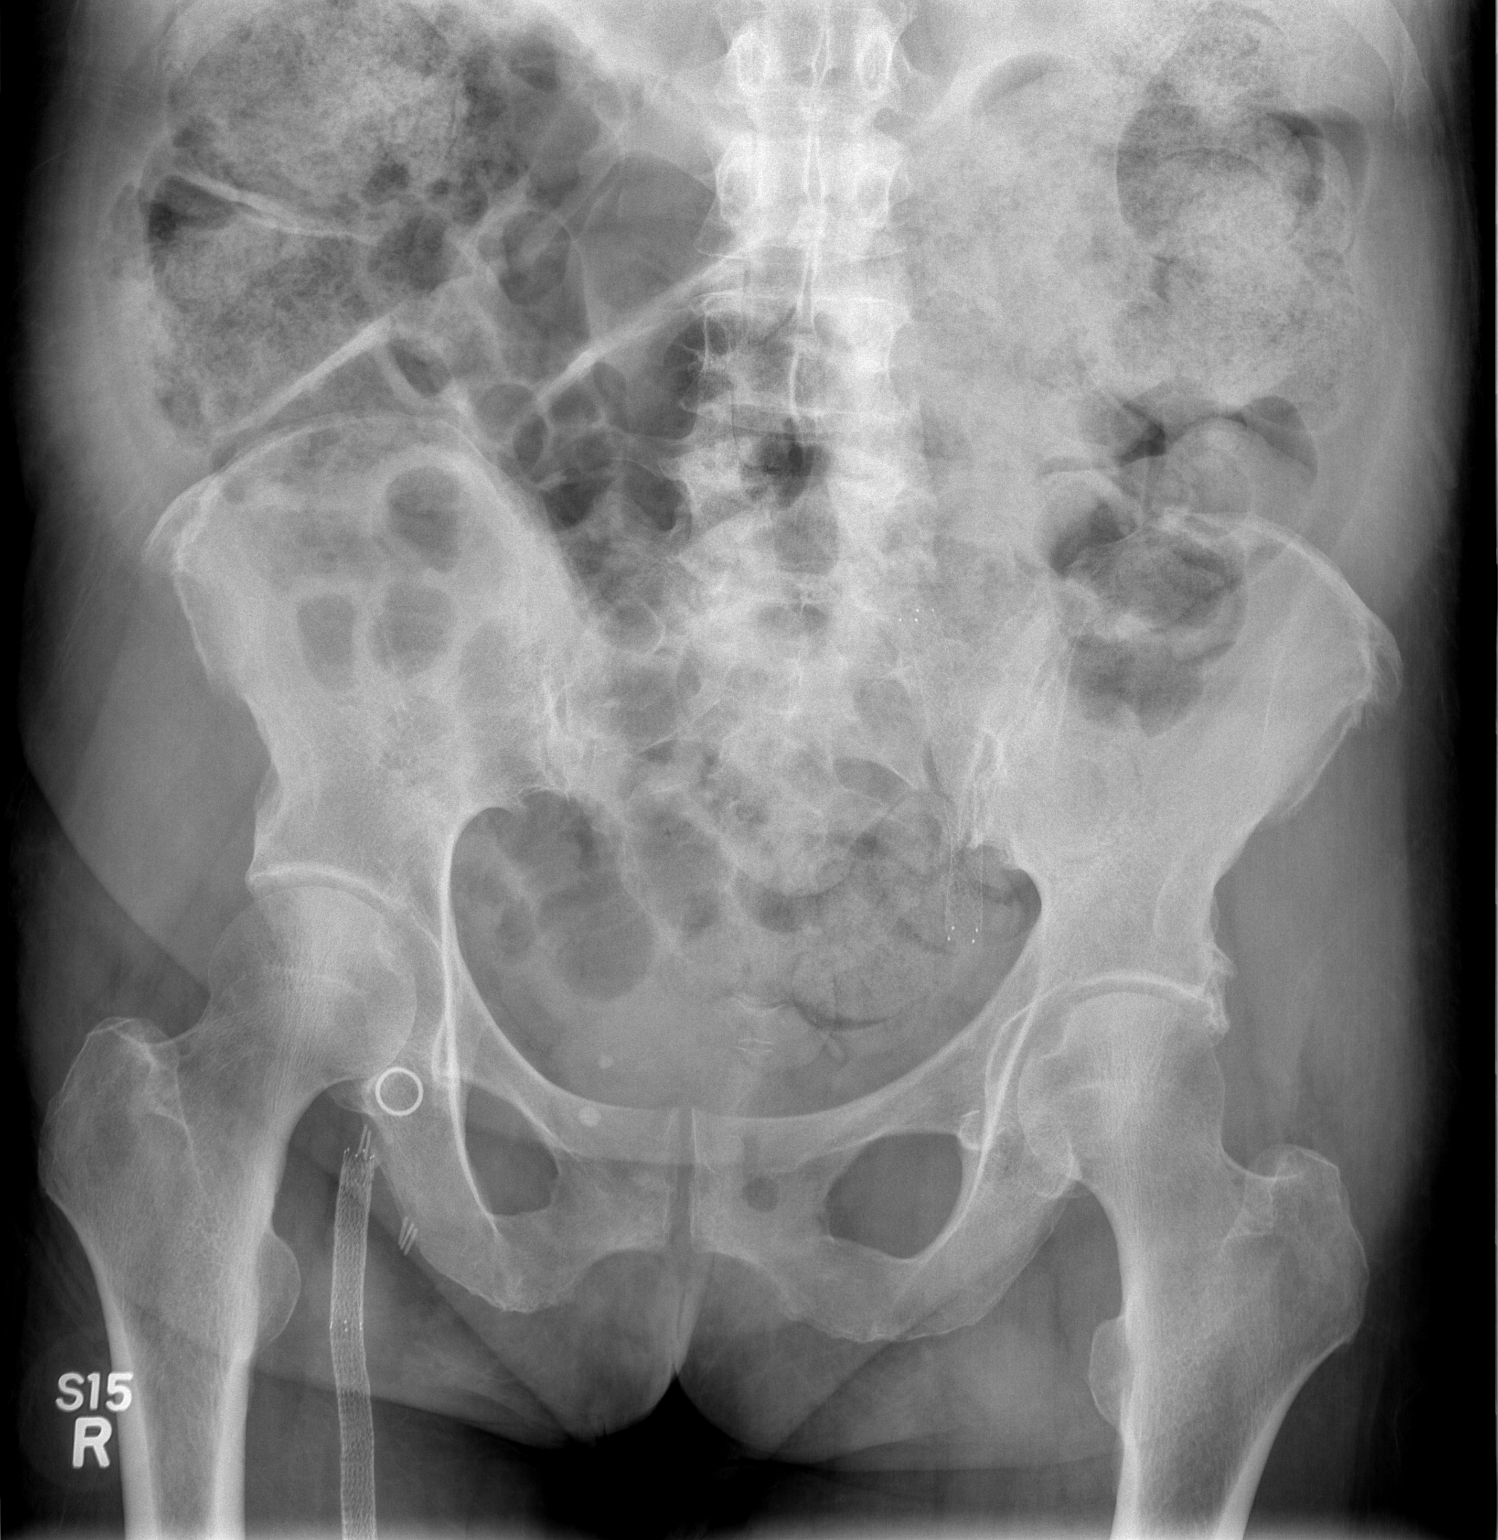

[1 of 1 positions shown; findings below may reference images not displayed]

FINDINGS: No fracture or dislocation is seen.

Visualized bony pelvis appears intact.

Bilateral hip joint spaces are symmetric.

Vascular stents in the left iliac and right groin.

Moderate colonic stool burden.
IMPRESSION: No fracture or dislocation is seen.

## 2013-11-02 IMAGING — CR DG HIP COMPLETE 2+V*R*
2 series · 2 of 2 positions shown · non-contrast
Comparison: None.

CLINICAL DATA: Fall, right hip pain

EXAM:
RIGHT HIP - COMPLETE 2+ VIEW

[t hip ap right]
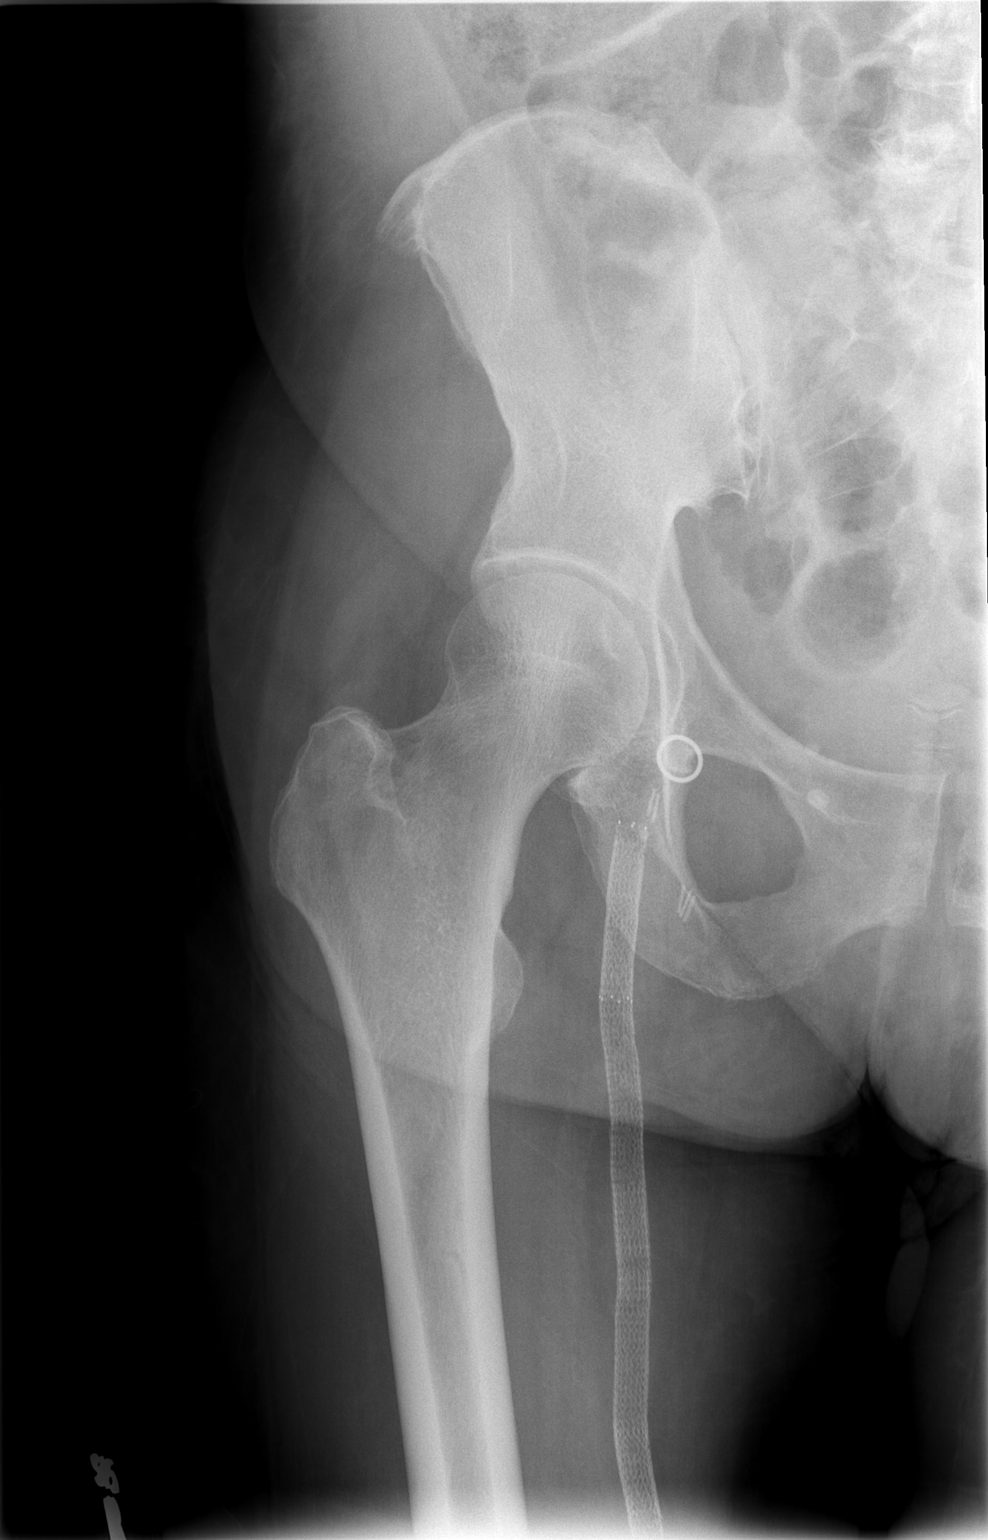

[t hip frog leg right]
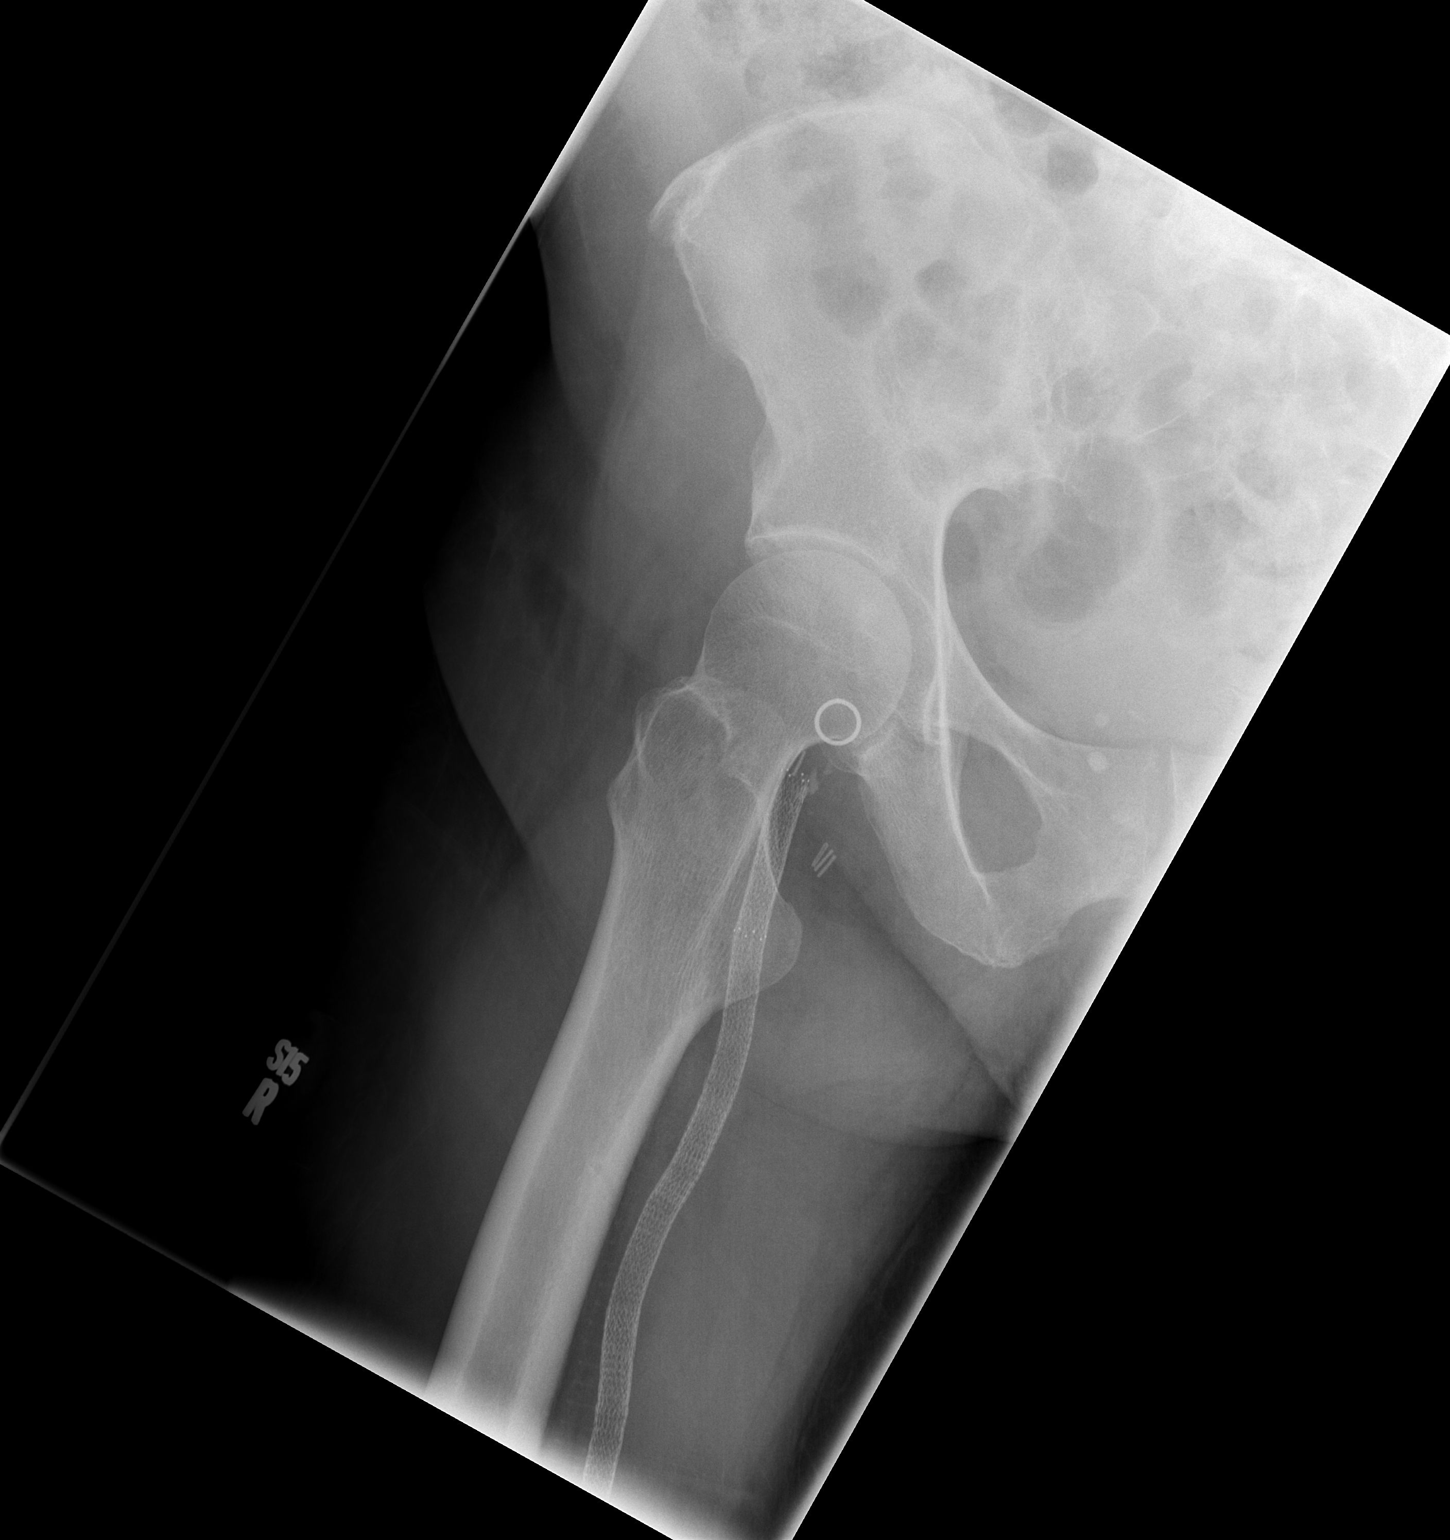

[2 of 2 positions shown; findings below may reference images not displayed]

FINDINGS: No fracture or dislocation is seen.

Right hip joint space is preserved.

Vascular stents.
IMPRESSION: No fracture or dislocation is seen.

## 2014-01-16 IMAGING — CR DG CHEST 1V PORT
1 series · 1 of 1 positions shown · non-contrast
Comparison: 06/21/2013

CLINICAL DATA: Altered mental status.  Hypotension and chest pain.

EXAM:
PORTABLE CHEST - 1 VIEW

[ap]
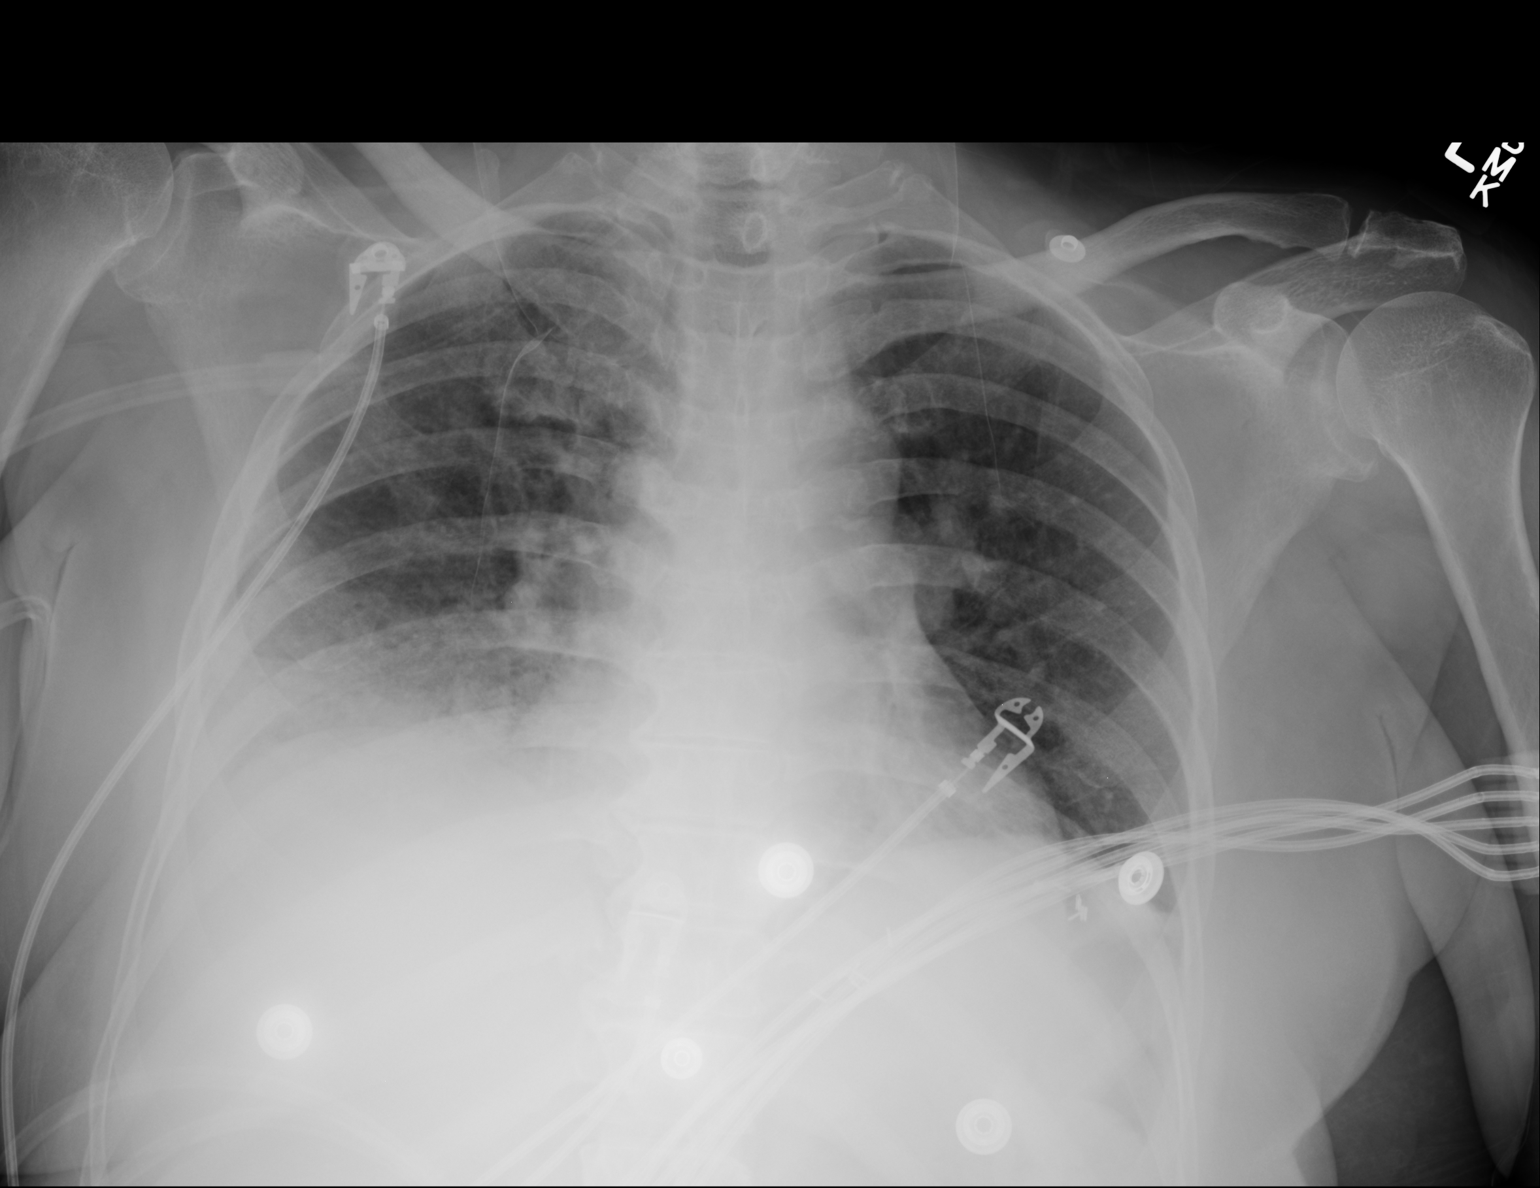

[1 of 1 positions shown; findings below may reference images not displayed]

FINDINGS: Midline trachea. Normal heart size. Small right and probable small
left pleural effusion. Artifact over the upper chest. Low lung
volumes. Mild interstitial edema, greater right than left. Right
greater than left bibasilar airspace disease.
IMPRESSION: Bibasilar airspace disease. Suspicious for infection or aspiration
on the right. Favor atelectasis on the left.

Probable small bilateral pleural effusions.

Underlying interstitial prominence, suspicious for mild interstitial
edema.

## 2014-01-17 IMAGING — CR DG CHEST 1V PORT
1 series · 1 of 1 positions shown · non-contrast
Comparison: 09/23/2013

CLINICAL DATA: Central line placement

EXAM:
PORTABLE CHEST - 1 VIEW

[ap]
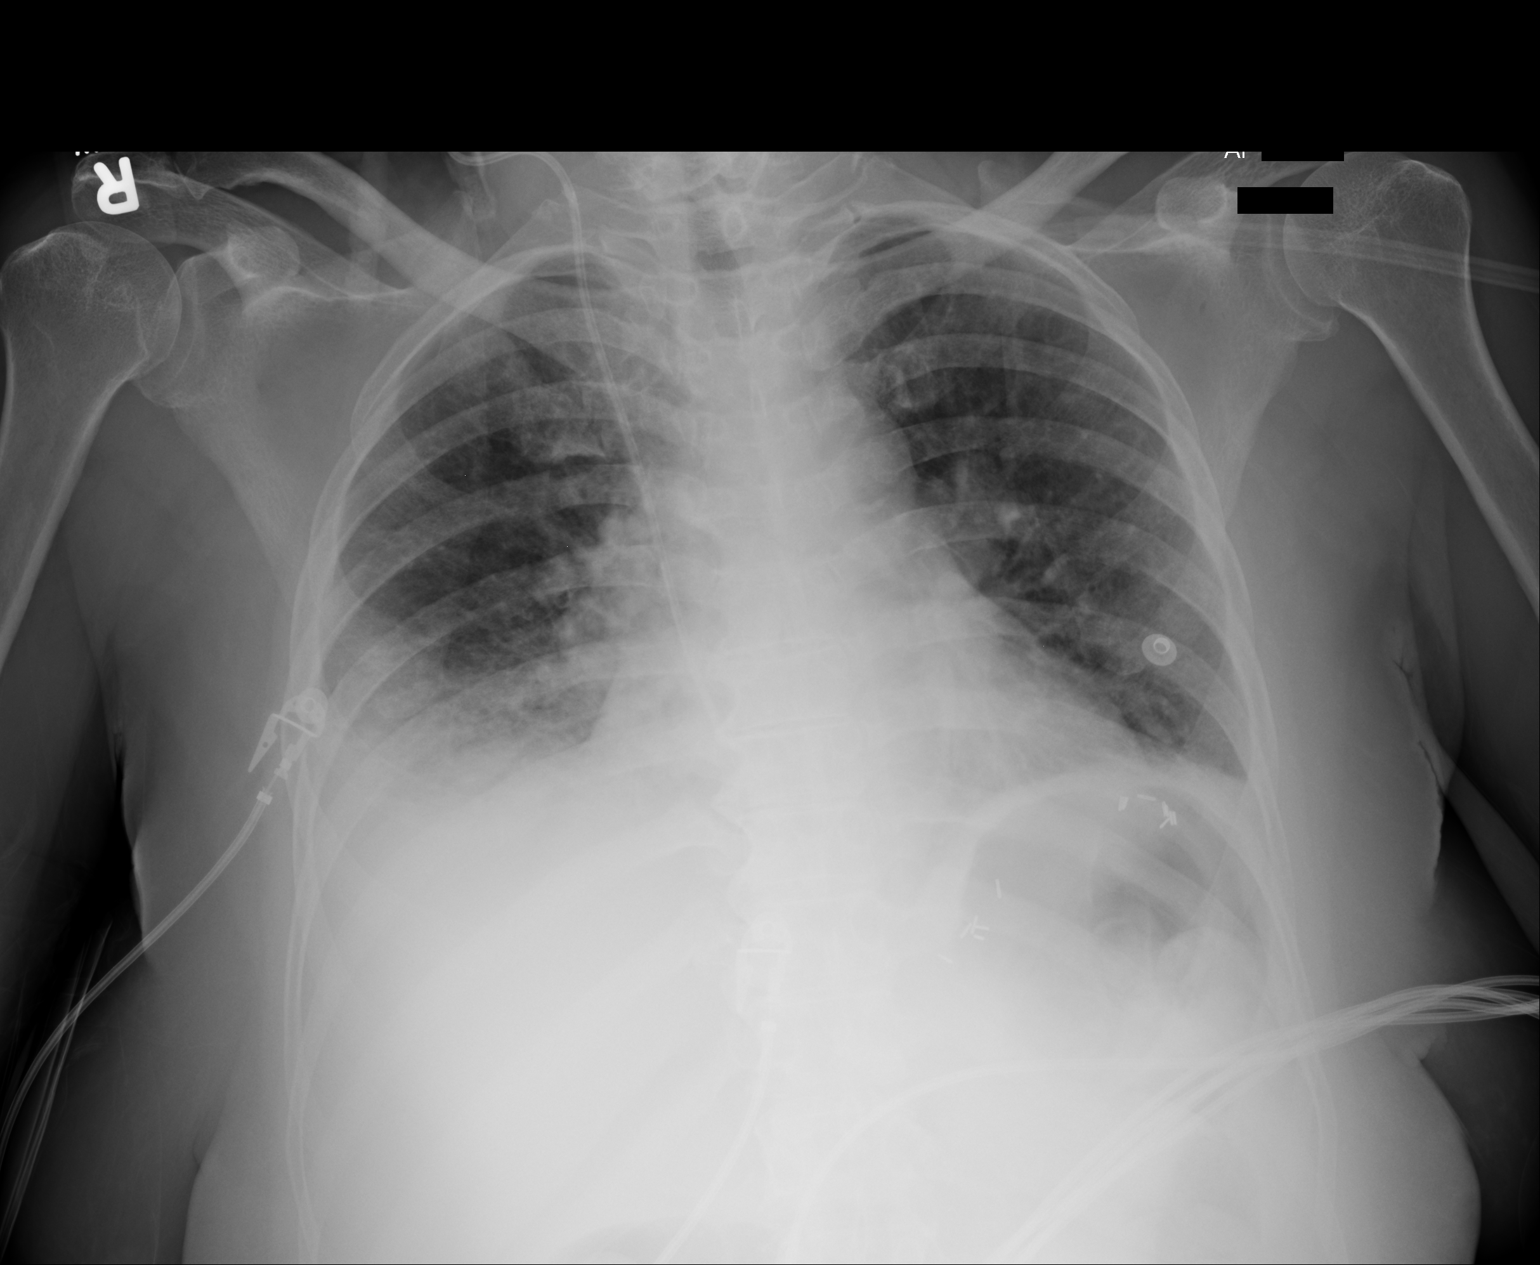

[1 of 1 positions shown; findings below may reference images not displayed]

FINDINGS: Right IJ catheter tip projects over the proximal right atrium. Small
right and trace left pleural effusions with associated airspace
opacities. Interstitial prominence. No pneumothorax.
Cardiomediastinal contours are mildly prominent, similar. Multilevel
degenerative change. Surgical clips left upper quadrant.
IMPRESSION: Right IJ catheter tip projects over the proximal right atrium. No
pneumothorax.

Otherwise, no interval change as above.

## 2014-12-14 ENCOUNTER — Ambulatory Visit: Payer: Self-pay | Admitting: Vascular Surgery

## 2014-12-17 ENCOUNTER — Ambulatory Visit: Payer: Self-pay | Admitting: Vascular Surgery

## 2014-12-23 ENCOUNTER — Ambulatory Visit: Payer: Self-pay | Admitting: Vascular Surgery

## 2015-02-12 NOTE — Discharge Summary (Signed)
PATIENT NAME:  [name] [name], Teletha MR#:  [id number] DATE OF BIRTH:  1956/01/14  DATE OF ADMISSION:  09/24/2013 DATE OF DISCHARGE:  09/28/2013  DISCHARGE DIAGNOSES: 1.  Healthcare-associated pneumonia.  2.  Systemic inflammatory response syndrome.  3.  Peripheral vascular disease, status post above-the-knee amputation.    4.  History of coronary artery disease.  5.  Hypertension.  6.  Diabetes mellitus.   PROCEDURES: None.   IMAGING: A chest x-ray 09/24/2013, right infective process. Right knee x-ray unremarkable.   ADMITTING PHYSICIAN: Ramonita LabAruna Gouru, MD  DISCHARGING PHYSICIAN:  Silas FloodSheikh A. Ellsworth Lennoxejan-Sie, MD  CONSULTATIONS:  Vascular surgery, Dr. Gilda CreaseSchnier.  General surgery, Dr. Michela PitcherEly.   PROCEDURE: Central line placement by Dr. Michela PitcherEly.   HOSPITAL COURSE: This lady was admitted through the Emergency Room where she presented in a state of septic shock with altered mental status. Please review history and physical for full details. The patient was transferred to [facility] following an above-knee amputation following a right below-knee stump infection. Her x-ray revealed a new infiltrate with fever and severe hypotension with tachycardia and marked elevation in her creatinine. The patient was admitted to the Intensive Care Unit. She received intravenous inotropes, high-rate intravenous fluids, multiple boluses. She responded well to this and she was weaned off her inotropes within about 36 hours. Her creatinine normalized within 48 hours. Patient's mental status improved within 12 hours of admission. By the second day of admission, she was fully awake, alert and oriented x 3. Creatinine normalized on her third day of admission.  I restarted her on hydrochlorothiazide for her hypotension, resulted in a slight bump in her creatinine; however, the patient remained nonoliguric throughout her stay. The patient's white cell count also improved, on the day of discharge it was down to 12,000. I changed antibiotics  to oral for discharge.  Per vascular surgery evaluation, there was no evidence of stump infection so her SIRS was attributed to her pneumonia.   The patient's other comorbidities remained fairly stable. She was hypertensive and, as mentioned before, hydrochlorothiazide was initiated in addition to valsartan as her home medications was not on formulary in the hospital. The patient received standard DVT and GI prophylaxis and the remainder of her stay was otherwise uncomplicated.   DIET: Low-sodium, low-fat, ADA diet.   ACTIVITY: As tolerated.   FOLLOWUP: Follow up with Dr. Gilda CreaseSchnier in 1 to 2 weeks and Dr. Yves DillNeelam Khan in 1 to 2 weeks after discharge from skilled nursing facility.   DISCHARGE PROCESS TIME SPENT: 33 minutes.    ____________________________ Silas FloodSheikh A. Ellsworth Lennoxejan-Sie, MD sat:cs D: 09/28/2013 12:30:00 ET T: 09/28/2013 20:00:23 ET JOB#: [id number]  cc: Marland McalpineSheikh A. Ellsworth Lennoxejan-Sie, MD, <Dictator> Charlesetta GaribaldiSHEIKH A TEJAN-SIE MD ELECTRONICALLY SIGNED 10/04/2013 12:09

## 2015-02-12 NOTE — Discharge Summary (Signed)
PATIENT NAME:  [name] [name], Shayana MR#:  [id number] DATE OF BIRTH:  [birth year]  DATE OF ADMISSION:  05/27/2013 DATE OF DISCHARGE:  05/30/2013  ADMITTING DIAGNOSES:   1.  Acute right lower extremity ischemia with occluded femoral-popliteal bypass with rest pain.  2.  Neuropathy.  3.  Hyperlipidemia.   DISCHARGE DIAGNOSES: 1.  Acute right lower extremity ischemia with occluded femoral-popliteal bypass with rest pain.  2.  Neuropathy.  3.  Hyperlipidemia.   SECONDARY DIAGNOSES: 1.  Diabetes 2.  Hypertension.   PROCEDURE PERFORMED WHILE IN THE HOSPITAL: Percutaneous thrombolysis and percutaneous revascularization of right lower extremity to reopen occluded bypass both on 05/27/2013 and 05/28/2013.   BRIEF HISTORY: A 59 year old African American female with extensive peripheral vascular disease who has undergone a previous femoral popliteal bypass graft. She was on Coumadin but was subtherapeutic and there was question of whether or not she was compliant. On presentation she had a clearly occluded graft with no palpable distal pulses and a cool right lower extremity. Her neurologic function was intact. She anticoagulated and prepared for an attempt at percutaneous revascularization.   HOSPITAL COURSE: The was admitted to the hospital and on the same day taken to the vascular interventional radiology suite where a thrombolysis catheter was placed. She ran overnight thrombolysis in the critical care unit and next day came back for a second look angiography. This demonstrated resolution of the lysis with a significant stenosis of the proximal bypass anastomosis. This was treated with angioplasty. She was then taken back to critical care unit. She had some mild oozing from her left groin but did not require transfusion or further surgical procedures. By hospital day 3, procedure day 2 from the second procedure, she was able to ambulate, tolerate a regular diet and have her pain controlled reasonably well  with oral medications. At this point, she was deemed stable for discharge. She will be discharged home accompanied by her family. Her diet is regular. Her activities is as tolerated.   HOME MEDICATIONS:  Include: 1.  Amitriptyline 50 mg daily.  2.  Allopurinol 100 mg daily.  3.  Lyrica 300 mg 2 times a day.  4.  Onglyza 2.5 mg daily.  5.  Duloxetine 60 mg daily.  6.  Oxycodone 5 mg as needed twice a day.  7.  Edarbyclor 40-12.5 daily.  8.  Vitamin B, C and folic acid complex.  9.  Crestor 20 mg daily.  10.  Calcium tablet daily.  11.  Xarelto 15 mg b.i.d.  12.  Percocet as needed for pain.    Her return appointment will be in our office in 2 weeks  and she will call or contact our office with problems in the interim.     ____________________________ Annice NeedyJason S. Dew, MD jsd:dp D: 06/09/2013 16:46:32 ET T: 06/10/2013 07:44:22 ET JOB#: [id number]  cc: Annice NeedyJason S. Dew, MD, <Dictator> Annice NeedyJASON S DEW MD ELECTRONICALLY SIGNED 06/25/2013 16:08

## 2015-02-12 NOTE — Op Note (Signed)
PATIENT NAME:  [name] [name], [name] [name] MR#:  [id number] DATE OF BIRTH:  1956/10/20  DATE OF PROCEDURE:  08/05/2013  PREOPERATIVE DIAGNOSIS: Abscess right below-knee amputation site.   POSTOPERATIVE DIAGNOSIS: Abscess right below-knee amputation site.   PROCEDURES PERFORMED: 1. Excisional debridement of right below-knee amputation stump.  2. Placement of VAC dressing.   SURGEON: Renford DillsGregory G. Schnier M.D.   ANESTHESIA: General by LMA.   FLUIDS: Per anesthesia record.   ESTIMATED BLOOD LOSS: Minimal.   SPECIMEN: Debrided tissue not sent to pathology.   INDICATIONS: 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 Postoperatively, her course has been complicated by abscess formation at the level of the stump. Approximately one week ago she underwent initial opening and debridement. She is now being returned to the OR for further evaluation. Risks and benefits were reviewed. The patient has agreed to proceed.   DESCRIPTION OF PROCEDURE: The patient is taken to the operating room and placed in the supine position. After adequate general anesthesia is induced, the VAC dressing that is existing is removed and the stump is prepped and draped in sterile fashion. Appropriate timeout is called.   Evaluation of the stump demonstrates there is further necrotic tissue and this is sharply debrided using a curved Mayo scissors and a 15 blade scalpel. Necrotic muscle, fat and tendon is debrided. A curette is then used to clean up some of the soft tissues as well. The wound is inspected. It is irrigated with a liter of saline and a VAC dressing is replaced. The patient tolerated the procedure well and there were no immediate complications. It is difficult to assess the size of the wound as the entire stump is open. It measures approximately 8 x 6 cm and is 3 to 4 cm in depth. There is tendon, bone and deep soft tissues exposed.     ____________________________ Renford DillsGregory G. Schnier, MD ggs:sg D: 08/06/2013 10:40:11 ET T: 08/06/2013 11:03:42 ET JOB#: [id number]  cc: Renford DillsGregory G. Schnier, MD, <Dictator> Renford DillsGREGORY G SCHNIER MD ELECTRONICALLY SIGNED 08/29/2013 8:14

## 2015-02-12 NOTE — Op Note (Signed)
PATIENT NAME:  Shary DecampORAIN, Lilo MR#:  [id number] DATE OF BIRTH:  1956/01/02  DATE OF PROCEDURE:  06/19/2013  PREOPERATIVE DIAGNOSES: 1.  Ischemia, right leg.  2.  Complication by vascular device with thrombosis of femoral below-knee popliteal bypass.  3.  Atherosclerotic occlusive disease bilateral lower extremities with rest pain.   POSTOPERATIVE DIAGNOSES: 1.  Ischemia, right leg.  2.  Complication by vascular device with thrombosis of femoral below-knee popliteal bypass.  3.  Atherosclerotic occlusive disease bilateral lower extremities with rest pain.   PROCEDURES PERFORMED: 1.  Right lower extremity distal runoff, third order catheter placement.  2.  Initiation of thrombolysis.   SURGEON: Levora DredgeGregory Winnell Bento, M.D.   SEDATION: Versed 5 mg plus fentanyl 150 mcg administered IV. Continuous ECG, pulse oximetry and cardiopulmonary monitoring is performed throughout the entire procedure by the interventional radiology nurse. Total sedation time was 1 hour.   ACCESS: 6-French sheath, left common femoral artery.   FLUOROSCOPY TIME: 4.6 minutes.   CONTRAST USED: 18 mL.   INDICATIONS: [name] is a 59 year old woman who presented to the Emergency Room with increasing pain in her lower extremity. She recently had thrombolysis of her fem-pop bypass and was discharged on Coumadin. Apparently she was not taking that as her INR was 1.1 on the day of admission. Therefore, she will be reinitiated on thrombolysis for limb salvage. Risks and benefits were reviewed, and the patient has agreed to proceed.   DESCRIPTION OF PROCEDURE: The patient is taken to special procedures and placed in the supine position. After adequate sedation is achieved, the left groin is prepped and draped in a sterile fashion. Ultrasound is placed in a sterile sleeve. Ultrasound is utilized secondary to lack of appropriate landmarks and to avoid vascular injury. Under direct visualization, the common femoral artery is identified.  The femoral bifurcation is identified and then ultrasound is then scanned more proximally visualizing the common femoral artery. The common femoral artery is echolucent and pulsatile indicating patency. Image is recorded for the permanent record. Under direct visualization, a micropuncture needle is used to access the anterior wall of the common femoral artery, microwire followed by microsheath, J-wire followed by a 5-French sheath and 5-French pigtail catheter. The pigtail catheter is positioned in the mid infrarenal aorta and hand injection of contrast is used to create and abdominal aortogram. A stiff angled Glidewire and the pigtail catheter are then used to cross the aortic bifurcation. The catheter is advanced down to the distal external and then RAO projection of the groin is obtained. Cul-de-sac is noted at the beginning of the graft, and there is confirmation that the graft is thrombosed. The patient is given 4000 of heparin, and the wire catheter is negotiated into the graft itself. A 6-French Ansell sheath is then advanced in exchange for the 5-French sheath and positioned with its tip in the mid common femoral. Pigtail catheter is exchanged for a straight 120 slip catheter, and the catheter wire combination is negotiated down through the graft into the tibioperoneal trunk where hand injection demonstrates single vessel runoff with poor flows distally at the level of the ankle. It would appear most likely that the patient has extremely disadvantaged outflow and this is what is the cause of the thrombosis. Approximately 1 month ago no significant stenosis was noted with the graft. Having confirmed intraluminal placement distally, a wire is reintroduced and an infusion catheter with a 50 cm infusion length is then advanced over the wire. Wire is exchanged for the occlusion wire, and  the catheter is flushed with heparin. TPA will be initiated once the pharmacy has prepared it, and she will be run at 2 mg an  hour for several hours and then the rate will be decreased.   INTERPRETATION: Aortoiliac system is widely patent. Common femoral and profunda femoris on the right is widely patent. Graft and native superficial femoral artery are both occluded. Distally there is single-vessel runoff, however, at the level of the ankle there is significant disease resulting in very poor outflow. Thrombosis of the graft is confirmed, as noted above. TPA is initiated.  ____________________________ Renford Dills, MD ggs:sb D: 06/24/2013 08:45:13 ET T: 06/24/2013 09:16:21 ET JOB#: [id number]  cc: Renford Dills, MD, <Dictator> Renford Dills MD ELECTRONICALLY SIGNED 07/04/2013 9:11

## 2015-02-12 NOTE — Discharge Summary (Signed)
PATIENT NAME:  [name] [name], Letia MR#:  [id number] DATE OF BIRTH:  05/26/1956  DATE OF ADMISSION:  04/05/2013 DATE OF DISCHARGE:  04/08/2013  PRIMARY CARE PHYSICIAN: Yves DillNeelam Khan, MD  ADMITTING PHYSICIAN: Ramonita LabAruna Gouru, MD   DISCHARGE DIAGNOSES: 1.  Near syncope.  2.  Acute renal failure.  3.  Hypernatremia.  5.  Demand ischemia.   PROCEDURES: CT scan of the abdomen and pelvis, 2-D echocardiogram.   CONSULTATIONS: Cardiology, Dr. Adrian BlackwaterShaukat Khan.   DISCHARGE MEDICATIONS: Edarbi 40 mg daily. The patient is to resume home medications. Please refer to discharge medical reconciliation for list.   DISCONTINUED MEDICATIONS: Edarbyclor.   HOSPITAL COURSE: She was admitted through the Emergency Room after experiencing a near syncopal event at home. She was also found to be quite anemic with electrolyte abnormalities, specifically hypokalemia and evidence of decrease in renal function. Please refer to history and physical for full details. The patient's Hemoccult was negative. She also had CT of the abdomen and pelvis which was negative for intraperitoneal bleed. Her INR was therapeutic when she came in. The patient's course was also complicated by elevated troponin for which she was evaluated by Cardiology who made a diagnosis of demand ischemia and recommended an outpatient stress test given her history of coronary artery disease. The patient had a history of chronic kidney disease with acute deterioration. She received fluids, initially normal saline, however, the patient became hypernatremic. Due to that, this was switched to hypotonic fluids and corrected her free water deficit with normalization of her sodium and improvement in her creatinine to her baseline. The patient remained asymptomatic during her entire stay. She did not complain of any chest pain, shortness of breath, and did not experience any syncopal events in house. The patient is discharged to home in satisfactory condition.  DISCHARGE  INSTRUCTIONS:  1.  Diet: Low sodium, low fat.  2.  Activity: As tolerated.  3.  Followup:  Follow up with Dr. Yves DillNeelam Khan in 1 to 2 weeks and with Dr. Adrian BlackwaterShaukat Khan in 1 week for outpatient stress test.   DISCHARGE PROCESS TIME SPENT: 35 minutes.   ____________________________ Silas FloodSheikh A. Ellsworth Lennoxejan-Sie, MD sat:cb D: 04/20/2013 11:04:48 ET T: 04/20/2013 17:57:05 ET JOB#: [id number]  cc: Marland McalpineSheikh A. Ellsworth Lennoxejan-Sie, MD, <Dictator> Charlesetta GaribaldiSHEIKH A TEJAN-SIE MD ELECTRONICALLY SIGNED 04/21/2013 13:52

## 2015-02-12 NOTE — Op Note (Signed)
PATIENT NAME:  [name] [name], [name] [name] MR#:  [id number] DATE OF BIRTH:  1956/03/19  DATE OF PROCEDURE:  07/30/2013  PREOPERATIVE DIAGNOSIS: Abscess right below knee amputation stump.   POSTOPERATIVE DIAGNOSIS: Abscess right below knee amputation stump.  PROCEDURE PERFORMED: Excisional debridement of right below-knee amputation stump.   SURGEON: Levora DredgeGregory Schnier, M.D.   ANESTHESIA: General by LMA.   FLUIDS: Per anesthesia record.   ESTIMATED BLOOD LOSS: Minimal.   SPECIMEN: Cultures aerobic and anaerobic from the wound to microbiology segment of the gastroc muscle to pathology.   INDICATIONS: [name] is a 59 year old woman, who presented to the ER one Saturday and [facility] where she underwent a below-knee amputation during that hospitalization. She presented back to this hospital several days ago with necrotic changes and copious drainage from her stump. Risks and benefits for excisional debridement were reviewed. All questions answered. The patient has agreed to proceed. She has been informed this may result in revision to an above knee amputation.   DESCRIPTION OF PROCEDURE: The patient is taken to the operating room and placed in the supine position. After adequate general anesthesia is induced and appropriate invasive monitors are placed, her right leg is prepped and draped in a sterile fashion. Appropriate timeout is called.   Several staples are removed and then a large necrotic area on the lateral aspect extending above and below the suture line is ellipsed out with a 15 blade scalpel. This is primarily skin and soft tissues. Subsequently, the front portion of the skin bridge is also ellipsed out essentially opening the posterior flap. Using curved Mayos, the gastroc muscle and deep tissues including the neurovascular bundle are debrided. The entire anterior compartment is debrided up to the metaphysis of the fibula essentially debriding the anterior compartment  muscles to their origin. Both 15 blade scalpel as well as curved Mayos are used. Tissue includes tendon, necrotic muscle and neurovascular tissues as well. The wound is then irrigated and hemostasis obtained with Bovie cautery and a VAC dressing applied. The patient tolerated the procedure well and there were no immediate complications.    ____________________________ Renford DillsGregory G. Schnier, MD ggs:aw D: 07/30/2013 08:57:15 ET T: 07/30/2013 09:09:52 ET JOB#: [id number]  cc: Renford DillsGregory G. Schnier, MD, <Dictator> Renford DillsGREGORY G SCHNIER MD ELECTRONICALLY SIGNED 08/05/2013 17:56

## 2015-02-12 NOTE — Op Note (Signed)
PATIENT NAME:  [name] [name], [name] [name] MR#:  [id number] DATE OF BIRTH:  06/18/1956  DATE OF PROCEDURE:  06/20/2013  PREOPERATIVE DIAGNOSES:  1.  Ischemic right lower extremity with occluded femoral-popliteal bypass.  2.  Diabetes.  3.  Hypertension.   POSTOPERATIVE DIAGNOSES:  1.  Ischemic right lower extremity with occluded femoral-popliteal bypass.  2.  Diabetes.  3.  Hypertension.   PROCEDURE:  1.  Right lower extremity angiogram through existing catheter.  2.  Infusion for thrombolysis, right lower extremity.  3.  Mechanical rheolytic thrombectomy to right femoral popliteal bypass graft, popliteal artery and posterior tibial arteries.  4.  Percutaneous transluminal angioplasty of proximal bypass graft anastomosis with 7 mm diameter angioplasty balloon.  5.  Percutaneous transluminal angioplasty of distal bypass anastomosis with a 5 mm diameter angioplasty balloon.  6.  Percutaneous transluminal angioplasty of entire right posterior tibial artery with 3 mm diameter angioplasty balloon.  7.  StarClose closure device, left femoral artery.   SURGEON: Annice NeedyJason S Susana Duell, M.D.   ANESTHESIA: Local with moderate conscious sedation.   ESTIMATED BLOOD LOSS: Approximately 25 mL.   INDICATION FOR PROCEDURE: A [age] year old [place] female with extensive peripheral vascular disease. She was here with a recurrent occlusion of the right femoral popliteal bypass graft. W are attempting to salvage this for limb salvage, although it was discussed with the patient this was unlikely at the onset of our admission. She desired an attempt at limb salvage, and we are on our third angiogram in the last 36 hours to try to reopen her graft.   DESCRIPTION OF PROCEDURE: The patient is brought to the vascular and interventional radiology suite. Groins were shaved and prepped and a sterile surgical field was created. The existing lysis catheter had TPA infusing. This was rewired and removed and imaging was performed. This  showed thrombosis of the bypass graft. There has previously been a stenosis at the proximal portion of the bypass graft. This area was ballooned with a 7 mm diameter angioplasty balloon proximally. Mechanical rheolytic thrombectomy was performed throughout the entirety of the bypass graft, the popliteal artery and the posterior tibial artery. Following this, there was residual thrombus at the distal bypass anastomosis and into her tibial vessels with poor runoff. The AngioJet catheter was used to perform mechanical rheolytic thrombectomy to the distal bypass grafts and the tibials once more. The posterior tibial artery had thrombus throughout much of its course and it was treated with a 3 mm diameter angioplasty balloon after mechanical thrombectomy. The distal bypass anastomosis was treated with a 5 mm diameter angioplasty balloon. At the conclusion of this, the bypass graft was opened but the runoff did appear poor distally. Her hope was that there was some spasm from the wire and recent angioplasty on the posterior tibial artery, which was the best runoff distally. The anterior tibial artery provided some runoff as well. At this point, we had done all we could do to try to salvage her graft and if it fails he will require an amputation in the future. The sheath was removed. StarClose closure device was deployed in the usual fashion with excellent hemostatic result. The patient tolerated the procedure well and was taken to the recovery room in stable condition.  ____________________________ Annice NeedyJason S. Jery Hollern, MD jsd:aw D: 06/20/2013 11:07:26 ET T: 06/20/2013 11:41:55 ET JOB#: [id number]  cc: Annice NeedyJason S. Edmund Rick, MD, <Dictator> Annice NeedyJASON S Blade Scheff MD ELECTRONICALLY SIGNED 06/25/2013 16:16

## 2015-02-12 NOTE — Op Note (Signed)
PATIENT NAME:  [name] [name], [name] [name] MR#:  [id number] DATE OF BIRTH:  [birth year]  DATE OF PROCEDURE:  12/25/2012  PREOPERATIVE DIAGNOSES:   1.  Atherosclerotic occlusive disease, bilateral lower extremities with rest pain of the right lower extremity.  2.  Complication of vascular device with occlusion of right superficial femoral artery stent.  3.  Tobacco abuse.  4.  Diabetes.  5.  Hypertension.   POSTOPERATIVE DIAGNOSES:   1.  Atherosclerotic occlusive disease, bilateral lower extremities with rest pain of the right lower extremity.  2.  Complication of vascular device with occlusion of right superficial femoral artery stent.  3.  Tobacco abuse.  4.  Diabetes.  5.  Hypertension.   PROCEDURES PERFORMED:   1.  Abdominal aortogram.  2.  Right lower extremity distal runoff, third order catheter placement.   SURGEON:  Renford DillsGregory G. Schnier, MD  SEDATION:  Precedex drip.   ACCESS:  A 5 French sheath left common femoral artery.   FLUOROSCOPY TIME:  2.2 minutes.   CONTRAST USED:  Isovue 58 mL.   INDICATIONS:  The patient is a 59 year old woman who presented to the Emergency Room with increasing pain in her left lower extremity and was found to have occlusion of her previously placed stents. She has undergone multiple interventions on the right leg, the most recent of which was in January and therefore, I do not feel that re-intervention is optimal therapy. She is undergoing angiography to see if she is a candidate for surgical bypass. Risks and benefits were reviewed. All questions answered. The patient agrees to proceed.   DESCRIPTION OF PROCEDURE:  The patient is taken to special procedures and placed in the supine position after Precedex drip has been initiated. Left groin is anesthetized with lidocaine and ultrasound is placed in a sterile sleeve. Common femoral artery is identified. It is pulsatile and echolucent indicating patency. Image is recorded for the permanent record. Then 1% lidocaine  is infiltrated and access is obtained with a micropuncture needle, microwire followed by MicroSheath, J-wire followed by a 5 French sheath and a 5 French pigtail catheter. The pigtail catheter is positioned at the level of T12. AP projection of the aorta is obtained. Pigtail catheter is repositioned and LAO projection of the pelvis is obtained. A stiff angled Glidewire and pigtail catheter are then negotiated into the profunda femoris and distal runoff is obtained. After review of the images, the catheter is removed over a wire. Sheath is imaged in an LAO projection and a Mynx device is deployed without difficulty. There are no immediate complications.   INTERPRETATION:  The aorta, common iliac and external iliac arteries are widely patent bilaterally. The right common femoral and profunda femoris is widely patent. The previously noted stents are thrombosed. There is a small cul-de-sac proximally at the origin of the SFA. There is reconstitution of the at knee popliteal, below-knee popliteal and posterior tibial as the single vessel runoff are widely patent all the way down to the foot. The posterior tibial does appear to be a good tibial vessel and the distal popliteal does appear to be a very reasonable target for bypass.   SUMMARY:  Occlusion of the superficial femoral artery on the right in the zone of the previously placed stents. The distal popliteal artery is patent and there is preservation of the single vessel posterior tibial runoff which does appear to be adequate target for bypass.    ____________________________ Renford DillsGregory G. Schnier, MD ggs:si D: 12/25/2012 19:47:00 ET T: 12/25/2012 21:42:57 ET  JOB#: [id number]  cc: Renford Dills, MD, <Dictator> Dwayne D. Juliann Pares, MD Margaretann Loveless, MD  Renford Dills MD ELECTRONICALLY SIGNED 01/21/2013 17:20

## 2015-02-12 NOTE — Consult Note (Signed)
Brief Consult Note: Diagnosis: mildly elevated troponin, due to demand iscaemia.   Patient was seen by consultant.   Consult note dictated.   Recommend further assessment or treatment.   Comments: Advise may be transfusing another PRBC, and then f/u in office  for lexiscan myoview as has probably CAD with h/o PVD.  Electronic Signatures: Radene KneeKhan, Leomia Blake Ali (MD)  (Signed 15-Jun-14 10:11)  Authored: Brief Consult Note   Last Updated: 15-Jun-14 10:11 by Radene KneeKhan, Erasmo Vertz Ali (MD)

## 2015-02-12 NOTE — Consult Note (Signed)
PATIENT NAME:  [name] [name], Analaya MR#:  [id number] DATE OF BIRTH:  [birth year]  DATE OF CONSULTATION:  12/25/2012   REFERRING PHYSICIAN: Renford DillsGregory G. Schnier, MD  CONSULTING PHYSICIAN:  Starleen Armsawood S. Elgergawy, MD  REASON FOR MEDICAL CONSULT: Medical management.   HISTORY OF PRESENT ILLNESS: This is a 59 year old female with significant past medical history of peripheral vascular disease, status post multiple interventions in the right lower extremity, with recent thrombolysis in the right lower extremity, hypertension, diabetes. The patient presented yesterday to ED with complaints of right lower extremity pain and cold foot. The patient had decreased pulses and was admitted under vascular surgery service for right lower extremity ischemia. The patient was started on IV heparin drip, and medical consult was called for medical management. The patient is being planned with angiogram with possible intervention today. The patient afebrile. Denies any fever, any cough, any productive sputum, any chills.   PAST MEDICAL HISTORY:  1. Diabetes mellitus.  2. Hypertension.  3. Peripheral vascular disease.  4. 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  5. Stent placement in the right lower limb artery due to blockage and decreased circulation 6 months ago, status post thrombolysis in January of this year.   ALLERGIES: No known drug allergies.   SOCIAL HISTORY: The patient is current smoker. Denies any illegal drug use. Drinks beer occasionally.   FAMILY HISTORY: Significant for diabetes mellitus in the family.   HOME MEDICATIONS:  1. Acetaminophen/hydrocodone 325/5 mg 2 to 3 times a day as needed.  2. Percocet 1 tablet 2 times a day.  3. Aspirin 81 mg oral daily.  4. Lyrica 300 mg oral 2 times a day.  5. Amitriptyline 50 mg at bedtime.  6. Cymbalta 60 mg oral daily.  7. Metformin 500 mg oral daily.  8. Onglyza 5 mg oral 2 tablets daily.  9. Allopurinol 100 mg oral daily.  10. Crestor 20 mg oral daily.   11. Edarbyclor 40/25 mg oral daily.  12. Plavix 75 mg oral daily.  13. Calcium 600 with vitamin D 600 international units 2 times daily.   REVIEW OF SYSTEMS:  CONSTITUTIONAL: The patient denies any fever or chills, fatigue, weakness. Reported decreased ambulation due to the right lower extremity pain.  EYES: Denies blurry vision, double vision, inflammation or tearing in the right eye.  ENT: Denies tinnitus, ear pain, hearing loss.  RESPIRATORY: Denies cough, wheezing, hemoptysis or dyspnea.  CARDIOVASCULAR: Denies any chest pain, orthopnea, edema, arrhythmia.  GASTROINTESTINAL: Denies nausea, vomiting, diarrhea, abdominal pain, coffee-ground emesis,   GENITOURINARY: Denies dysuria, hematuria, renal colic.  SKIN: Denies skin rash, acne or lesions.  MUSCULOSKELETAL: Denies any joint swelling. Has history of gout, complains of significant pain in the right lower extremity.  NEUROLOGICAL: Denies any CVA, TIA, dysarthria, focal deficits or seizures.  PSYCHIATRIC: Denies anxiety, insomnia or depression.   PHYSICAL EXAMINATION:  VITAL SIGNS: Temperature 97.8, pulse 65, respiratory rate 18, blood pressure 132/79,   saturating 97% on room air.  GENERAL: Well-nourished female who looks comfortable, in no apparent distress.  HEENT: Head atraumatic, normocephalic. Pupils equal and reactive to light. Pink conjunctivae. Anicteric sclerae. Moist oral mucosa.  NECK: Supple. No thyromegaly. No JVD.  CHEST: Good air entry bilaterally. No wheezing, rales, rhonchi.  CARDIOVASCULAR: S1, S2 heard. No rubs, murmur or gallops.  ABDOMEN: Soft, nontender, nondistended. Bowel sounds present.  EXTREMITIES: No edema, no clubbing. Has right lower extremity decreased pulses, nonpalpable, as well decreased pulses in the left lower extremity, increased capillary refill time, and  the right lower extremity appears to be colder than the left lower extremity.  NEUROLOGICAL: Cranial nerves grossly intact. Motor 5 out of 5.   PSYCHIATRIC: Appropriate affect. Awake, alert x3. Intact judgment and insight.  SKIN: Normal skin turgor. No rash.   PERTINENT LABORATORIES: Glucose 64, BUN 19, creatinine 1.32, sodium 143, potassium 3.3, chloride 111, CO2 29, anion gap 3, AST 50, ALT 47, alkaline phosphatase 63. White blood cell 8.1, hemoglobin 13.8, hematocrit 43.3, platelets 365.   ASSESSMENT AND PLAN:  1. Peripheral vascular disease with acute right lower extremity ischemia. The patient had multiple interventions in the past of her right lower extremity, with recent thrombolysis of previous stent. This is being managed by the vascular surgery service. The patient currently is on heparin drip. To resume aspirin and Plavix after her angiogram is done. To be continued on her statin. The patient was counseled at length to quit smoking.  2. Hypokalemia. Will replace. Will monitor closely as the patient is on both ACE and ARB inhibitors.  3. Diabetes mellitus. Will hold oral agents, mainly her metformin, especially as she is getting angiogram today. Will continue her on insulin sliding scale.  4. Hypertension, acceptable. Continue with home medications.  5. Chronic kidney disease, creatinine appears to be at baseline. Will monitor closely. Will continue with IV fluid especially as she is getting angiogram today.  6. Tobacco abuse. The patient was counseled at length. At this point, she still refuses nicotine patch.  7. Deep vein thrombosis prophylaxis. The patient is on heparin drip.   CODE STATUS: Full code.   TOTAL TIME SPENT ON MEDICAL CONSULT: 45 minutes.    ____________________________ Starleen Arms, MD dse:OSi D: 12/25/2012 08:39:56 ET T: 12/25/2012 08:55:31 ET JOB#: [id number]  cc: Starleen Arms, MD, <Dictator> DAWOOD Teena Irani MD ELECTRONICALLY SIGNED 01/02/2013 2:50

## 2015-02-12 NOTE — Op Note (Signed)
PATIENT NAME:  [name] [name], Kalei MR#:  [id number] DATE OF BIRTH:  October 12, 1956  DATE OF PROCEDURE:  09/15/2013  PREOPERATIVE DIAGNOSES:  1. 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  2. Peripheral arterial disease, status post multiple previous revascularizations.  3. Tobacco dependence.  4. Diabetes.  5. Hypertension.   POSTOPERATIVE DIAGNOSES:  1. 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  2. Peripheral arterial disease, status post multiple previous revascularizations.  3. Tobacco dependence.  4. Diabetes.  5. Hypertension.   PROCEDURE: Right above-knee amputation.   SURGEON: Annice NeedyJason S. Velvia Mehrer, M.D.   ANESTHESIA: General.   ESTIMATED BLOOD LOSS: 200 mL.   INDICATION FOR PROCEDURE: A 59 year old [place] female well known to our service for her peripheral artery disease. She is brought in due to a nonhealing right below-knee amputation. Her wound has opened widely, and there is a large amount of tibia exposed. At this point, there is not enough tissue to cover the tibia and maintain this as a functional below the knee amputation stump, and we are converting her to an above-knee amputation. Risks and benefits were discussed, and informed consent was obtained.    DESCRIPTION OF PROCEDURE: The patient is brought to the operative suite, and after an adequate level of general anesthesia obtained, the right lower extremity is sterilely prepped and draped and a sterile surgical field was created. A fishmouth incision was created in the lower thigh, and I dissected out the femur. A periosteal elevator was used to get the femur dissected out well proximal to the incision, and the oscillating saw was used to transect the femur. The skin incision was completed, and the posterior flap was then completed with the amputation knife. The nerve and all bleeding vessels were then controlled with hemostats and 2-0 silk ties or 2-0  silk suture ligatures. The wound was then irrigated and closed with 2 figure-of-eight 0 Vicryl sutures to close the space over the femur. The superficial fascia was closed with a series of figure-of-eight 0 Vicryl sutures. Subcutaneous tissue was closed with a 2-0 Vicryl. Skin was coapted with staples. A large sterile dressing was then placed. The patient tolerated the procedure well and was taken to the recovery room in stable condition.   ____________________________ Annice NeedyJason S. Abas Leicht, MD jsd:gb D: 09/15/2013 16:32:00 ET T: 09/16/2013 01:18:15 ET JOB#: [id number]  cc: Annice NeedyJason S. Lenord Fralix, MD, <Dictator> Annice NeedyJASON S Bobette Leyh MD ELECTRONICALLY SIGNED 09/29/2013 9:22

## 2015-02-12 NOTE — Op Note (Signed)
PATIENT NAME:  [name] [name], Jaide MR#:  [id number] DATE OF BIRTH:  1955/11/15  DATE OF PROCEDURE:  05/27/2013  PREOPERATIVE DIAGNOSIS:  1.  Atherosclerotic occlusive disease, bilateral lower extremities, with rest pain of the right lower extremity.  2.  Thrombosis of right femoral to below-knee popliteal bypass graft.   PROCEDURE PERFORMED:  1.  Abdominal aortogram.  2.  Right lower extremity distal runoff, third order catheter placement.  3.  Initiation of TPA thrombolysis therapy.   SURGEON: Renford DillsGregory G Ronnett Pullin, M.D.   SEDATION: Versed 3 mg, fentanyl 150 mcg administered IV. Continuous ECG, pulse oximetry and cardiopulmonary monitoring is performed throughout the entire procedure by the interventional radiology nurse. Total sedation time was 40 minutes.   ACCESS: 6-French sheath, left common femoral artery.   FLUOROSCOPY TIME: 4.2 minutes.   CONTRAST USED: Isovue 25 mL.   INDICATIONS: [name] is a 59 year old woman, who presented to the ER earlier this morning with increasing pain in her right lower extremity. Physical examination as well as noninvasive studies demonstrated occlusion of her right femoral to popliteal bypass graft. She is therefore undergoing angiography with the hope for intervention and initiation of lytic therapy for limb salvage. The risks and benefits were reviewed. All questions answered. The patient agrees to proceed.   DESCRIPTION OF PROCEDURE: The patient is taken to special procedures and placed in the supine position. After adequate sedation is achieved, both groins are prepped and draped in sterile fashion. Lidocaine 1% is infiltrated in the soft tissues in the left groin and ultrasound is placed in a sterile sleeve. Ultrasound is utilized secondary to lack of appropriate landmarks and to avoid vascular injury. Under direct ultrasound visualization, the common femoral artery is identified. It is echolucent and pulsatile indicating patency. Image is recorded for the  permanent record. A micropuncture needle is then used to access the common femoral artery, microwire followed micro sheath, J-wire followed by a 5-French sheath and 5-French pigtail catheter. The pigtail catheter is positioned in the infrarenal abdominal aorta and hand injection of contrast is used to demonstrate the distal aorta. Pigtail catheter is then repositioned to above the bifurcation and a LAO projection is obtained. A stiff angled Glidewire and pigtail catheter are then used to cross the aortic bifurcation. The pigtail is advanced down to the distal external iliac on the right. RAO projection of the groin is obtained. A small cul-de-sac is noted where the graft originates. The graft itself is occluded as is the SFA. Profunda femoris is patent.   With the wire advanced into the profunda femoris. the 5-French sheath is exchanged for a 6-French Ansell sheath and the Arn Medalnsell is positioned with its tip in the proximal common femoral. Using the angled Glidewire and a KMP catheter, the wire catheter is negotiated into the graft. The catheter and wire combination is then advanced down to the distal anastomosis and once past the distal anastomosis,  the wire is removed. Blood is returned from the catheter and hand injection of contrast demonstrates patency of the posterior tibial artery.   The Glidewire is then reintroduced. The Kumpe catheter is removed and a TPA infusion catheter is advanced over the wire. The catheter infusion length catheter is 50 cm. The catheter is positioned with the tip just past the distal anastomosis, which crosses the bypass graft in its entirety.   The catheter is then secured with the sheath to the skin of the left thigh. Heparin bolus of 3000 is given, and a TPA infusion of 1 mg  per hour for the next 4 hours will be initiated once pharmacy sends the drip. She will then be lysed overnight at 0.5 mg/h.   INTERPRETATION: Aorta, iliac system on the right is widely patent. Profunda  femoris and common femoral are patent. The fem-pop graft is occluded. The tibial vessel runoff remains patent. The patient will be lysed overnight as described above.   ____________________________ Renford Dills, MD ggs:aw D: 05/27/2013 18:19:34 ET T: 05/28/2013 07:30:25 ET JOB#: [id number]  cc: Renford Dills, MD, <Dictator> Renford Dills MD ELECTRONICALLY SIGNED 06/03/2013 8:50

## 2015-02-12 NOTE — Op Note (Signed)
PATIENT NAME:  [name] [name], [name] [name] MR#:  [id number] DATE OF BIRTH:  Mar 24, 1956  DATE OF PROCEDURE:  05/28/2013  PREOPERATIVE DIAGNOSES:  1.  Acute right lower extremity ischemia with rest pain, with occluded femoral-popliteal bypass.  2.  Status post placement of thrombolysis catheter.  3.  Hypertension.  4.  Diabetes.  POSTOPERATIVE DIAGNOSES: 1.  Acute right lower extremity ischemia with rest pain, with occluded femoral-popliteal bypass.  2.  Status post placement of thrombolysis catheter.  3.  Hypertension.  4.  Diabetes.  PROCEDURE: 1.  Angiogram through existing catheter, right lower extremity.  2.  Percutaneous transluminal angioplasty of proximal right femoral popliteal bypass graft anastomosis with 6 mm diameter angioplasty balloon.  3.  StarClose closure device, left femoral artery.   SURGEON: Annice NeedyJason S Dew, M.D.   ANESTHESIA: Local with moderate conscious sedation.   ESTIMATED BLOOD LOSS: 25 mL.   INDICATION FOR PROCEDURE: This is a 59 year old African American female with occluded fem-pop bypass, who was brought down for lytic catheter placement yesterday. She has run lysis overnight. She is brought back for a second look angiography.   DESCRIPTION OF PROCEDURE: The patient is brought to the vascular interventional radiology suite. The existing catheter and groin was sterilely prepped and draped and a sterile surgical field was created. The lysis catheter was removed. Angiogram was performed, which showed resolution of the thrombosis from the femoral-popliteal bypass graft. There was a 70% to 75% stenosis from intimal hyperplasia at the proximal bypass anastomosis and the remainder of the graft and the distal anastomosis appeared patent. This lesion was treated with a 6 mm diameter high-pressure angioplasty balloon. A tight waste taken, which resolved with angioplasty. Following angioplasty, there was less than 20% residual stenosis. At this point, I elected to terminate the  procedure. The sheath was pulled back to the ipsilateral external iliac artery and oblique arteriogram was performed. StarClose closure device was deployed in the usual fashion with excellent hemostatic result. The patient tolerated the procedure well and was taken to the recovery room in stable condition.    ____________________________ Annice NeedyJason S. Dew, MD jsd:aw D: 05/29/2013 10:48:00 ET T: 05/29/2013 11:15:48 ET JOB#: [id number]  cc: Annice NeedyJason S. Dew, MD, <Dictator> Annice NeedyJASON S DEW MD ELECTRONICALLY SIGNED 06/09/2013 16:07

## 2015-02-12 NOTE — Discharge Summary (Signed)
PATIENT NAME:  [name] [name], [name]:  [id number] DATE OF BIRTH:  [birth year]  DISCHARGE DIAGNOSES:  1.  Right below-knee amputation wound. 2.  Peripheral vascular disease, status post below-knee amputation, right side.   SECONDARY DIAGNOSES:  1.  Diabetes mellitus.  2.  Hypertension.  3.  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   ADMITTING PHYSICIAN: Shreyang H. Allena KatzPatel, MD.  DISCHARGE PHYSICIAN:  Silas FloodSheikh A. Tejan-Sie, MD.  PROCEDURE: Wound debridement on 07/30/2013 and 08/07/2013. Also had wound vacuum-assisted closure revision.   DISCHARGE MEDICATIONS:  1.  Oxycodone 15 mg p.r.n. q. 6.  2.  Amitriptyline 50 mg at bedtime.  3.  Allopurinol 100 mg daily.  4.  Lyrica 300 mg b.i.d. 5.  Onglyza 2.5 mg daily. 6.  Duloxetine 60 mg daily.  7.  Edarbyclor 40/12.5 one tablet daily q.a.m.  8.  Warfarin 2 mg (see med rec) 9.  Calcium/vitamin D supplements b.i.d.  10.  Pantoprazole 40 mg b.i.d.  11.  Docusate sodium 100 mg capsule daily. 12.  Ceftriaxone 1 g q. 24.  13.  Hydrochlorothiazide 25 mg daily.  14.  (see med rec)600 mg every 12 hours.  15.  Continue both antibiotics for an additional 10 days.  16.  Nicotine patch 14 mg q. 24.  17.  Polyethylene glycol 17 g daily p.r.n.   HOSPITAL COURSE: This lady was admitted through the Emergency Room after presenting with foul-smelling drainage from her right BKA stump site. [facility] Please refer to the history and physical for full details. The patient underwent MRI of the her leg, which did not reveal any bony involvement. She was seen by vascular surgery, Dr. Gilda CreaseSchnier and Dr. Wyn Quakerew, who performed 2 wound debridements, the second one being completely successful with clean granulating tissue subsequent to that. She had a wound VAC placement, which was also revised once during her  stay here. The patient'Lexie Koehl pain was controlled with oxycodone. Diabetes remained well controlled with sliding  scale insulin, basal insulin regimen. The patient'Lean Fayson other comorbidities remained stable and uncomplicated.   CONSULTATIONS:  Vascular surgery, Dr. Gilda CreaseSchnier and Dr. Wyn Quakerew.   DISCHARGE INSTRUCTIONS: DIET: Low sodium, low fat.  ACTIVITY: As tolerated.   FOLLOWUP: Dr. Yves DillNeelam Khan in 2 to 4 weeks after being discharged from skilled nursing facility.   DISCHARGE PROCESS TIME SPENT: 35 minutes.    ____________________________ Silas FloodSheikh A. Ellsworth Lennoxejan-Sie, MD sat:np D: 08/11/2013 13:56:19 ET T: 08/11/2013 15:53:16 ET JOB#: [id number]  cc: Marland McalpineSheikh A. Ellsworth Lennoxejan-Sie, MD, <Dictator>   Charlesetta GaribaldiSHEIKH A TEJAN-SIE MD ELECTRONICALLY SIGNED 08/27/2013 13:41

## 2015-02-12 NOTE — H&P (Signed)
Subjective/Chief Complaint right leg pain   History of Present Illness 60 yo woman well known to the service who presented to the ER with increased pain in the right leg.  She has a history of multiple interventions in the past in the right SFA.  The most recent of which was January 2014.  The onset of the pain is over the last few days.  No new trauma.  She is still smoking.    Work up in the ER suggests the right SFA stens are reoccluded.   Past History Arthritis:  HTN:  Diabetes Mellitus, Type II (NIDD):  Angioplasty: Right leg Lumbar Discectomy:  Splenectomy:   Past Med/Surgical Hx:  Angioplastic on  right leg:   Arthritis:   HTN:   Diabetes Mellitus, Type II (NIDD):   stent in right leg:   Angioplasty: Right leg  Lumbar Discectomy:   Splenectomy:   ALLERGIES:  No Known Allergies:   HOME MEDICATIONS: Medication Instructions Status  Edarbyclor 40 mg-25 mg oral tablet 1 tab(s) orally once a day Active  Crestor 20 mg oral tablet 1 tab(s) orally once a day (at bedtime) Active  Cymbalta 60 mg oral delayed release capsule 1 cap(s) orally once a day Active  acetaminophen-hydrocodone 325 mg-5 mg oral tablet 1 tab(s) orally 2-3 times a day Active  Onglyza 5 mg oral tablet 2 tab(s) orally once a day Active  Lyrica 300 mg oral capsule 1 cap(s) orally 2 times a day Active  acetaminophen-oxycodone 325 mg-7.5 mg oral tablet 1 tab(s) orally 2 times a day Active  metformin 500 mg oral tablet 1 tab(s) orally once a day Active  Aspirin Enteric Coated 81 mg oral delayed release tablet 1 tab(s) orally once a day Active  Calcium 600+D 600 mg-200 intl units oral tablet 1 tab(s) orally 2 times a day Active  amitriptyline 50 mg oral tablet 1 tab(s) orally once a day (at bedtime) Active  allopurinol 100 mg oral tablet 1 tab(s) orally once a day Active  clopidogrel 75 mg oral tablet 1 tab(s) orally once a day Active   Family and Social History:  Family History Non-Contributory   Social  History positive  tobacco, positive  tobacco (Current within 1 year), negative ETOH, negative Illicit drugs   Place of Living Home   Review of Systems:  Fever/Chills No   Cough No   Sputum No   Abdominal Pain No   Diarrhea No   Constipation No   Nausea/Vomiting No   SOB/DOE No   Chest Pain No   Telemetry Reviewed NSR   Dysuria No   Physical Exam:  GEN well developed, no acute distress   HEENT PERRL, hearing intact to voice, moist oral mucosa   NECK supple  trachea midline   RESP normal resp effort  no use of accessory muscles   CARD regular rate  No LE edema  no JVD   ABD denies tenderness  denies Flank Tenderness  soft  nondistended   EXTR negative cyanosis/clubbing, negative edema, vright leg warm to touch pulses nonpalpable   SKIN No rashes, No ulcers   NEURO cranial nerves intact, follows commands, motor/sensory function intact   PSYCH alert, A+O to time, place, person, poor insight   Lab Results: Hepatic:  04-Mar-14 18:52   Bilirubin, Total 0.3  Alkaline Phosphatase 63  SGPT (ALT) 47  SGOT (AST)  50  Total Protein, Serum 6.7  Albumin, Serum  3.2  Routine Chem:  04-Mar-14 18:52   Glucose, Serum  64  BUN  19  Creatinine (comp)  1.32  Sodium, Serum 143  Potassium, Serum  3.3  Chloride, Serum  111  CO2, Serum 29  Calcium (Total), Serum 8.8  Osmolality (calc) 285  eGFR (African American)  52  eGFR (Non-African American)  45 (eGFR values <35m/min/1.73 m2 may be an indication of chronic kidney disease (CKD). Calculated eGFR is useful in patients with stable renal function. The eGFR calculation will not be reliable in acutely ill patients when serum creatinine is changing rapidly. It is not useful in  patients on dialysis. The eGFR calculation may not be applicable to patients at the low and high extremes of body sizes, pregnant women, and vegetarians.)  Anion Gap  3  Routine Coag:  04-Mar-14 18:52   Activated PTT (APTT) 29.7 (A HCT  value >55% may artifactually increase the APTT. In one study, the increase was an average of 19%. Reference: "Effect on Routine and Special Coagulation Testing Values of Citrate Anticoagulant Adjustment in Patients with High HCT Values." American Journal of Clinical Pathology 2006;126:400-405.)  Prothrombin 13.0  INR 1.0 (INR reference interval applies to patients on anticoagulant therapy. A single INR therapeutic range for coumarins is not optimal for all indications; however, the suggested range for most indications is 2.0 - 3.0. Exceptions to the INR Reference Range may include: Prosthetic heart valves, acute myocardial infarction, prevention of myocardial infarction, and combinations of aspirin and anticoagulant. The need for a higher or lower target INR must be assessed individually. Reference: The Pharmacology and Management of the Vitamin K  antagonists: the seventh ACCP Conference on Antithrombotic and Thrombolytic Therapy. CSNKNL.9767Sept:126 (3suppl): [id number] A HCT value >55% may artifactually increase the PT.  In one study,  the increase was an average of 25%. Reference:  "Effect on Routine and Special Coagulation Testing Values of Citrate Anticoagulant Adjustment in Patients with High HCT Values." American Journal of Clinical Pathology 2006;126:400-405.)  Routine Hem:  04-Mar-14 18:52   WBC (CBC) 8.1  RBC (CBC) 4.62  Hemoglobin (CBC) 13.8  Hematocrit (CBC) 43.3  Platelet Count (CBC) 362 (Result(s) reported on 24 Dec 2012 at 07:32PM.)  MCV 94  MCH 29.8  MCHC  31.8  RDW 13.3   Radiology Results: [place]    04-Mar-14 21:18, [place] Flow Doppler Lower Extrem Right (Leg)  [place] Flow Doppler Lower Extrem Right (Leg)  REASON FOR EXAM:    pain in leg, poor circulation with hx SFA stent  COMMENTS:       PROCEDURE: [place] - [place]PLER LOW EXTR RIGHT  - Dec 24 2012  9:18PM     RESULT: Doppler interrogation of the deep venous system of the right leg   is performed from the  common femoral vein through the popliteal vein. The   deep venous structures show full compressibility with grayscale   compression imaging with normal color and spectral Doppler appearance   throughout the deep venous system investigated. The stentin the femoral   artery is partially visualized incidentally and shows no definite   evidence of flow in the right common femoral artery through the distal   femoral artery.    IMPRESSION:   1. No evidence of right lower extremity deep vein thrombosis.  2. Findings consistent with thrombus in the right femoral arterial stent.    Dictation Site: 6        Verified By: GSundra Aland M.D., MD  LabUnknown:  PACS Image    Assessment/Admission Diagnosis 1.  Acute  on chronic ischemia right leg            Heparin gtt            hydrate            angiogram on March 5th, if BK pop is still open and PT run off present then would op for bypass as the stents have failed three times most recently 2 months ago. 2. DM           medicine consult 3.  Tobacco abuse            encourage smoking cessation 4. Hypertension            continue home meds 5.  Depression             continue home meds 6.  hypercholesterolemia            continue statin   Plan level 3 admit   Electronic Signatures: Hortencia Pilar (MD)  (Signed 05-Mar-14 12:15)  Authored: CHIEF COMPLAINT and HISTORY, PAST MEDICAL/SURGIAL HISTORY, ALLERGIES, HOME MEDICATIONS, FAMILY AND SOCIAL HISTORY, REVIEW OF SYSTEMS, PHYSICAL EXAM, LABS, Radiology, ASSESSMENT AND PLAN   Last Updated: 05-Mar-14 12:15 by Hortencia Pilar (MD)

## 2015-02-12 NOTE — Op Note (Signed)
PATIENT NAME:  [name] [name], [name] [name] MR#:  [id number] DATE OF BIRTH:  [birth year]  DATE OF PROCEDURE:  09/24/2013  PREOPERATIVE DIAGNOSIS:  Sepsis.   POSTOPERATIVE DIAGNOSIS:  Sepsis.  OPERATION:  Central venous catheter insertion.   ANESTHESIA:  Local.   SURGEON:  Dr. Michela PitcherEly.   OPERATIVE PROCEDURE:  With the patient in the supine position, her neck was appropriately padded and positioned.  The right neck was previously interrogated with the ultrasound.  It did appear to be a compressible patent right internal jugular vein.  The area was prepped with ChloraPrep and draped with sterile towels.  1% Xylocaine was used for anesthesia.  The vein was cannulated on a single pass under ultrasound guidance and a wire passed into the great vessel system without difficulty.  The skin incision was enlarged after the needle was withdrawn and a vein dilator inserted over the wire.  The dilator was removed and a triple-lumen catheter inserted over the wire without difficulty.  The wire was removed.  The catheter was flushed and appropriately capped.  The device was sewed in place using 3-0 silk.  Sterile dressing was applied.  The patient was returned to the recovery room, having tolerated the procedure well.  Sponge, instrument and needle counts were correct x 2 in the Operating Room.    ____________________________ [name] Orealph L. Ely III, MD rle:ea D: 09/24/2013 02:26:32 ET T: 09/24/2013 03:23:33 ET JOB#: [id number]  cc: [name] Orealph L. Ely III, MD, <Dictator> [name] OreALPH L ELY MD ELECTRONICALLY SIGNED 09/24/2013 20:00

## 2015-02-12 NOTE — Op Note (Signed)
DATE OF BIRTH:  03-Mar-1956  DATE OF PROCEDURE:  12/27/2012  PREOPERATIVE DIAGNOSIS:  Critical ischemia of the right lower extremity.   POSTOPERATIVE DIAGNOSIS:  Critical ischemia of the right lower extremity.  PROCEDURE PERFORMED:  Right femoral to below-knee popliteal bypass grafting using 6 mm PTFE Propaten graft.   SURGEON:  Dr. Levora DredgeGregory Schnier  FIRST ASSISTANT:  Ms. Lamarr LulasChelsea Haney  ANESTHESIA:  General by endotracheal intubation.   FLUIDS:  Per anesthesia record.   ESTIMATED BLOOD LOSS: 100 mL.   SPECIMEN:  None.   INDICATIONS: The patient is a 59 year old woman who has undergone multiple vascular interventions in the past. She presented once again with thrombosis of her stents, and therefore does not appear to be a stent candidate. It is now elected to proceed with surgical bypass. Risks and benefits were reviewed. All questions were answered. The patient agrees to proceed.   DESCRIPTION OF PROCEDURE:  The patient is taken to the Operating Room and placed in the supine position. After adequate general anesthesia is induced and appropriate invasive monitors placed, she is positioned supine, and her right leg is prepped and draped in a circumferential fashion. Appropriate timeout is then called.   A linear incision is created in the right groin, and the dissection is carried down through the soft tissues to expose the femoral sheath. The femoral sheath is then opened, and the common femoral artery is identified. The common femoral artery is then dissected circumferentially, and looped proximally and distally.   Medial calf incision is then made, carried through the soft tissues, opening the fascia and entering the popliteal space. The popliteal artery is then identified. Crossing veins are all meticulously ligated with silk ties. The popliteal artery and tibioperoneal trunk are then dissected circumferentially and looped with Silastic vessel loops, as is the origin of the anterior  tibial.   A Gore tunneling device is then used to create a tunnel in the subsartorial plane from the calf incision to the groin incision, and a 6 mm ringed PTFE graft is passed through the tunneling device.   Then 5000 units of heparin is given and allowed to circulate for 5 minutes. The common femoral artery is then clamped proximally and distally. Arteriotomy is made, extended with Potts scissors, and 6-0 Prolene stay sutures are placed. The graft is beveled to an appropriate shape, and an end graft to side femoral artery anastomosis is fashioned using running CV6 suture. Flushing maneuvers are performed and flow is re-established to the profunda femoris.   The graft is then flushed copiously with heparinized saline and clamped just above the suture line. It is checked for length. Arteriotomy is made in the below-knee popliteal, extended with Potts scissors, and subsequently stay sutures of 6-0 Prolene are placed. The graft is beveled and an end graft to side popliteal artery anastomosis is fashioned with running CV6 suture.   Flushing maneuvers are performed and flow is then established to the distal right limb. The ankle is then palpated, and a 4/4+ posterior tibial pulse is identified.   Both wounds are inspected for hemostasis. Evicel with Surgicel is placed, and subsequently both wounds are closed in multiple layers using 2-0 running Vicryl followed by 3-0 running Vicryl followed by 4-0 Monocryl for subcuticular stitch in the groin, and Dermabond and surgical staples and a dressing in the calf.    The patient tolerated the procedure well. There were no immediate complications. Sponge and needle counts were correct. She was taken to the recovery area in  excellent condition.   ____________________________ Renford Dills, MD ggs:mr D: 12/28/2012 13:59:41 ET T: 12/28/2012 19:21:08 ET JOB#: [id number]  cc: Renford Dills, MD, <Dictator> Margaretann Loveless, MD  Renford Dills  MD ELECTRONICALLY SIGNED 01/21/2013 17:20

## 2015-02-12 NOTE — Op Note (Signed)
PATIENT NAME:  [name] [name], Leena MR#:  [id number] DATE OF BIRTH:  [birth year]  DATE OF PROCEDURE:  06/19/2013  PREOPERATIVE DIAGNOSIS:  Acute right lower extremity ischemia with occlusion of femoral-popliteal bypass, status post initial angiogram and placement of thrombolysis catheter earlier in the day.   POSTOPERATIVE DIAGNOSIS:  Acute right lower extremity ischemia with occlusion of femoral-popliteal bypass, status post initial angiogram and placement of thrombolysis catheter earlier in the day.    PROCEDURE:  1.  Angiogram through existing catheter.  2.  Mechanical rheolytic thrombectomy with the AngioJet Proxi catheter.  3.  Placement of infusion catheter for continued thrombolysis.   SURGEON:  Annice NeedyJason S. Dew, M.D.   ANESTHESIA:  Local with moderate conscious sedation.   ESTIMATED BLOOD LOSS:  Minimal.   INDICATION FOR PROCEDURE:  A 59 year old [place] female who is brought in early this morning for attempt at salvage with an occluded femoral-popliteal bypass graft.  She has had lysis running for about 8 hours and is brought back for a second look angiogram through an existing catheter.   DESCRIPTION OF PROCEDURE:  The patient's existing catheter were sterilely prepped and draped and a sterile surgical field was created.  The existing catheter was removed.  Imaging was performed through the sheath which showed continued occlusion of the femoral to popliteal bypass graft.  I then ran the AngioJet Proxi catheter for about 200 mL of effluent returned throughout the graft.  The graft at this point still had sluggish flow with residual thrombosis distally and with this I elected to replace a lytic catheter and run more thrombolysis overnight.  A similar catheter was placed, 135 total length, 50 working length and parked with the distal tip just into the posterior tibial artery.  It was re-secured with a suture.  The patient tolerated the procedure well and was taken to the recovery room in  stable condition.    ____________________________ Annice NeedyJason S. Dew, MD jsd:ea D: 06/19/2013 17:46:19 ET T: 06/20/2013 03:12:55 ET JOB#: [id number]  cc: Annice NeedyJason S. Dew, MD, <Dictator> Annice NeedyJASON S DEW MD ELECTRONICALLY SIGNED 06/25/2013 16:16

## 2015-02-12 NOTE — Op Note (Signed)
PATIENT NAME:  [name] [name], [name] [name] MR#:  [id number] DATE OF BIRTH:  [birth year]  DATE OF PROCEDURE:  10/31/2012  PREOPERATIVE DIAGNOSES:  1. Ischemic right lower extremity with occlusion of previous interventions and ischemic rest pain.  2. Poor venous access with need for infusion for thrombolysis.   POSTOPERATIVE DIAGNOSES:  1. Ischemic right lower extremity with occlusion of previous interventions and ischemic rest pain.  2. Poor venous access with need for infusion for thrombolysis.   PROCEDURES:  1. Ultrasound guidance for vascular access to right jugular vein.  2. Placement of right jugular triple lumen catheter.  3. Fluoroscopic guidance for placement of catheter.   SURGEON: Annice NeedyJason S. Dew, MD   ANESTHESIA: Local with moderate conscious sedation.   BLOOD LOSS: Minimal.   FLUOROSCOPY TIME: Less than one minute.   CONTRAST USED: None.   INDICATION FOR PROCEDURE: This is a female who we just placed a thrombolysis catheter. That portion of the procedure we dictated separately. She will need a good durable venous access given her continued infusion, thrombolysis and high risk of bleeding and other problems. Central line is planned.   DESCRIPTION OF THE PROCEDURE: The patient was laid flat on the bed, on the vascular interventional suite bed. Her neck and chest were sterilely prepped and draped and a sterile surgical field was created. Jugular vein was identified and found to be patent. It was then accessed under direct ultrasound guidance without difficulty with a Seldinger needle. A J-wire was placed and after a skin nick, the triple lumen catheter was placed over the wire and the wire was removed. The catheter was parked into the right atrium and was secured to the skin with 3 silk sutures at 18 cm. All three lumens withdrew blood well and flushed easily with heparinized saline.     ____________________________ Annice NeedyJason S. Dew, MD jsd:es D: 10/31/2012 12:20:09 ET T: 10/31/2012 12:32:51  ET JOB#: [id number]  cc: Annice NeedyJason S. Dew, MD, <Dictator> Annice NeedyJASON S DEW MD ELECTRONICALLY SIGNED 11/01/2012 8:33

## 2015-02-12 NOTE — Op Note (Signed)
PATIENT NAME:  [name] [name], [name] MR#:  [id number] DATE OF BIRTH:  [birth year]  DATE OF PROCEDURE:  10/31/2012  PREOPERATIVE DIAGNOSES:  1.  Peripheral arterial disease with occlusion of previous interventions. 2.  Status post lysis catheter for infusion of thrombolysis earlier in the day. 3.  Hypertension. 4.  Diabetes.     POSTOPERATIVE DIAGNOSES: 1.  Peripheral arterial disease with occlusion of previous interventions. 2.  Status post lysis catheter for infusion of thrombolysis earlier in the day. 3.  Hypertension. 4.  Diabetes.  PROCEDURE PERFORMED:   1.  Angiogram through existing catheter, right lower extremity. 2.  Replacement of infusion catheter for continuing thrombolysis overnight.   SURGEON: Annice NeedyJason S. Zoriana Oats, M.D.   ANESTHESIA: Local with moderate conscious sedation.   ESTIMATED BLOOD LOSS: Minimal.   INDICATION FOR PROCEDURE: This is a 59 year old African-American female with severe peripheral vascular disease. She had acute/chronic ischemic symptoms with ischemic rest pain last night and was admitted to the hospital. She was started on a heparin drip and brought down today for an attempt at limb salvage.  A thrombolysis catheter was placed earlier in the day and this was continued until this time and she is brought back for re-evaluation with angiography.   DESCRIPTION OF PROCEDURE: The patient's existing catheter was removed over a Magic torque wire. Imaging was performed through the Ansel sheath parked in the right common femoral artery. This demonstrated persistent thrombus present in the right superficial femoral artery throughout the stent. There was return of some flow through the stent, which was not present earlier in the day and she still had maintained runoff through the posterior tibial artery largely, as well as some through the peroneal artery. At this point, with the persistent thrombosis, I elected to replace the 90 mm total length, 50 mm working length infusion for  thrombolysis catheter with the distal endpoint parked just above the knee in the popliteal artery and will continue a TPA infusion overnight.  The catheter and sheath were secured with silk sutures and a sterile dressing was placed. The patient tolerated the procedure was taken to the recovery room in stable condition.    ____________________________ Annice NeedyJason S. Dashonda Bonneau, MD jsd:eg D: 10/31/2012 17:18:51 ET T: 10/31/2012 20:19:27 ET JOB#: [id number]  cc: Annice NeedyJason S. Tanvi Gatling, MD, <Dictator> Annice NeedyJASON S Riddik Senna MD ELECTRONICALLY SIGNED 11/01/2012 8:34

## 2015-02-12 NOTE — Consult Note (Signed)
   General Aspect 59 yo female with history of pvd s/p recurrent closure of R SFA now admitted for consideartion of surgical intervention. No prior cardiac history but strong risk factors to include family history , pvd and continued tobaccol abuse.   Physical Exam:  GEN well developed, well nourished, no acute distress   HEENT PERRL, hearing intact to voice   NECK supple   RESP normal resp effort  clear BS   CARD Regular rate and rhythm  Normal, S1, S2  Murmur   Murmur Systolic   Systolic Murmur axilla   ABD denies tenderness  normal BS   LYMPH negative neck, negative axillae   EXTR negative cyanosis/clubbing, negative edema   SKIN normal to palpation   NEURO cranial nerves intact, motor/sensory function intact   PSYCH A+O to time, place, person   Review of Systems:  Subjective/Chief Complaint RIght left pain   General: No Complaints   Skin: No Complaints   ENT: No Complaints   Eyes: No Complaints   Neck: No Complaints   Respiratory: No Complaints   Cardiovascular: No Complaints   Gastrointestinal: No Complaints   Genitourinary: No Complaints   Vascular: Calf pain with walking  Leg cramping   Musculoskeletal: No Complaints   Neurologic: No Complaints   Hematologic: No Complaints   Endocrine: No Complaints   Psychiatric: No Complaints   Review of Systems: All other systems were reviewed and found to be negative   Medications/Allergies Reviewed Medications/Allergies reviewed   Home Medications: Medication Instructions Status  Edarbyclor 40 mg-25 mg oral tablet 1 tab(s) orally once a day Active  Crestor 20 mg oral tablet 1 tab(s) orally once a day (at bedtime) Active  Cymbalta 60 mg oral delayed release capsule 1 cap(s) orally once a day Active  acetaminophen-hydrocodone 325 mg-5 mg oral tablet 1 tab(s) orally 2-3 times a day Active  Onglyza 5 mg oral tablet 2 tab(s) orally once a day Active  Lyrica 300 mg oral capsule 1 cap(s) orally 2 times a  day Active  acetaminophen-oxycodone 325 mg-7.5 mg oral tablet 1 tab(s) orally 2 times a day Active  metformin 500 mg oral tablet 1 tab(s) orally once a day Active  Aspirin Enteric Coated 81 mg oral delayed release tablet 1 tab(s) orally once a day Active  Calcium 600+D 600 mg-200 intl units oral tablet 1 tab(s) orally 2 times a day Active  amitriptyline 50 mg oral tablet 1 tab(s) orally once a day (at bedtime) Active  allopurinol 100 mg oral tablet 1 tab(s) orally once a day Active  clopidogrel 75 mg oral tablet 1 tab(s) orally once a day Active    No Known Allergies:    Impression 59 yo with history of pvd now with occluded r sfa. Has ridk for cad including diabetes, tobacco abuse, family history who now needs risk stratification prior to surgical intervention   Plan 1. COnitnue current meds 2. Myoview in am 3. Echo in am 4. Further recs after studies   Electronic Signatures: Dalia HeadingFath, Le Fer[place] A (MD)  (Signed 05-Mar-14 16:56)  Authored: General Aspect/Present Illness, History and Physical Exam, Review of System, Home Medications, Allergies, Impression/Plan   Last Updated: 05-Mar-14 16:56 by Dalia HeadingFath, Shalie Schremp A (MD)
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
## 2015-02-12 NOTE — Consult Note (Signed)
PATIENT NAME:  [name] [name], [name] [name] MR#:  [id number] DATE OF BIRTH:  January 22, 1956  DATE OF CONSULTATION:  10/30/2012  REFERRING PHYSICIAN:  Renford Dills, MD CONSULTING PHYSICIAN:  [name] [name]. [name] Pigeon, MD  REASON FOR CONSULTATION: Medical management.   PRESENTING COMPLAINT: Pain in right leg and foot.   HISTORY OF PRESENT ILLNESS: This is a 59 year old female with history of diabetes, hypertension, peripheral vascular disease, current smoker, has right lower limb arterial stent due to decreased circulation 6 months ago and following with Dr. Gilda Crease for that. She noticed for the last few days she had pain every time when she tried to walk, on the same side leg and foot, and which is getting progressively worse. She had an appointment this coming Friday with Dr. Gilda Crease, but the pain was so bad that she could not even walk to the bathroom and even while she touched the skin on her leg she feels pain, and that is why she decided to come to the Emergency Room today. Vascular service saw the patient and decreased circulation on the right side of the leg and foot, so they are admitting for possibly thrombolysis or restenting of the artery on the right side, and they called for the consult for medical management as she has multiple medical issues.   REVIEW OF SYSTEMS:  CONSTITUTIONAL: Negative for fever or weakness, but there is decreased ambulation due to pain in the right leg.  EYES: Denies any blurring or double lesion or any redness or inflammation of the eyes.  ENT: Denies any tinnitus, ear pain or hearing loss.  RESPIRATORY: Denies any cough, wheezing, hemoptysis or dyspnea.  CARDIOVASCULAR: Denies any chest pain, orthopnea, edema or arrhythmia.  GASTROINTESTINAL: Denies any nausea, vomiting, diarrhea, abdominal pain.  GENITOURINARY: Denies dysuria, hematuria or increased frequency.  SKIN: Denies any rashes, acne or lesions on the skin.  MUSCULOSKELETAL: Denies any pain or swelling in the  joints.  NEUROLOGICAL: Denies any numbness, weakness, tremor.  PSYCHIATRIC: Denies any anxiety, insomnia or depression.   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   PAST SURGICAL HISTORY: Splenectomy, stent placement in right lower limb artery due to blockages and decreased circulation 6 months ago.   ALLERGIES: No known drug allergies.  HOME MEDICATIONS: Onglyza 5 mg oral tablet once a day, metformin 500 mg oral once a day, Lyrica 300 mg oral capsule once a day, Edarbyclor 40 mg/25 mg tablet once a day, Cymbalta 60 mg oral delayed-release once a day, cyclobenzaprine 10 mg oral tablet 2 times a day, Crestor 20 mg oral tablet once a day, clopidogrel 75 mg oral once a day, calcium 600 mg, vitamin D 600 mg, aspirin enteric-coated 81 mg once a day, amitriptyline 50 mg once a day, allopurinol 100 mg oral once a day, acetaminophen 325 mg/5 mg oral once a day.   SOCIAL HISTORY: She is a current smoker and she tried quitting smoking a few months ago, but she could not. Drinking beer occasionally. Denies any illegal drug use.   FAMILY HISTORY: Positive for diabetes in multiple siblings.   PHYSICAL EXAMINATION:  VITAL SIGNS: Temperature 99.5, pulse rate 100, respiration 16, blood pressure 109/65 and pulse ox 92 on room air.  GENERAL: Fully alert, oriented to time, place and person and does not appear in any acute distress.  HEENT: Head and neck atraumatic. Conjunctivae pink. Oral mucosa moist.  NECK: Supple. No JVD.  CHEST: Bilateral clear and equal air entry.  CARDIOVASCULAR: S1, S2  present, regular. No murmur appreciated.  ABDOMEN: Soft, nontender. No organomegaly appreciated. Bowel sounds present.  EXTREMITIES: No edema on the legs. Right side dorsalis pedis and posterior tibial pulse decreased compared to the left. SKIN: The skin color is normal, same like left on right side also.  NEUROLOGICAL: 5/5 power in all 4 limbs. No  tremor or rigidity  PSYCHIATRIC: Does not appear in any acute psychiatric illness.   LABORATORY, DIAGNOSTIC AND RADIOLOGICAL DATA: Glucose 113, BUN 18, creatinine 1.38, sodium 146, potassium 3.1, chloride 107, CO2 of 30, calcium 9.6. WBC 7.7, hemoglobin 13.0, platelet count 298. Ultrasound flow vascular study: No evidence of DVT in right lower extremity. Right femoral artery stent is identified. Evaluation of the arterial system is limited. There is thrombus within the femur artery stent and if further clinical concern, recommend CTA of the lower extremity, recommend vascular surgery consultation.   ASSESSMENT AND PLAN:  1.  Peripheral vascular disease, status post stent: Decreased circulation on the right side, possible thrombosis of the stent. May need revascularization or stenting procedure. As per vascular surgery, she is admitted to Dr. Marijean HeathSchnier's service and they will follow. He started her on heparin IV drip and will do the procedure in the morning. Pain management as per him.  2.  Diabetes mellitus: We will start her on insulin sliding scale coverage as she will be nil per os in the night and will hold metformin for now.  3.  Hypertension: Losartan and hydrochlorothiazide. Monitor.  4.  Chronic renal failure: Creatinine 1.4. Will give IV fluids and follow renal function after the procedure. Advise to follow up in clinic with nephrology.  5.  Hypokalemia: Will replace orally today and check in the morning.  6.  Smoking: She is a current smoker. She does not need nicotine patch right now. Smoking cessation counseling done for 5 minutes.  7.  She says her primary medical doctor did a stress test 2 years ago which was normal. After that, she did not have any complaints like chest pain or palpitations while doing activities. Only thing limiting was her right lower limb pain.   CODE STATUS: Full code.   TOTAL TIME SPENT: 55 minutes.    ____________________________ [name] PigeonVaibhavkumar G. [name] PigeonVachhani,  MD vgv:jm D: 10/30/2012 17:30:46 ET T: 10/30/2012 17:48:36 ET JOB#: [id number]  cc: [name] PigeonVaibhavkumar G. [name] PigeonVachhani, MD, <Dictator> Altamese DillingVAIBHAVKUMAR Arieanna Pressey MD ELECTRONICALLY SIGNED 12/03/2012 17:23

## 2015-02-12 NOTE — Op Note (Signed)
PATIENT NAME:  [name] [name], [name] [name] MR#:  [id number] DATE OF BIRTH:  [birth year]  DATE OF PROCEDURE:  08/14/2013  PREOPERATIVE DIAGNOSES: 1.  Right below-knee amputation wound infection status post previous debridement.  2.  Peripheral artery disease.  3.  Hypertension.   POSTOPERATIVE DIAGNOSES:   1.  Right below-knee amputation wound infection status post previous debridement.  2.  Peripheral artery disease.  3.  Hypertension.   PROCEDURE:  Excisional debridement of soft tissue and muscle less than 25 sq cm, with VAC dressing placement.   SURGEON: Festus BarrenJason Katricia Prehn, MD  ASSISTANT:  Jenkins Rougehelsey Haney, PA.   ANESTHESIA: General.   ESTIMATED BLOOD LOSS: Minimal.   INDICATION FOR PROCEDURE: A 59 year old African American female with extensive peripheral vascular disease. She is status post below-knee amputation at another institution. She had a wound infection that has been debrided. She comes back for wound evaluation and VAC change.   DESCRIPTION OF PROCEDURE: Patient is brought to the operative suite and after adequate level of general anesthesia was obtained, the right lower extremity was sterilely prepped and draped and a sterile surgical field was created. A small amount of fibrinous exudate on the soft tissue and muscle was debrided back sharply with Metzenbaum scissors. Overall, the wound had an excellent granulation bed and no purulent drainage or erythema or signs of infection. At this point, a medium VAC sponge was cut to fit the wound. Strips were used for an occlusive seal and was connected to suction with a good seal. The patient was awakened from anesthesia and taken further in stable condition and tolerated the procedure well.    ____________________________ Annice NeedyJason S. Danell Verno, MD jsd:cc D: 08/14/2013 16:11:23 ET T: 08/14/2013 20:28:40 ET JOB#: [id number]  cc: Annice NeedyJason S. Lakaisha Danish, MD, <Dictator> Annice NeedyJASON S Bree Heinzelman MD ELECTRONICALLY SIGNED 08/20/2013 13:49

## 2015-02-12 NOTE — Op Note (Signed)
PATIENT NAME:  [name] [name], [name] [name] MR#:  [id number] DATE OF BIRTH:  1956-07-17  DATE OF PROCEDURE:  10/31/2012  PREOPERATIVE DIAGNOSES:  1.  Ischemic right lower extremity with rest pain.  2.  Occlusion of previous interventions, right leg.  3.  Diabetes.  4.  Hypertension.   POSTOPERATIVE DIAGNOSES: 1.  Ischemic right lower extremity with rest pain. 2.  Occlusion of previous interventions, right leg. 3.  Diabetes. 4.  Hypertension.  PROCEDURES PERFORMED:  1.  Ultrasound guidance for vascular access, left femoral artery.  2.  Catheter placed in the right popliteal artery from left femoral approach.  3.  Aortogram and selective right lower extremity angiogram.  4.  Placement of infusion for thrombolysis and administration of 2 mg of TPA through the infusion catheter to the right common femoral artery, superficial femoral artery and popliteal artery.   SURGEON: Annice NeedyJason S. Dew, M.D.   ANESTHESIA: Local with moderate conscious sedation.   ESTIMATED BLOOD LOSS: Minimal.   CONTRAST USED: 45 mL Visipaque.   INDICATION FOR PROCEDURE: This is a 59 year old African American female with known history of peripheral arterial disease. She is admitted with limb threatening ischemia of the right lower extremity with ischemic rest pain and noted malperfusion of the right foot. She is brought to Angiography for an attempt at limb salvage. Risks and benefits were discussed. Informed consent was obtained.   DESCRIPTION OF PROCEDURE: The patient is brought to the Vascular Interventional Radiology Suite. The groins were sterily prepped and draped, a sterile surgical field was created. The left femoral head was localized with fluoroscopy. Ultrasound was used to access the left femoral artery. This was done without difficulty with a Seldinger needle. A J-wire and 5-French sheath were placed. A pigtail catheter was placed in the aorta at the L1 level. This showed what appeared to be normal flow through the renals and  in the aorta and iliac segments.  She had some significant bowel gas and imaging quality was reasonably poor, but flow appeared reasonably brisk proximally. I then ________ the area of bifurcation and advanced ________ right femoral head. Selective right lower extremity angiogram was then performed. This demonstrated a flush occlusion of the right superficial femoral artery.  The profunda femoris artery was patent. I then continued imaging downward.  She reconstituted the popliteal artery just above the knee.  She had multiple previous stents placed in the SFA that were occluded. She then appeared to have posterior tibial runoff distally with filling defect in the proximal peroneal artery. It was unclear if this was old or new.  I was able to gain access to the superficial femoral artery with an advanced wire across the occlusion and gain access in the superficial femoral artery distally and confirm intraluminal flow with catheter placement. I then placed a 6-French Ansel sheath over the Terumo advantage wire, parked this with the distal tip just above the origin of the SFA.  A 90 cm total length, 50 cm working length infusion catheter was then placed. The proximal portion of this was within the sheath with the distal portion terminating basically at the level of the knee, intraluminally.   2 mg of TPA was then instilled through this catheter. It was connected to a continuous TPA infusion.  The sheath and the catheter then secured to the leg with a Prolene suture. A sterile dressing was placed. The patient tolerated the procedure well and was taken to the recovery room in stable condition.     ____________________________ Annice NeedyJason S. Dew,  MD jsd:eg D: 10/31/2012 16:40:13 ET T: 10/31/2012 20:08:50 ET JOB#: [id number]  cc: Annice Needy, MD, <Dictator> Annice Needy MD ELECTRONICALLY SIGNED 11/01/2012 8:34

## 2015-02-12 NOTE — Discharge Summary (Signed)
PATIENT NAME:  [name] [name], [name] [name] MR#:  [id number] DATE OF BIRTH:  Sep 01, 1956  DATE OF ADMISSION:  12/24/2012 DATE OF DISCHARGE:  01/01/2013  ADMITTING DIAGNOSES:  1.  Peripheral arterial disease with rest pain on the right. 2.  Tobacco dependence. 3.  Diabetes mellitus. 4.  Hypertension.  DISCHARGE DIAGNOSES:  1.  Peripheral arterial disease with rest pain on the right. 2.  Tobacco dependence. 3.  Diabetes mellitus. 4.  Hypertension.  CONSULTATIONS:  Cardiology for cardiac clearance and primary physician for medical management.  HOSPITAL PROCEDURES:  On 12/25/2012 right lower extremity angiogram and 12/27/2012 right femoral to below-knee popliteal bypass with PTFE.   BRIEF HISTORY:  The patient is a 59 year old woman presenting to the ER with rest pain of the right lower extremity and found to have occlusion of previously placed stent.  She has had multiple interventions to the right lower extremity in the past.  She had angiogram finding occlusion of the right SFA.  Following this, she had femoral to popliteal bypass during her hospital stay.   HOSPITAL COURSE:  The patient was admitted with peripheral arterial disease and rest pain to the right lower extremity.  She had a right lower extremity angiogram on 12/25/2012 finding occlusion of the right SFA, previous placed stents and distal popliteal was patent.  She was anticoagulated with a heparin drip.  She had primary care physician consult and cardiology consult for medical management and cardiac clearance.  On 12/27/2012 she had right femoral to below the knee popliteal bypass with PTFE.  She did well postoperatively.  She was put on a heparin drip postoperatively as well and started on Coumadin.  She remained on the heparin drip until her INR was therapeutic at 2.5 on 01/01/2013.  Physical therapy worked with her to increase ambulation postoperatively.  She was ambulating well with a walker on 01/01/2013 and felt safe and steady to go home at  this point.  Tolerating diet.  Pain controlled with medication.  Incisions healing well.  She will be discharged with physical therapy at home and with a walker.    DIET:  Regular.  ACTIVITY:  As tolerated.   DISCHARGE MEDICATIONS:  Please see discharge instructions.    FOLLOW-UP INSTRUCTIONS:  Physical therapy at home was ordered.  She has a walker to go home with.  We will plan to see her next week in the office for an INR check.  She was placed on Coumadin 5 mg daily to be discharged on.  Given prescription for Percocet for pain as well.  We will plan to see her next week in the office.     ____________________________ Hoyle Sauerhelsea N. Hanne, PA-C cnh:ea D: 01/01/2013 19:23:38 ET T: 01/02/2013 04:36:05 ET JOB#: [id number]  cc: Hoyle Sauerhelsea N. Hanne, PA-C, <Dictator> CHELSEA N HANNE PA ELECTRONICALLY SIGNED 02/13/2013 8:55

## 2015-02-12 NOTE — Consult Note (Signed)
PATIENT NAME:  [name] [name], Sofhia MR#:  [id number] DATE OF BIRTH:  [birth year]  CARDIOLOGY CONSULTATION REPORT  DATE OF CONSULTATION:  04/06/2013  CONSULTING PHYSICIAN:  Laurier NancyShaukat A. Harol Shabazz, MD  INDICATION FOR CONSULTATION: Near-syncope.   HISTORY OF PRESENT ILLNESS: This is a 59 year old African American female with a past medical history of diabetes, hypertension, peripheral vascular disease status post bypass, on Coumadin, who presented to the Emergency Room after she almost passed out. The patient apparently was dehydrated and was walking in a [employer] when she all of a sudden fell and passed out, and her blood pressure in the ER was 97/60. Her hemoglobin was 7.4, which has dropped from 9.6 in April 2014. Her INR was 1.7. She, right now, feels much better. She denies any chest pain or shortness of breath, but she does admit to having chest pain right after she passed out.   PAST MEDICAL HISTORY: History of diabetes, hypertension, peripheral vascular disease; is on Coumadin, splenectomy, history of low back pain.   ALLERGIES: None.  HOME MEDICATIONS:  Tramadol, oxycodone, Lyrica, Coumadin 4 mg, Plavix 75 mg, and aspirin 81 mg once a day.   FAMILY HISTORY: Positive for coronary artery disease.   PHYSICAL EXAMINATION: GENERAL: She is alert, oriented x 3, in no acute distress.  VITAL SIGNS: Her blood pressure is 121/74 respirations are 20, pulse is 80, temperature 98.6, saturation is 96.  HEENT EXAM: Revealed no JVD.  LUNGS: Clear.  HEART: Regular rate and rhythm. Normal S1, S2. No audible murmur.  ABDOMEN: Soft, nontender, positive bowel sounds.  EXTREMITIES: No pedal edema.  NEUROLOGIC: The patient appears to be intact.   Her BUN is 17, creatinine 1.39, hemoglobin is 8. She has been transfused. White count is 6.8. Creatinine today is much better, 1.39, CPK is 371. Troponin is 0.04.   Yesterday her creatinine was 2.1 and BUN was 26.   Yesterday her hemoglobin was also 7.4.   LABS/STUDIES:  EKG shows normal sinus rhythm, 93 beats per minute, LVH with nonspecific ST-T changes.   ASSESSMENT AND PLAN: Mildly elevated troponin due to demand ischemia, near-syncope due to patient being anemic and dehydrated. She has been hydrated now and she has received blood transfusion. Hemoglobin is 8, but advise transfusing her to maybe 9 or 10, give another    1 or 2 units of blood, and then she can go home with followup in the office and we will do a Lexiscan, Myoview in the office because she did have chest pain after she passed out. She may have coronary artery disease.   Thank you very much for the referrals.     ____________________________ Laurier NancyShaukat A. Landri Dorsainvil, MD sak:dm D: 04/06/2013 10:09:19 ET T: 04/06/2013 10:47:45 ET JOB#: [id number]  cc: Laurier NancyShaukat A. Michayla Mcneil, MD, <Dictator> Laurier NancySHAUKAT A Romie Tay MD ELECTRONICALLY SIGNED 04/29/2013 16:31

## 2015-02-12 NOTE — H&P (Signed)
Subjective/Chief Complaint cold right leg    History of Present Illness The patient is a 59 yo woman who presents to the ER with c/o of increasing coldness and increasing pain in the right foot and leg.  She has a history of multiple interventions on the right for similar symptoms.  She was last seen in the office in October 2013 at which time her duplex scan and ABI's were without evidence of hemodynamically significant stenosis.  She denies recent illness, no recent trauma.   Past Med/Surgical Hx:  Arthritis:   HTN:   Diabetes Mellitus, Type II (NIDD):   Angioplasty: Right leg  Lumbar Discectomy:   Splenectomy:   ALLERGIES:  No Known Allergies:   HOME MEDICATIONS: Medication Instructions Status  Edarbyclor 40 mg-25 mg oral tablet 1 tab(s) orally once a day Active  Crestor 20 mg oral tablet 1 tab(s) orally once a day (at bedtime) Active  Cymbalta 60 mg oral delayed release capsule 1 cap(s) orally once a day Active  cyclobenzaprine 10 mg oral tablet 1 tab(s) orally 2 times a day Active  acetaminophen-hydrocodone 325 mg-5 mg oral tablet 1 tab(s) orally 2-3 times a day Active  Onglyza 5 mg oral tablet 1 tab(s) orally once a day Active  metformin 500 mg oral tablet 1 tab(s) orally once a day Active  Aspirin Enteric Coated 81 mg oral delayed release tablet 1 tab(s) orally once a day Active  Calcium 600+D 600 mg-200 intl units oral tablet 1 tab(s) orally 2 times a day Active  amitriptyline 50 mg oral tablet 1 tab(s) orally once a day (at bedtime) Active  allopurinol 100 mg oral tablet 1 tab(s) orally once a day Active  Lyrica 300 mg oral capsule 1 cap(s) orally once a day (at bedtime) Active  clopidogrel 75 mg oral tablet 1 tab(s) orally once a day Active   Family and Social History:   Social History positive  tobacco, negative ETOH, negative Illicit drugs    + Tobacco Current (within 1 year)    Place of Living Home   Review of Systems:   Subjective/Chief Complaint pain right  foot    Fever/Chills No    Cough No    Sputum No    Abdominal Pain No    Diarrhea No    Constipation No    Nausea/Vomiting No    SOB/DOE No    Chest Pain No    Telemetry Reviewed NSR    Dysuria No   Physical Exam:   GEN well developed, well nourished, no acute distress    HEENT PERRL, hearing intact to voice, moist oral mucosa    NECK supple  trachea midline    RESP normal resp effort  no use of accessory muscles    CARD regular rate  positive carotid bruits  no JVD    ABD denies tenderness  denies Flank Tenderness  soft    EXTR negative cyanosis/clubbing, negative edema, right foot cool with slugish cap refill, pedal pulses on the right are nonpalpable and on the  left ;2+ left DP  1+ PT    SKIN No rashes, No ulcers    NEURO cranial nerves intact, follows commands, motor/sensory function intact    PSYCH alert, A+O to time, place, person   Lab Results: Hepatic:  08-Jan-14 14:09    Bilirubin, Total  0.1   Alkaline Phosphatase 64   SGPT (ALT) 58   SGOT (AST)  60   Total Protein, Serum  6.0   Albumin,  Serum  2.9  Routine Chem:  08-Jan-14 14:09    Glucose, Serum  113   BUN 18   Creatinine (comp)  1.38   Sodium, Serum  146   Potassium, Serum  3.1   Chloride, Serum 107   CO2, Serum 30   Calcium (Total), Serum 9.6   Osmolality (calc) 293   eGFR (African American)  49   eGFR (Non-African American)  42 (eGFR values <86m/min/1.73 m2 may be an indication of chronic kidney disease (CKD). Calculated eGFR is useful in patients with stable renal function. The eGFR calculation will not be reliable in acutely ill patients when serum creatinine is changing rapidly. It is not useful in  patients on dialysis. The eGFR calculation may not be applicable to patients at the low and high extremes of body sizes, pregnant women, and vegetarians.)   Anion Gap 9  Routine Coag:  08-Jan-14 14:09    Prothrombin 13.0   INR 0.9 (INR reference interval applies to patients  on anticoagulant therapy. A single INR therapeutic range for coumarins is not optimal for all indications; however, the suggested range for most indications is 2.0 - 3.0. Exceptions to the INR Reference Range may include: Prosthetic heart valves, acute myocardial infarction, prevention of myocardial infarction, and combinations of aspirin and anticoagulant. The need for a higher or lower target INR must be assessed individually. Reference: The Pharmacology and Management of the Vitamin K  antagonists: the seventh ACCP Conference on Antithrombotic and Thrombolytic Therapy. CLEXNT.7001Sept:126 (3suppl): [id number] A HCT value >55% may artifactually increase the PT.  In one study,  the increase was an average of 25%. Reference:  "Effect on Routine and Special Coagulation Testing Values of Citrate Anticoagulant Adjustment in Patients with High HCT Values." American Journal of Clinical Pathology 2006;126:400-405.)   Activated PTT (APTT) 30.9 (A HCT value >55% may artifactually increase the APTT. In one study, the increase was an average of 19%. Reference: "Effect on Routine and Special Coagulation Testing Values of Citrate Anticoagulant Adjustment in Patients with High HCT Values." American Journal of Clinical Pathology 2006;126:400-405.)  Routine Hem:  08-Jan-14 14:09    WBC (CBC) 7.7   RBC (CBC) 4.33   Hemoglobin (CBC) 13.0   Hematocrit (CBC) 40.4   Platelet Count (CBC) 298 (Result(s) reported on 30 Oct 2012 at 02:43PM.)   MCV 93   MCH 30.0   MCHC 32.2   RDW 12.9     Assessment/Admission Diagnosis 1.  Ischemic right leg          patient will be admitted and started on heparin          TPA will be planned and intervention for limb salvage 2.  Hypertension           antihypertensive meds will be continues 3.  Diabetes           sliding scale insluin for now 4.  Hyperlipidemia            will continue statin 5.  Gouty arthritis             will hold meds for now    Plan level  3 H&P   Electronic Signatures: SHortencia Pilar(MD)  (Signed 08-Jan-14 17:16)  Authored: CHIEF COMPLAINT and HISTORY, PAST MEDICAL/SURGIAL HISTORY, ALLERGIES, HOME MEDICATIONS, FAMILY AND SOCIAL HISTORY, REVIEW OF SYSTEMS, PHYSICAL EXAM, LABS, ASSESSMENT AND PLAN   Last Updated: 08-Jan-14 17:16 by SHortencia Pilar(MD)

## 2015-02-12 NOTE — Op Note (Signed)
PATIENT NAME:  [name] [name], [name] [name] MR#:  [id number] DATE OF BIRTH:  09/01/1956  DATE OF PROCEDURE:  11/01/2012  PREOPERATIVE DIAGNOSES:  1.  Ischemia, right leg.  2.  Atherosclerotic occlusive disease, bilateral lower extremities with rest pain.  3.  Stricture stenosis of artery.  4.  Complication vascular device with occlusion of stent, right superior femoral artery.  POSTOPERATIVE DIAGNOSES: 1.  Ischemia, right leg. 2.  Atherosclerotic occlusive disease, bilateral lower extremities with rest pain. 3.  Stricture stenosis of artery. 4.  Complication vascular device with occlusion of stent, right superior femoral artery.   PROCEDURES PERFORMED:  1.  Right lower extremity distal angiography, third order catheter placement.  2.  Percutaneous transluminal angioplasty with Viabahn stent placement, right superficial femoral artery.  3.  Perclose closure of left common femoral puncture site.   SURGEON: Renford DillsGregory G. Schnier, M.D.   SEDATION:  Versed 5 mg plus fentanyl 200 mcg administered IV. Continuous ECG, pulse oximetry and cardiopulmonary monitoring was performed throughout the entire procedure by the interventional radiology nurse. Total sedation time is one hour.   ACCESS: Existing 6-French sheath upsized to a 7-French sheath.   CONTRAST USED: Isovue 80 mL.   FLUOROSCOPY TIME: Approximately 10 minutes.   INDICATIONS: [name] is a 59 year old woman who presented to the Emergency Room with one week of rest pain in her right foot. She has had multiple interventions in the past and has several superficial femoral artery stents placed. Physical examination confirmed. She had thrombosed or otherwise occluded these stents and she was admitted and has been undergoing arterial lysis for limb salvage. The risks and benefits were reviewed. All questions answered. The patient has agreed to proceed.   DESCRIPTION OF PROCEDURE: The patient is taken to the Special Procedures Suite and from the Intensive  Care Unit. TPA is turned off. The infusion catheter is then prepped and draped in a sterile fashion. The obturator wire is then removed and a Magic torque wire is advanced down under fluoroscopic guidance through the infusion catheter and the infusion catheter is removed. With the 6-French Ansel sheath sitting in the common femoral artery, hand injection of contrast is performed showing the superficial femoral artery, as well as the distal runoff. Review of these images demonstrates that there are multiple greater than 80% stenoses throughout the length of the stent. The above-knee popliteal was reconstituted and the tibial vessels demonstrate single vessel runoff via the posterior tibial down to the foot filling the pedal arch.   A 6 x 20 Dorado balloon is then advanced across the SFA beginning at the cortical margin of the distal femur.  Three serial inflations are performed, each to 18 to 20 atmospheres for 1 to 1-1/2 minutes. Follow-up angiography demonstrates multiple areas that are suboptimally treated and/or dissection at the distal margin of the stent in the proximal popliteal. Therefore, it was elected to restent this area, given the suboptimal results and this time Viabahn will be utilized in an attempt to exclude the in-stent restenosis.   A KMP catheter was used to exchange the 0.035 catheter for an 0.018 platinum plus wire and subsequently a 6 x 10 Viabahn and then a 7 x 15, a 7 x 2.5 and then a 7 x 10 Viabahn were all deployed covering the affected area. They were then post dilated with a 6 x 20 Dorado balloon. Several inflations were performed to 12 to 18 atmospheres. Follow-up angiography including oblique views of the groin demonstrates that the common femoral, profunda femoris and  superficial femoral artery are now widely patent. Distal runoff was preserved as single tibial runoff via the posterior tibial. The pedal arches fill.   The wire is removed. The sheath is then pulled into the left  common iliac system. J-wire is advanced and subsequently, a ProGlide device is used.  The initial one does not capture the stitch and a second device is used after the J-wire is reintroduced and this one works without difficulty with excellent hemostasis. Light pressure is held for several minutes and a safeguard is applied. The patient tolerated the procedure well and there were no immediate complications. At the conclusion of the procedure she has a palpable posterior tibial pulse.   INTERPRETATION: Initial views demonstrate the common femoral and profunda femoris are widely patent. The superficial femoral artery demonstrates diffuse disease of greater than 75 to 80% throughout the course of the stented SFA. There is some high-grade stenoses in the proximal popliteal extending to the level of the cortical margin of the femur. The mid and distal popliteal are widely patent.  Trifurcation demonstrates extensive disease to be appearing in the trunk, and posterior tibial are widely patent and actually of reasonable caliber down to the foot, filling the lateral plantar and the pedal arch. Anterior tibial is present in its proximal one third.  It is highly diseased and then it occludes and does not reconstitute nor does the dorsalis pedis.  The peroneal is occluded throughout its course.   Following angioplasty there is suboptimal result and therefore, Viabahn stents were used with an excellent result after post placement dilation to 6 mm.   SUMMARY: Successful limb salvage of the right lower extremity, as described above.  ____________________________ Renford Dills, MD ggs:eg D: 11/01/2012 14:20:20 ET T: 11/01/2012 18:18:39 ET JOB#: [id number]  cc: Renford Dills, MD, <Dictator> Margaretann Loveless, MD Munsoor Lizabeth Leyden, MD   Renford Dills MD ELECTRONICALLY SIGNED 11/15/2012 10:05
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
## 2015-02-12 NOTE — Consult Note (Signed)
CHIEF COMPLAINT and HISTORY:  Subjective/Chief Complaint stump drainage   History of Present Illness Patient about a month s/p right BKA in [place].  She has had multiple revascularization procedures done here previously.  She presents with several days of drainage from the lateral portion of the stump.  there is a necrotic eschar laterally with some fluctuance and drainage.  The mid portion and medial portion of the wound are intact and look good. No fever.  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:    Arthritis:    HTN:    Diabetes Mellitus, Type II (NIDD):    stent in right leg:    Angioplasty:    Lumbar Discectomy:    Splenectomy:   ALLERGIES:  Allergies:  No Known Allergies:   HOME MEDICATIONS:  Home Medications: Medication Instructions Status  amitriptyline 50 mg oral tablet 1 tab(s) orally once a day (at bedtime) Active  allopurinol 100 mg oral tablet 1 tab(s) orally once a day Active  Lyrica 300 mg oral capsule 1 cap(s) orally 2 times a day Active  Onglyza 2.5 mg oral tablet 1 tab(s) orally once a day Active  DULoxetine 60 mg oral delayed release capsule 1 cap(s) orally once a day Active  Edarbyclor 40 mg-12.5 mg oral tablet 1 tab(s) orally once a day Active  Crestor 20 mg oral tablet 1 tab(s) orally once a day (in the morning) Active  warfarin 2 mg oral tablet 1.5 tab(s) orally once a day Active  methocarbamol 500 mg oral tablet 1 tab(s) orally 4 times a day Active  Calcium 600+D 600 mg-200 units oral tablet 1 tab(s) orally 2 times a day Active  pantoprazole 40 mg oral delayed release tablet 1 tab(s) orally 2 times a day Active  oxyCODONE 15 mg oral tablet 1 tab(s) orally every 6 hours, As Needed Active  docusate sodium sodium 100 mg oral capsule 1 cap(s) orally once a day Active   Family and Social History:  Family History  Hypertension  Diabetes Mellitus   Social History positive  tobacco (Current within 1 year), negative ETOH   Place of Living Home   Review of Systems:  Subjective/Chief Complaint stump pain and drainage   Fever/Chills No   Cough No   Sputum No   Abdominal Pain No   Diarrhea No   Constipation No   Nausea/Vomiting No   SOB/DOE No   Chest Pain No   Telemetry Reviewed NSR   Dysuria No   Tolerating PT Yes   Tolerating Diet Yes   Physical Exam:  GEN well developed, well nourished   HEENT pink conjunctivae, moist oral mucosa   NECK No masses  trachea midline   RESP normal resp effort  no use of accessory muscles   CARD regular rate  no JVD   VASCULAR ACCESS none   ABD denies tenderness  soft   GU no superpubic tenderness   LYMPH negative neck, negative axillae   EXTR right BKA stump as described above   SKIN as above   NEURO cranial nerves intact, motor/sensory function intact   PSYCH alert, A+O to time, place, person   LABS:  Laboratory Results: Hepatic:    02-Oct-14 14:13, Comprehensive Metabolic Panel  Bilirubin, Total 0.5  Alkaline Phosphatase 97  SGPT (ALT) 23  SGOT (AST) 27  Total Protein, Serum 6.6  Albumin, Serum 2.5  Routine Micro:  02-Oct-14 14:13, Blood Culture  Micro Text Report   BLOOD CULTURE    COMMENT                   NO GROWTH IN 8-12 HOURS     ANTIBIOTIC  Culture Comment   NO GROWTH IN 8-12 HOURS   Result(s) reported on 24 Jul 2013 at 10:00PM.  Micro Text Report   BLOOD CULTURE    COMMENT                   NO GROWTH IN 8-12 HOURS     ANTIBIOTIC  Culture Comment   NO GROWTH IN 8-12 HOURS   Result(s) reported on 24 Jul 2013 at 10:00PM.  Routine Chem:    02-Oct-14 14:13, Comprehensive Metabolic Panel  Glucose, Serum 102  BUN 15  Creatinine (comp) 1.16  Sodium, Serum 138  Potassium, Serum 3.4  Chloride, Serum 103  CO2, Serum 29  Calcium (Total), Serum 9.3  Osmolality (calc) 277  eGFR (African American) >60   eGFR (Non-African American) 52  eGFR values <32m/min/1.73 m2 may be an indication of chronic  kidney disease (CKD).  Calculated eGFR is useful in patients with stable renal function.  The eGFR calculation will not be reliable in acutely ill patients  when serum creatinine is changing rapidly. It is not useful in   patients on dialysis. The eGFR calculation may not be applicable  to patients at the low and high extremes of body sizes, pregnant  women, and vegetarians.  Anion Gap 6    03-Oct-14 033:83 Basic Metabolic Panel (w/Total Calcium)  Glucose, Serum 89  BUN 11  Creatinine (comp) 1.14  Sodium, Serum 141  Potassium, Serum 3.3  Chloride, Serum 107  CO2, Serum 28  Calcium (Total), Serum 8.6  Anion Gap 6  Osmolality (calc) 280  eGFR (African American) >60  eGFR (Non-African American) 53  eGFR values <636mmin/1.73 m2 may be an indication of chronic  kidney disease (CKD).  Calculated eGFR is useful in patients with stable renal function.  The eGFR calculation will not be reliable in acutely ill patients  when serum creatinine is changing rapidly. It is not useful in   patients on dialysis. The eGFR calculation may not be applicable  to patients at the low and high extremes of body sizes, pregnant  women, and vegetarians.  Routine Coag:    02-Oct-14 14:13, Prothrombin Time  Prothrombin 17.1  INR 1.4  INR reference interval applies to patients on anticoagulant therapy.  A single INR therapeutic range for coumarins is not optimal for all  indications; however, the suggested range for most indications is  2.0 - 3.0.  Exceptions to the INR Reference Range may include: Prosthetic heart  valves, acute myocardial infarction, prevention of myocardial  infarction, and combinations of aspirin and anticoagulant. The need  for a higher or lower target INR must be assessed individually.  Reference: The Pharmacology and Management of the Vitamin K   antagonists: the seventh ACCP  Conference on Antithrombotic and  Thrombolytic Therapy. [id number] (3suppl): [id number] A HCT value >55% may artifactually increase the PT.  In one study,   the increase was an average of 25%.  Reference:  "Effect on Routine and Special Coagulation Testing Values  of Citrate Anticoagulant Adjustment in Patients with High HCT Values."  American Journal of Clinical Pathology 2006;126:400-405.  Routine Hem:    02-Oct-14 14:13, Hemogram, Platelet Count  WBC (CBC) 11.7  RBC (CBC) 4.21  Hemoglobin (CBC)  11.6  Hematocrit (CBC) 35.4  Platelet Count (CBC) 337  Result(s) reported on 24 Jul 2013 at 02:28PM.  MCV 84  MCH 27.6  MCHC 32.8  RDW 16.7    03-Oct-14 05:59, CBC Profile  WBC (CBC) 10.9  RBC (CBC) 3.88  Hemoglobin (CBC) 11.0  Hematocrit (CBC) 32.5  Platelet Count (CBC) 293  MCV 84  MCH 28.3  MCHC 33.7  RDW 16.6  Neutrophil % 75.1  Lymphocyte % 13.2  Monocyte % 10.4  Eosinophil % 0.7  Basophil % 0.6  Neutrophil # 8.2  Lymphocyte # 1.4  Monocyte # 1.1  Eosinophil # 0.1  Basophil # 0.1  Result(s) reported on 25 Jul 2013 at 06:46AM.   ASSESSMENT AND PLAN:  Assessment/Admission Diagnosis right BKA stump infection   Plan would continue IV antibiotics. Her infection is partially drained, and will likely continue to drain some.  she is clinically stable and has no signs of sepsis.  With the eschar largely intact, will hold on debridement for now and see if this will dry up with antibiotics and its current drainage.  If we open the wound and remove the eschar, there is a high risk of eventual conversion to AKA.  Would dress the wound bid with dry or moist to dry dressings.   There is not vascular coverage this weekend, but unless she has a major clinical change there is unlikely to be any need for urgent surgical exploration.     level 4   Electronic Signatures: Algernon Huxley (MD)  (Signed 03-Oct-14 08:47)  Authored: Chief Complaint and History, PAST MEDICAL/SURGICAL  HISTORY, ALLERGIES, HOME MEDICATIONS, Family and Social History, Review of Systems, Physical Exam, LABS, Assessment and Plan   Last Updated: 03-Oct-14 08:47 by Algernon Huxley (MD)

## 2015-02-12 NOTE — H&P (Signed)
PATIENT NAME:  [name] [name], [name] [name] MR#:  [id number] DATE OF BIRTH:  20-Dec-1955  DATE OF ADMISSION:  09/24/2013  PRIMARY CARE PHYSICIAN:  Dr. Yves Dill.   REFERRING PHYSICIAN:  Dr. Zenda Alpers.   CHIEF COMPLAINT:  Altered mental status.   HISTORY OF PRESENT ILLNESS:  The patient is a 59 year old African American female with past medical history of diabetes mellitus and peripheral vascular disease, is brought into the ER for altered mental status.  The patient was admitted to the hospital in October of 2014 for discharge from recent right leg stump surgery.  She was discharged with right below-knee amputation wound with antibiotics to [facility] for rehabilitation, but she was readmitted to the hospital on 09/15/2013 and had above-knee amputation for nonhealing right below-knee amputation stump infection.  The patient was again sent back to [facility].  The patient was doing okay until yesterday and medical staff has noticed the patient being with altered mental status yesterday afternoon.  The patient was also hypotensive with low blood pressure and tachycardic at her arrival.  Temperature was 101.7 and initial blood pressure was at around 72/45.  The patient has received fluid boluses with no significant improvement.  The above-knee amputation site still has staples and as reported by the ER staff, it has foul-smelling discharge and the dressing looked very dirty and was not changed since the patient was discharged on November 24th.  Her staples were intact during my examination, but the site looks tender and erythematous.  The patient was given IV fluids.  After obtaining blood cultures and urine cultures, she was given one dose of IV Zyvox as she has history of VRE and IV Zosyn to cover her pneumonia.  Initially the patient was unconscious and unarousable, but eventually after giving fluid boluses and antibiotics during my examination, though she was still lethargic she was arousable with  touch and verbal commands.  No family members are available at bedside.  I was unable to get any history from the patient as she was quite lethargic.  The patient's BUN is at 68 and creatinine 3.89 and her baseline creatinine was at around 1.1.  Baseline creatinine is 1.0 during the previous admission in November.  As the patient was still hypotensive and tachycardic, surgical consult was placed and right IJ central line was placed by Dr. Michela Pitcher.  The ER physician has called on-call vascular surgeon, Dr. Gilda Crease regarding the concern for this above-knee amputation stump infection.  He has recommended to admit the patient to hospitalist service and start antibiotics and put a consult to them.   PAST MEDICAL HISTORY:  Recent history of right above-knee amputation, peripheral arterial disease, diabetes mellitus, tobacco dependence, osteoarthritis, history of MDRO in the past, nicotine addiction, chronic low back pain, history of renal insufficiency in the past, had bypass grafting as well as stent placements in the lower extremity for peripheral vascular disease.   PAST SURGICAL HISTORY:  Bypass grafting and stent placement for peripheral vascular disease in the lower extremities, splenectomy, peripheral vascular disease with multiple procedures, recent above-knee amputation on the right side on November 24th, prior to that below-knee amputation of the right knee.   ALLERGIES:  No known drug allergies.   PSYCHOSOCIAL HISTORY:  Currently residing at [facility], still smokes cigarettes per the old records.  No alcohol or drug usage.   FAMILY HISTORY:  Positive for hypertension.   REVIEW OF SYSTEMS:  Unobtainable.   CURRENT MEDICATIONS:  Protonix 40 mg by mouth 2 times  a day, oxycodone 15 mg every 6 hours, Onglyza 2.5 mg 1 tablet by mouth once daily, methocarbamol 500 mg 4 times a day, Lyrica 300 mg 2 times a day, hydrochlorothiazide 25 mg 1 tablet by mouth once daily, allopurinol 100 mg once  daily, amitriptyline 50 mg 1 tablet by mouth once daily, calcium 1 tablet by mouth 2 times a day, Crestor 20 mg at bedtime, Colace as needed basis, duloxetine 60 mg by mouth once daily, Edarbyclor 40/12.5 once daily.   PHYSICAL EXAMINATION: VITAL SIGNS:  Temperature 98.4, pulse 82, respirations 18, blood pressure is 118/71, pulse ox 99%.  GENERAL APPEARANCE:  Not under acute distress, very lethargic.  Arousable to touch sensation and verbal commands, but falling asleep immediately.  HEENT:  Normocephalic, atraumatic.  Pupils are equally reacting to light and accommodation, but they are responding sluggishly.  No scleral icterus.  No conjunctival injection.  Very dry mucous membranes.  NECK:  Supple.  No JVD.  No thyromegaly.  LUNGS:  Coarse breath sounds with minimal wheezing and crackles noted.  No anterior chest wall tenderness on palpation.  CARDIAC:  S1, S2 normal.  Regular rate and rhythm, tachycardic.  GASTROINTESTINAL:  Soft.  Bowel sounds are positive in all four quadrants.  Nontender, nondistended.  No masses felt.  NEUROLOGIC:  Arousable with deep touch sensation and loud voices.  Motor and sensory could not be elicited as the patient is disoriented.  Reflexes are 2+.  EXTREMITIES:  The right above-knee amputation site is intact with staples, but with edema, tenderness and erythema.  Also has noticed foul-smelling discharge from the wound, which was cleaned.  Left lower extremity decrease peripheral pulses with 1+ dorsalis pedis and posterior tibialis.  SKIN:  Warm to touch.  Dry in nature.  No rashes.  No lesions.  PSYCHIATRIC:  Mood and affect could not be elicited as the patient is with altered mental status.   LABORATORY AND DIAGNOSTIC DATA:  BMP, glucose 129, BUN 68, creatinine 3.89 which was at 1.08 on November 25th, sodium 136, potassium 4.1, chloride 100, CO2 26, anion gap is 10, GFR 14.  Serum osmolality 293.  Calcium 9.1.  WBC 14.6, hemoglobin 8.6, hematocrit 26.9, platelets 440.   PT 15.7, INR 1.2, PTT 35.2.   ASSESSMENT AND PLAN:  A 59 year old African American female brought into the Emergency Room with altered mental status from [facility] will be admitted with the following assessment and plan:  1.  Altered mental status, is most probably from systemic inflammatory reactive syndrome from pneumonia and probably infected above-knee amputation surgical site on the right.  We will admit the patient to CCU.  Continue IV fluids and pressors for hypotension.  Tylenol as needed for temperature.  Pan cultures were obtained.  We will check a.m. labs.  Continue IV Zyvox, Zosyn and azithromycin.  Surgical consult is placed to Dr. Gilda Crease who is on call for Dr. Wyn Quaker.   2.  Acute kidney injury from dehydration, status post IV fluids and continue pressors.  The patient started making urine.  3.  Peripheral vascular disease, status post stent placement in the past with bypass surgery.  4.  Diabetes mellitus.  The patient currently being lethargic, will be on sliding scale insulin.  When she is more awake and alert, we will resume her home medication.   5.  We will provide her gastrointestinal prophylaxis and deep vein thrombosis prophylaxis with heparin subQ.  No family members are available at bedside.  6.  CODE STATUS needs to  be determined in the a.m. by the rounding physician.   Total critical care time spent is 50 minutes.  Situation was critical.  We will transfer the patient to Dr. Eustaquio Boydenejan-Sie's service in a.m.    ____________________________ Ramonita LabAruna Phong Isenberg, MD ag:ea D: 09/24/2013 05:15:09 ET T: 09/24/2013 06:14:55 ET JOB#: [id number]  cc: Ramonita LabAruna Maelyn Berrey, MD, <Dictator> Sheikh A. Ellsworth Lennoxejan-Sie, MD Margaretann LovelessNeelam S. Khan, MD Ramonita LabARUNA Janalynn Eder MD ELECTRONICALLY SIGNED 09/24/2013 7:32

## 2015-02-12 NOTE — Op Note (Signed)
PATIENT NAME:  [name] [name], Aradhana MR#:  [id number] DATE OF BIRTH:  1956-01-09  DATE OF PROCEDURE:  08/07/2013  PREOPERATIVE DIAGNOSES:   1.  Below-knee amputation stump infection with need for extended intravenous antibiotics.  2.  Peripheral arterial disease.   POSTOPERATIVE DIAGNOSES:   1.  Below-knee amputation stump infection with need for extended intravenous antibiotics.  2.  Peripheral arterial disease.   PROCEDURES:  1. Ultrasound guidance for vascular access to right basilic vein.  2. Fluoroscopic guidance for placement of catheter.  3. Insertion of peripherally inserted central venous catheter, dual lumen, right arm.  SURGEON: Annice NeedyJason S. Dew, MD.  ANESTHESIA: Local.   ESTIMATED BLOOD LOSS: Minimal.   INDICATION FOR PROCEDURE: This is a female who has severe peripheral vascular disease  status post amputation on the right leg and has an infection that needs to be treated with extended IV antibiotics. PICC line is requested.   DESCRIPTION OF PROCEDURE: The patient's right arm was sterilely prepped and draped, and a sterile surgical field was created. The right basilic vein was accessed under direct ultrasound guidance without difficulty with a micropuncture needle and permanent image was recorded. 0.018 wire was then placed into the superior vena cava. Peel-away sheath was placed over the wire. A single lumen peripherally inserted central venous catheter was then placed over the wire and the wire and peel-away sheath were removed. The catheter tip was placed into the superior vena cava and was secured at the skin at 36 cm with a sterile dressing. The catheter withdrew blood well and flushed easily with heparinized saline. The patient tolerated procedure well.   ____________________________ Annice NeedyJason S. Dew, MD jsd:np D: 08/07/2013 14:43:58 ET T: 08/07/2013 18:50:12 ET JOB#: [id number]  cc: Annice NeedyJason S. Dew, MD, <Dictator> Annice NeedyJASON S DEW MD ELECTRONICALLY SIGNED 08/13/2013 11:50

## 2015-02-12 NOTE — Consult Note (Signed)
CHIEF COMPLAINT and HISTORY:  Subjective/Chief Complaint right leg pain   History of Present Illness Patient with a history of severe PAD.  S/P right fem pop earlier this year for rest pain.  Presents with one day history of right leg pain.  Her INR was subtherapeutic.  She has severe pain at rest and dangling the leg helps a little.  No palpable pulse in right foot.  Suspect occlusion of the bypass.  Will admit for angio and possible lysis.   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:    Arthritis:    HTN:    Diabetes Mellitus, Type II (NIDD):    stent in right leg:    Angioplasty:    Lumbar Discectomy:    Splenectomy:   ALLERGIES:  Allergies:  No Known Allergies:   HOME MEDICATIONS:  Home Medications: Medication Instructions Status  amitriptyline 50 mg oral tablet 1 tab(s) orally once a day (at bedtime) Active  allopurinol 100 mg oral tablet 1 tab(s) orally once a day Active  Lyrica 300 mg oral capsule 1 cap(s) orally 2 times a day Active  Onglyza 2.5 mg oral tablet 1 tab(s) orally once a day Active  DULoxetine 60 mg oral delayed release capsule 1 cap(s) orally once a day Active  warfarin 4 mg oral tablet 1 tab(s) orally once a day Active  oxyCODONE 5 mg oral tablet 1 tab(s) orally 2 times a day Active  Edarbyclor 40 mg-12.5 mg oral tablet 1 tab(s) orally once a day Active  Integra Plus Vitamin B Complex with C, Folic Acid and Iron oral capsule 1 cap(s) orally once a day Active  Crestor 20 mg oral tablet 1 tab(s) orally once a day (in the morning) Active  Calcium 600+D 600 mg-200 units oral tablet 1 tab(s) orally once a day Active   Family and Social History:  Family History Hypertension  Diabetes Mellitus   Social History positive  tobacco (Current within 1 year), negative ETOH   Place of Living Home   Review of Systems:  Subjective/Chief Complaint right leg pain    Fever/Chills No   Cough No   Sputum No   Abdominal Pain No   Diarrhea No   Constipation No   Nausea/Vomiting No   SOB/DOE No   Chest Pain No   Telemetry Reviewed NSR   Dysuria No   Tolerating PT No   Tolerating Diet Yes   Medications/Allergies Reviewed Medications/Allergies reviewed   Physical Exam:  GEN well developed, well nourished   HEENT hearing intact to voice, moist oral mucosa   NECK No masses  trachea midline   RESP normal resp effort  no use of accessory muscles   CARD regular rate  no JVD   VASCULAR ACCESS none   ABD denies tenderness  normal BS   GU no superpubic tenderness   LYMPH negative neck, negative axillae   EXTR right leg without distal pulses, poor cap refill.  Neuro status is intact.  left foot warm with good capillary refill   SKIN normal to palpation, skin turgor good   NEURO cranial nerves intact, motor/sensory function intact   PSYCH alert, A+O to time, place, person   LABS:  Laboratory Results: Hepatic:    05-Aug-14 06:22, Comprehensive Metabolic Panel  Bilirubin, Total 0.3  Alkaline Phosphatase 66  SGPT (ALT) 77  SGOT (AST) 79  Total Protein, Serum 6.2  Albumin, Serum 3.0  Routine Chem:  Glucose, Serum 110  BUN 21  Creatinine (comp) 1.47  Sodium, Serum 142  Potassium, Serum 3.8  Chloride, Serum 109  CO2, Serum 31  Calcium (Total), Serum 9.1  Osmolality (calc) 287  eGFR (African American) 45  eGFR (Non-African American) 39  eGFR values <91m/min/1.73 m2 may be an indication of chronic  kidney disease (CKD).  Calculated eGFR is useful in patients with stable renal function.  The eGFR calculation will not be reliable in acutely ill patients  when serum creatinine is changing rapidly. It is not useful in   patients on dialysis. The eGFR calculation may not be applicable  to patients at the low and high extremes of body sizes, pregnant  women, and vegetarians.  Anion Gap 2  Routine Coag:    05-Aug-14 06:22,  Activated PTT  Activated PTT (APTT) 33.2  A HCT value >55% may artifactually increase the APTT. In one study,  the increase was an average of 19%.  Reference: "Effect on Routine and Special Coagulation Testing Values  of Citrate Anticoagulant Adjustment in Patients with High HCT Values."  American Journal of Clinical Pathology 2006;126:400-405.    05-Aug-14 06:22, Prothrombin Time  Prothrombin 15.9  INR 1.3  INR reference interval applies to patients on anticoagulant therapy.  A single INR therapeutic range for coumarins is not optimal for all  indications; however, the suggested range for most indications is  2.0 - 3.0.  Exceptions to the INR Reference Range may include: Prosthetic heart  valves, acute myocardial infarction, prevention of myocardial  infarction, and combinations of aspirin and anticoagulant. The need  for a higher or lower target INR must be assessed individually.  Reference: The Pharmacology and Management of the Vitamin K   antagonists: the seventh ACCP Conference on Antithrombotic and  Thrombolytic Therapy. [id number].9798Sept:126 (3suppl): [id number]  A HCT value >55% may artifactually increase the PT.  In one study,   the increase was an average of 25%.  Reference:  "Effect on Routine and Special Coagulation Testing Values  of Citrate Anticoagulant Adjustment in Patients with High HCT Values."  American Journal of Clinical Pathology 2006;126:400-405.  Routine Hem:    05-Aug-14 06:22, Hemogram, Platelet Count  WBC (CBC) 9.2  RBC (CBC) 4.62  Hemoglobin (CBC) 12.9  Hematocrit (CBC) 39.1  Platelet Count (CBC) 263  Result(s) reported on 27 May 2013 at 06:49AM.  MCV 85  MCH 27.9  MCHC 33.0  RDW 20.3   RADIOLOGY:  Radiology Results: XRay:    11-Apr-14 20:49, Knee Right Complete  Knee Right Complete  REASON FOR EXAM:    knee pain, fall  COMMENTS:       PROCEDURE: DXR - DXR KNEE RT COMP WITH OBLIQUES  - Jan 31 2013  8:49PM     RESULT: Comparison:  None.    Findings:  No acute fracture. Joint spaces are maintained. There is a small knee   effusion. A vascular stent is present. Curvilinear metallic density is   seen posterior to the proximal tibia.    IMPRESSION:   No acute fracture.    Verified By: RGregor Hams M.D., MD  [place]    08-Jan-14 15:20, [place] Doppler Lower Extrem Right  [place] Doppler Lower Extrem Right  REASON FOR EXAM:    leg pain and cool x 1 week   room 17  COMMENTS:       PROCEDURE: [place] - [place] LOW EXTR RIGHT  - Oct 30 2012  3:20PM  RESULT: Comparison: None    Findings: Multiple longitudinal and transverse gray-scale as well as   color and spectral Doppler images of the right lower extremity veins were   obtained from the common femoral veins through the popliteal veins.    The right common femoral, greater saphenous, femoral, popliteal veins,   and venous trifurcation are patent, demonstrating normal color-flow and   compressibility. No intraluminal thrombus is identified.There is normal   respiratory variation and augmentation demonstrated at all vein levels.   Incidentally noted is a femoral artery stent without color-flow   consistent with thrombosis.    IMPRESSION:      1. No evidence of DVT in the right lower extremity.    2. Although a right femoral artery stent is identified, evaluation of the   arterial system is limited. There is thrombosis of the femoral artery   stent. If there is further clinical concern recommend a CTA of the lower   extremity. Recommend vascular surgery consultation.    Dictation Site: 1      Verified By: Jennette Banker, M.D., MD    04-Mar-14 21:18, [place] Color Flow Doppler Lower Extrem Right (Leg)  [place] Color Flow Doppler Lower Extrem Right (Leg)  REASON FOR EXAM:    pain in leg, poor circulation with hx SFA stent  COMMENTS:       PROCEDURE: [place]  - US DOPPLER LOW EXTR RIGHT  - Dec 24 2012  9:18PM     RESULT: Doppler interrogation of the deep  venous system of the right leg   is performed from the common femoral vein through the popliteal vein. The   deep venous structures show full compressibility with grayscale   compression imaging with normal color and spectral Doppler appearance   throughout the deep venous system investigated. The stentin the femoral   artery is partially visualized incidentally and shows no definite   evidence of flow in the right common femoral artery through the distal   femoral artery.    IMPRESSION:   1. No evidence of right lower extremity deep vein thrombosis.  2. Findings consistent with thrombus in the right femoral arterial stent.    Dictation Site: 6        Verified By: Sundra Aland, M.D., MD    29-Mar-14 18:19, US Soft Tissue -Other Than Head or Neck  US Soft Tissue -Other Than Head or Neck  REASON FOR EXAM:    abscess r/o pseudo aneurysm  COMMENTS:       PROCEDURE: [place]  - US SOFT TISSUE, NOT NECK /  HEAD  - Jan 18 2013  6:19PM     RESULT: Focused ultrasound dated 01/18/2013.    Findings: Within the region of concern approximately 7 to 8 mm deep to   the skin an irregularly bordered hypoechoic focus is identified with   internal echoes and peripheral vascularity. This area measures 1.3 x .98   x 1.52 cm. When correlated with the patient's reported history of recent   iatrogenic intervention this finding may represent postsurgical change   though a phlegmonous collection is a diagnostic consideration. A definite   definitive drainable abscess is not clearly appreciated.  IMPRESSION:  Heterogeneous irregular bordered collection projecting in   the region of the surgical bed concerning for a phlegmonous collection   considering patient's history. Clinical correlation recommended and   possibly surgical consultation.        Verified By: Mikki Santee, M.D., MD  28-Apr-14 10:43, [place] Color Flow Dopper Upper Extrem Right (Arm)  [place] Color Flow Dopper Upper Extrem Right (Arm)   REASON FOR EXAM:    R arm pain and inflammation  Eval for DVT  COMMENTS:       PROCEDURE: [place]  - US DOPPLER UP EXTR RIGHT  - Feb 17 2013 10:43AM     RESULT: Grayscale and color Doppler techniques were employed to evaluate   the deep veins of the rightupper extremity.    The vessels from the antecubital fossa to the jugular vein are normally   compressible where accessible. The waveform patterns and color flow   images are normal. The response to the augmentation maneuver is normal.    IMPRESSION: There is no evidence of thrombus within the visualized deep   veins of the right upper extremity.   Dictation Site: 2        Verified By: DAVID A. Martin[place], M.D., MD    05-Aug-14 01:20, [place] Color Flow Doppler Lower Extrem Right (Leg)  [place] Color Flow Doppler Lower Extrem Right (Leg)  REASON FOR EXAM:    right lower leg pain; hx bypass in March  COMMENTS:       PROCEDURE: [place]  - US DOPPLER LOW EXTR RIGHT  - May 27 2013  1:20AM     RESULT: Comparison: None    Findings: Multiple longitudinal and transverse gray-scale as well as   color and spectral Doppler images of the right lower extremity veins were   obtained from the common femoral veins through the popliteal veins.    The right common femoral, greater saphenous, femoral, popliteal veins,   and venous trifurcation are patent, demonstrating normal color-flow and   compressibility. No intraluminal thrombus is identified.There is normal   respiratory variation and augmentation demonstrated at all vein levels.  IMPRESSION:  No evidence of DVT in the right lower extremity.    Dictation Site: 1        Verified By: Jennette Banker, M.D., MD  LabUnknown:    18-Dec-13 13:59, MRI Lumbar Spine Without Contrast  PACS Image    08-Jan-14 15:20, [place] Color Flow Doppler Lower Extrem Right  PACS Image    04-Mar-14 21:18, [place] Color Flow Doppler Lower Extrem Right (Leg)  PACS Image    06-Mar-14 10:37, NM MYOCARDIAL SCAN  PACS Image    29-Mar-14  18:19, US Soft Tissue -Other Than Head or Neck  PACS Image    11-Apr-14 20:49, Knee Right Complete  PACS Image    28-Apr-14 10:43, [place] Color Flow Dopper Upper Extrem Right (Arm)  PACS Image    14-Jun-14 11:59, CT Abdomen and Pelvis Without Contrast  PACS Image    05-Aug-14 01:20, [place] Color Flow Doppler Lower Extrem Right (Leg)  PACS Image  MRI:    29-Oct-13 12:29, MRI Brain* and IAC WWO  MRI Brain* and IAC WWO  REASON FOR EXAM:    asymmetrical tinnitis otalgia  COMMENTS:       PROCEDURE: MR  - MR BRAIN W/WO INC IAC W/WO ROUTI  - Aug 20 2012 12:29PM     RESULT: History: Tinnitus.        Comparison Study: No recent.    Findings: Multiplanar, multisequence imaging of the brain is obtained. 50   cc of multi-Hance administered. Posterior fossa including VIII nerve   complexes are normal. Mastoids are clear. Diffusion-weighted images are   normal. Paranasal sinuses and orbits are normal. Prominent subcortical    and deep white matter  changes are noted. Difference diagnosis includes   chronic small ischemia, MS, vasculitis. Subtle white matter signal   abnormalities also noted within the brainstem.    IMPRESSION:    1. Multiple periventricular and subcortical white matter changes. Similar   findings noted within the brainstem. Differential diagnosis includes   chronic small vessel ischemia, MS, vasculitis.  2. No other abnormalities identified. No enhancing abnormalities; the   posterior fossa including VIII nerve complexes are normal. No evidence of   acute stroke.          Verified By: Osa Craver, M.D., MD    18-Dec-13 13:59, MRI Lumbar Spine Without Contrast  MRI Lumbar Spine Without Contrast  REASON FOR EXAM:    low back pain DDD  COMMENTS:       PROCEDURE: MR  - MR LUMBAR SPINE WO CONTRAST  - Oct 09 2012  1:59PM     RESULT:     Technique: Multiplanar and multisequence imaging of the lumbar spine was   obtained without the administration of  gadolinium.    Findings: The conus medullaris terminates at an L1-L2 level. The cauda   equina demonstrate no evidence of clumping nor thickening.    At T12-L1, L1-L2, L2-L3, L3-L4 levels, there is no evidence of thecal sac   stenosis nor neural foraminal narrowing.  At the L4-L5 level multifactorial mild thecal sac stenosis is appreciated   secondary to a mild broad-based disc bulge as well as ligamentum flavum   and facet hypertrophy. There is no evidence of significant neural   foraminal narrowing.    At the L5-S1 level a broad-based disc bulge is appreciated resulting in   mild effacement of the anterior CSF space. There is encroachment upon the   central aspect of the S1 nerve root on the left and to a lesser extent   the right. There is mild bilateral neural foraminal narrowing with   possible exiting nerve root compromise. The osseous structures   demonstrate no evidence of marrow edema. There is no evidence of linear   signal void reflecting fracture.    Postsurgical changes areidentified within the posterior soft tissues of   the lower back extending into the upper sacral region.  IMPRESSION:  Focal disc protrusion at L5-S1 with encroachment upon the   central aspect of the S1 nerve roots bilaterally. Exiting nerve root   compromise bilaterally left greater than right cannot be excluded.    Thank you for the opportunity to contribute to the care of your patient.         Verified By: Mikki Santee, M.D., MD  CT:    14-Jun-14 11:59, CT Abdomen and Pelvis Without Contrast  CT Abdomen and Pelvis Without Contrast  REASON FOR EXAM:    (1) anemia on anticoagulation &hemoccult negative;   (2) anemia on anticoagulation  COMMENTS:       PROCEDURE: CT  - CT ABDOMEN AND PELVIS W0  - Apr 05 2013 11:59AM     RESULT: History coma anemia.    Comparison Study: No prior.    Findings: Standard nonenhanced CT obtained. Liver normal. Gallbladder and   biliary system nondistended.  No focal pancreatic abnormality. Prior   splenectomy. Splenosis. Adrenals normal. Kidneys normal. No   hydronephrosis or obstructing ureteral stone. Bladder nondistended. Small   cystocele noted. Abdominal aorta normal caliber. Right femoral stent     graft noted. Adjacent right groin soft tissue thickening is noted most   consistent scarring. Cellulitis in this  region cannot be excluded,   Mild   prominence and irregularity of the uterus is noted most consistent with   fibroid uterus. Adnexa unremarkable. No evidence of bowel obstruction.   Large amount stool noted in the colon. Gastric and perigastric regions   normal. Appendix normal. Atelectasis and/or infiltrates in the lung bases   particularly the left. No free air. No acute bony abnormality. Mild edema   noted in the subcutaneous case tissues of the back most likely dependent.   No evidence of intra- abdominal significant hemorrhage. Nonspecific tiny   amount of free pelvic fluid.    IMPRESSION:  Left base atelectasis and/or infiltrate. Prior splenectomy.    Soft tissue density the left upper abdomen is most likely secondary to   splenosis.    Verified By: Osa Craver, M.D., MD  [facility]:    15-Oct-13 14:41, Screening Digital Mammogram  Screening Digital Mammogram  REASON FOR EXAM:    SCR MAMMO NO ORDER  COMMENTS:       PROCEDURE: MAM - MAM DGTL SCRN MAM NO ORDER W/CAD  - Aug 06 2012  2:41PM     RESULT: COMPARISON:  08/01/2011, 07/28/2009, 07/26/2006    TECHNIQUE: Digital screening mammograms were obtained. FDA approved   computer-aided detection (CAD) for mammography was utilized for this   study.    FINDING:      Bilateral breasts demonstrate a scattered fibroglandular density. There   is no dominant mass, architectural distortion or clusters of suspicious     microcalcifications.    IMPRESSION:     1.     Stable bilateral mammogram.  2.     Annual mammographic follow up recommended.  3.     BI-RADS:   Category 2- Benign.    A negative mammogram report does not preclude biopsy or other evaluation   of a clinically palpable or otherwise suspicious mass or lesion. Breast   cancer may not be detected by mammography in up to 10% of cases.    Thank you for the opportunity to contribute to the care of your patient.    Dictation site: 1    Verified By: HETALP. PATEL, M.D., MD   ASSESSMENT AND PLAN:  Assessment/Admission Diagnosis ischemic right LE. suspected right femoral - popliteal bypass occlusion DM HTN   Plan No palpable pulse in right foot.  Suspect occlusion of the bypass.  Will admit for angio and possible lysis.  Risk of limb loss is quite high both on this admission and long term.   level 5   Electronic Signatures: Algernon Huxley (MD)  (Signed 05-Aug-14 09:01)  Authored: Chief Complaint and History, PAST MEDICAL/SURGICAL HISTORY, ALLERGIES, HOME MEDICATIONS, Family and Social History, Review of Systems, Physical Exam, LABS, RADIOLOGY, Assessment and Plan   Last Updated: 05-Aug-14 09:01 by Algernon Huxley (MD)

## 2015-02-12 NOTE — Op Note (Signed)
PATIENT NAME:  [name] [name], [name] [name] MR#:  [id number] DATE OF BIRTH:  May 13, 1956  DATE OF PROCEDURE:  08/08/2013  PREOPERATIVE DIAGNOSIS:  Abscess, right below-knee amputation stump.   POSTOPERATIVE DIAGNOSIS:  Abscess, right below-knee amputation stump.  PROCEDURES PERFORMED: 1.  Excisional debridement of right below-knee amputation stump with partial closure.  2.  Application of wound VAC dressing.   PROCEDURE PERFORMED BY: Renford DillsGregory G. Champagne Paletta, M.D.   ANESTHESIA: General by LMA.   FLUIDS: Per anesthesia record.   ESTIMATED BLOOD LOSS: Minimal.   SPECIMEN:  None.  INDICATIONS: [name] [name] is a 59 year old woman who has undergone amputation, which was complicated with abscess formation within the stump. She is now being returned to the OR for debridement and partial closure. Risks and benefits were reviewed with the patient preoperatively. All questions were answered. The patient has agreed to proceed.   DESCRIPTION OF PROCEDURE: The patient is taken to the operating room and placed in the supine position. After adequate general anesthesia is induced, the right leg is prepped and draped in a sterile fashion. Appropriate timeout is called.   The wound is then irrigated with sterile saline, and using a curved Mayo scissors, small amounts of necrotic tendon are debrided from the posterior flap. The wound is then inspected for hemostasis, irrigated 1 more time, and the posterior flap is then approximated to the anterior skin margin along the medial half of the wound using interrupted 2-0 nylon vertical mattress sutures. A total of 3 sutures are used.  A VAC sponge was then placed in the open portion of the wound and Mepitel and a VAC sponge are placed across the suture line medially.  VAC dressing is then applied with good seal.   At the conclusion of the partial closure, the lateral aspect is now open. It measures 5 cm x 7 cm x 2 cm in depth. It appears to be well granulating with healthy viable  tissue.   The patient was taken to the recovery room in stable condition.     ____________________________ Renford DillsGregory G. Anedra Penafiel, MD ggs:dmm D: 08/08/2013 09:26:00 ET T: 08/08/2013 10:20:12 ET JOB#: [id number]  cc: Renford DillsGregory G. Tiawanna Luchsinger, MD, <Dictator> Silas FloodSheikh A. Ellsworth Lennoxejan-Sie, MD Renford DillsGREGORY G Arlette Schaad MD ELECTRONICALLY SIGNED 08/29/2013 8:14

## 2015-02-12 NOTE — Discharge Summary (Signed)
PATIENT NAME:  [name] [name], [name] [name] MR#:  [id number] DATE OF BIRTH:  12/10/1955  DATE OF ADMISSION:  09/15/2013 DATE OF DISCHARGE:  09/17/2013  ADMISSION DIAGNOSES:  1.  Nonhealing right below-knee amputation stump infection.  2.  Peripheral arterial disease.  3.  Diabetes.  4.  Tobacco dependence.   DISCHARGE DIAGNOSES: 1.  Nonhealing right below-knee amputation stump infection.  2.  Peripheral arterial disease.  3.  Diabetes.  4.  Tobacco dependence.   PROCEDURE PERFORMED WHILE IN THE HOSPITAL: Right above-knee amputation on November 24. For full details of that, please see that dictated operative summary.   BRIEF HISTORY OF PRESENT ILLNESS: A 59 year old [place] with multiple medical comorbidities and a long history of peripheral vascular disease. She has undergone a right below-knee amputation at an outside institution. She had been treated for infection with multiple debridements of this wound. However, she has developed recurrent drainage from the wound and developed a widely exposed tibia that no longer had a below-knee amputation salvageable. For this reason, she is being converted to above-knee amputation.   HOSPITAL COURSE:  The patient was admitted to same day surgery and taken to the operating room where a right below-knee amputation was performed. For full details of that, please see that dictated operative summary. She did well. Her pain was controlled initially with IV pain medication and transitioned over to oral pain medications. By post procedure day #2 , she was ready for discharge to a rehab facility.   DIET:  Regular.   ACTIVITY: As tolerated.   MEDICATIONS:  Will include oxycodone 50 mg every 6 hours as needed, Tylox 1 every 4 hours as needed, polyethylene glycol daily, hydrochlorothiazide 25 mg daily, Colace 100 mg daily, Protonix 40 mg b.i.d., calcium and vitamin D b.i.d., methocarbamol 500 mg q.i.d., Crestor 20 mg daily, Edarbyclor 40/12.5 daily,  duloxetine 60 mg daily, Onglyza 2.5 mg daily, Lyrica 300 mg b.i.d., allopurinol 100 mg daily, amitriptyline 50 mg at bedtime.   FOLLOWUP:  She will return to the office next week for a wound evaluation and follow-up. She will call or contact our office with any problems in the interim.  ____________________________ Annice NeedyJason S. Traeger Sultana, MD jsd:dp D: 09/17/2013 16:33:00 ET T: 09/17/2013 16:51:13 ET JOB#: [id number]  cc: Annice NeedyJason S. Aradhya Shellenbarger, MD, <Dictator> Annice NeedyJASON S Tomica Arseneault MD ELECTRONICALLY SIGNED 09/29/2013 9:23

## 2015-02-12 NOTE — Discharge Summary (Signed)
PATIENT NAME:  [name] [name], [name] [name] MR#:  [id number] DATE OF BIRTH:  1955/12/04  DATE OF ADMISSION:  10/30/2012 DATE OF DISCHARGE:  11/02/2012  DISCHARGE DIAGNOSES:  1. Ischemia of right lower extremity.  2. Atherosclerotic occlusive disease, bilateral lower extremities, with rest pain of right lower extremity.  3. Complication vascular device with thrombosis and restenosis of intravascular stent, right leg.   SECONDARY DIAGNOSES: 1. Diabetes. 2. Neuropathy. 3. Hypertension.  4. Hypercholesterolemia.  5. Tobacco abuse. 6. Osteoporosis.   CONSULTATIONS: [facility], medical management.   PROCEDURES:  1. Angiography with initiation of lytic therapy 10/31/2012.  2. Angiography with intervention, right lower extremity, 11/01/2012.   HOSPITAL COURSE: The patient is a 59 year old woman with a history of ischemia of the right lower leg in the past. She has had several interventions for attempted limb salvage. She presented to the Emergency Room late evening of January 8th with coldness of her foot and pain in her right leg which had been ongoing for approximately 6 or 7 days. Physical examination as well as noninvasive studies supported a thrombosis and/or occlusion of the superficial femoral artery stent. She was, therefore, admitted.   The patient was initially placed on heparin. Subsequently, she was taken to the angio suite where the occlusion was crossed and TPA infusion was initiated. A central line was placed, and she was transferred to the unit. She was returned to the angio suite after approximately 6 to 8 hours and found to have significant improvement but still a large residual burden of thrombus and was continued at a lower rate of TPA infusion over the next 12 hours. On the morning of January 10th, she was again returned to the angio suite where multiple areas of greater than 80% stenosis were identified throughout the stented SFA. Angioplasty to 6 mm was performed which yielded an  inadequate result and, therefore, Viabahn stents were placed from the level of the above-knee popliteal to the origin of the SFA essentially covering the previously placed stents. These were post dilated, and the follow-up angiography demonstrated an excellent result with a smooth lumen and rapid flow of contrast through the SFA, popliteal. She had single-vessel runoff via the posterior tibial which was preserved and a palpable posterior tibial pulse at the conclusion of the procedure. She was returned to the intensive care unit overnight, and today she has done well. There have been no post procedural complications, and she is fit for discharge. She is discharged to home.   DISCHARGE INSTRUCTIONS: She will follow up in 1 to 2 weeks. She will continue all of her home medicines as they were prior to admission. She is given Percocet 7.5s for her chronic pain control in association with this new insult. Diet is diabetic ADA. Activities are light activities until seen in the office.   ____________________________ Renford DillsGregory G. Dionisia Pacholski, MD ggs:cb D: 11/02/2012 12:32:51 ET T: 11/03/2012 13:32:54 ET JOB#: [id number]  cc: Renford DillsGregory G. Aryanna Shaver, MD, <Dictator> Margaretann LovelessNeelam S. Khan, MD Renford DillsGREGORY G Gerlean Cid MD ELECTRONICALLY SIGNED 11/15/2012 9:59

## 2015-02-14 NOTE — Op Note (Signed)
PATIENT NAME:  [name] [name], [name] [name] MR#:  [id number] DATE OF BIRTH:  April 10, 1956  DATE OF PROCEDURE:  04/23/2012  PREOPERATIVE DIAGNOSIS: Atherosclerotic occlusive disease bilateral lower extremities with claudication, right lower extremity.   POSTOPERATIVE DIAGNOSIS: Atherosclerotic occlusive disease bilateral lower extremities with claudication, right lower extremity.  PROCEDURES PERFORMED:  1. Abdominal aortogram.  2. Right lower extremity distal runoff, third order catheter placement.  3. Crosser atherectomy, right SFA.  4. Percutaneous transluminal angioplasty and stent placement, right SFA.  5. Closure device left common femoral artery with StarClose.   SURGEON: Renford DillsGregory G. Schnier, MD  SEDATION: Versed 4 mg plus fentanyl 150 mcg administered IV. Continuous ECG, pulse oximetry and cardiopulmonary monitoring is performed throughout the entire procedure by the interventional radiology nurse. Total sedation time is 1 hour, 15 minutes.   ACCESS: 6 French sheath, left common femoral artery.   CONTRAST USED: Isovue 90 mL.   FLUOROSCOPY TIME: 12.9 minutes.   INDICATIONS: Mr. [name] [name] is a 59 year old gentleman who presents to the office with complaints of increasing pain in his right lower extremity. He describes inability to carry on his daily activities secondary to the pain. He has undergone intervention on the left lower extremity in the past with excellent result and is now anxious to proceed with revascularization of the right. Risks and benefits were reviewed. All questions answered. Patient agrees to proceed.   DESCRIPTION OF PROCEDURE: Patient is taken to the special procedures suite, placed in supine position. After adequate sedation is achieved, both groins are prepped and draped in a sterile fashion. 1% lidocaine is infiltrated in the soft tissues overlying the left common femoral impulse and access to the left common femoral artery is obtained with a micropuncture needle, microwire  followed by micro sheath, J-wire followed by 5 French sheath and 5 French pigtail catheter. Pigtail catheter is positioned at the level of T12 and AP projection of the aorta is obtained. Pigtail catheter is repositioned to above the bifurcation and LAO view of the pelvis is obtained. A stiff angled Glidewire and pigtail catheter are then used to cross the aortic bifurcation and the RAO projection of the right groin is obtained through the pigtail catheter. Wire and pigtail catheter are negotiated into the SFA and distal runoff is obtained. Stiff angled Glidewire is reintroduced. 4000 units of heparin is given and a 6 [place] Ansel sheath is advanced up and over the aortic bifurcation positioned with the tip in the common femoral and exchanged for the 5 French sheath. The dilator is removed. A 125 straight slip catheter is advanced over the wire and hand injection of contrast and magnified view is used to image the SFA occlusion and reconstitution. 0.014 grand slam wire is then introduced. Straight catheter is removed and an angled Usher catheter is advanced over the wire and positioned in the SFA. S6 crosser atherectomy device is prepped on the field and then advanced through the Usher and used to negotiate the occlusion with the S6 and Usher device advanced in tandem down to the above-knee popliteal. The S6 is removed. Hand injection of contrast demonstrates there is now luminal filling, however, there is a small flap. Nordstromrand slam wire is reintroduced. Usher catheter is then exchanged for a straight slip catheter and the angled glide and straight slip catheter are used to negotiate this flap. Catheter and wire combination are then advanced down to the mid distal popliteal where hand injection of contrast demonstrates complete intraluminal positioning and distal runoff is obtained. 260 Magic torque wire is  introduced and a 5 x 20 Dorado balloon is used to angioplasty the SFA from the above-knee popliteal to the common  femoral. Following two serial inflations to 12 atmospheres for approximately one minute, several areas of flow-limiting dissection are noted. The most severe is at the re-entry site in Hunter's canal and a 6 x 170 LifeStent is deployed across this area extending more proximally. It is post dilated with a 6 x 15 Rival balloon to 12 atmospheres. The Rival balloon is then used to angioplasty the more proximal SFA and the origin of the SFA. Follow-up angiography demonstrates a persistent dissection which appears to be flow limiting at the origin and a 6 x 40 LifeStent is deployed across this area and postdilated with a 6 x 15 Rival. Follow-up angiography now demonstrates rapid flow of contrast through the SFA, popliteal and distally down to the foot with in-line flow.   The sheath is then pulled into the external iliac on the left, oblique view is obtained, and a StarClose device deployed without difficulty. There are no immediate complications.   INTERPRETATION: The abdominal aortogram demonstrates single renal arteries. No evidence of renal artery stenosis. There is diffuse atherosclerotic disease but no evidence of a hemodynamically significant plaque or shelf within the aorta. Aortic bifurcation is widely patent. Bilateral common iliacs are widely patent as are the external iliacs. Previously placed left external iliac stent is widely patent.   The right common femoral and profunda femoris are patent. The superficial femoral artery demonstrates a string sign at its origin with a subtotal occlusion somewhat more distally. There is then a small segment of reconstitution and then occlusion at about Hunter's canal. There is reconstitution of the above-knee popliteal. The popliteal is patent down to the trifurcation. Posterior tibial artery is patent throughout its course. Peroneal artery is quite small in its origin and appears to occlude after several centimeters. Anterior tibial artery is again patent at it is  origin but is quite small and occludes within its proximal to mid one-third. As noted posterior tibial is the dominant runoff to the foot filling the pedal arch.   After crosser atherectomy intraluminal placement is verified.   After angioplasty there is flow-limiting dissections and these are treated with two stents as described above. The entire SFA is post dilated to 6 mm with an excellent result and maintenance of the single vessel runoff to the foot.   SUMMARY: Successful revascularization of the right lower extremity.    ____________________________ Renford Dills, MD ggs:cms D: 04/23/2012 10:52:56 ET T: 04/23/2012 13:10:37 ET JOB#: [id number]  cc: Renford Dills, MD, <Dictator> Margaretann Loveless, MD  Renford Dills MD ELECTRONICALLY SIGNED 04/30/2012 12:23

## 2015-02-14 NOTE — Op Note (Signed)
PATIENT NAME:  [name] [name], [name] [name] MR#:  [id number] DATE OF BIRTH:  09/27/1956  DATE OF PROCEDURE:  04/24/2012  PREOPERATIVE DIAGNOSES:  1. Atherosclerotic occlusive disease bilateral lower extremities with rest pain of the right lower extremity.  2. Complication of vascular device with thrombosis of femoral stent.   POSTOPERATIVE DIAGNOSES: 1. Atherosclerotic occlusive disease bilateral lower extremities with rest pain of the right lower extremity.  2. Complication of vascular device with thrombosis of femoral stent.   PROCEDURES PERFORMED: 1. Right lower extremity distal runoff, third order catheter placement.  2. Infusion of TPA for thrombolysis.  3. Mechanical thrombectomy with AngioJet thrombectomy catheter.  4. Percutaneous transluminal angioplasty and stent placement of the right superficial femoral artery with the addition of two stents.   PROCEDURE PERFORMED BY: Renford Dills, MD   SEDATION: Versed 4 mg plus fentanyl 150 mcg administered IV. Continuous ECG, pulse oximetry, and cardiopulmonary monitoring was performed throughout the entire procedure by the interventional radiology nurse. Total sedation time was one hour.   ACCESS: 6 French sheath, left common femoral artery.   CONTRAST USED: Isovue 60 mL.   FLUORO TIME: 13.4 minutes.   INDICATIONS: [name] is a 59 year old woman who presented to the office the day following her original procedure with the complaints of increasing pain in her right lower extremity. Physical examination as well as noninvasive studies demonstrated thrombosis of the superficial femoral artery. She is, therefore, undergoing treatment for salvage of her right limb. Risks and benefits were reviewed. All questions answered. The patient agrees to proceed.   PROCEDURE: The patient is taken to Special Procedures and placed in the supine position. After adequate sedation is achieved, the left groin is prepped and draped in a sterile fashion. Ultrasound is  placed in a sterile sleeve. Ultrasound is utilized secondary to lack of appropriate landmarks and to avoid vascular injury. Under direct visualization, the common femoral artery is noted. It is echolucent and pulsatile indicating patency. Image is recorded for the permanent record and with real-time visualization after 1% lidocaine is infiltrated in the soft tissues, micropuncture needle is used to access the anterior wall of the common femoral artery, microwire followed by micro sheath, rim catheter, stiff angled Glidewire is then advanced up and over the bifurcation. 5 French sheath is then exchanged for a 6 [place] Ansel sheath. Hand injection of contrast is then utilized to demonstrate the external iliac artery, common femoral and profunda femoris. Superficial femoral artery is confirmed to be thrombosed.   Using a straight slip catheter and an angled Glidewire, the superficial femoral artery is crossed and the catheter is advanced down into the mid popliteal. Distal runoff is then obtained by hand injection demonstrating preservation of patency of the distal popliteal and the tibial vessel.   The AngioJet Proxy catheter is then opened and prepped on the field. 6 mg of TPA is reconstituted in 50 mL and the thrombosed SFA segment is laced with 6 mg of TPA. This is allowed to dwell for approximately 20 minutes. Following this, the Proxy catheter is then used to mechanically remove residual thrombus. After multiple passes are performed, follow-up angiography from the sheath demonstrates that thrombus within the SFA is resolved in its entirety and distal runoff has been preserved. On review of these images, there appears to be significant irregularity in a segment of the SFA between the two previously placed stents. This is an unstented segment and, therefore, a 7 x 150 LifeStent is advanced across this area, deployed without difficulty, and  postdilated to 6 mm. There is also an area of irregularity at the origin  of the SFA in the previously stented segment and this does not respond or change after balloon angioplasty of 6 mm and, therefore, a second 7 x 40 stent is placed in this region and postdilated to 7 mm. This now yields resolution of this irregularity. There is now again rapid flow of contrast from the femoral injection through the SFA into the distal popliteal and tibials.   The sheath is pulled into the left external iliac. Oblique view is obtained and a StarClose device deployed without difficulty. There are no immediate complications. It should be noted that 4000 units of heparin was given in addition to the thrombolytics.   INTERPRETATION: Initial views demonstrate the iliac common femoral and profunda femoris on the right lower extremity are widely patent, SFA with its previously placed stents noted is thrombosed, distal vasculature is preserved when compared to the previous study. Following lysis angioplasty and addition of two stents there is now wide patency SFA preservation of runoff. In the recovery room the patient has a palpable 2+ posterior tibial pulse at the ankle.   SUMMARY: Successful salvage of thrombosed right SFA.   ____________________________ Renford DillsGregory G. Schnier, MD ggs:drc D: 05/01/2012 16:37:18 ET T: 05/02/2012 08:39:19 ET JOB#: [id number]  cc: Renford DillsGregory G. Schnier, MD, <Dictator> Renford DillsGREGORY G SCHNIER MD ELECTRONICALLY SIGNED 05/14/2012 12:58

## 2015-02-15 LAB — SURGICAL PATHOLOGY

## 2015-02-21 NOTE — Op Note (Signed)
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  PROCEDURE PERFORMED: Debridement with excision of ulcerated area, right above-knee amputation stump.   SURGEON: Renford DillsGregory G. Nadine Ryle, MD   ANESTHESIA: General by LMA.   FLUIDS: Per anesthesia record.   ESTIMATED BLOOD LOSS: Minimal.   SPECIMEN: Debrided tissue to pathology for permanent section.   INDICATIONS: 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 CT scan has been obtained, but does not show any bony abnormality or abscess. She is, therefore, undergoing excision and debridement. Risks and benefits are reviewed. All questions answered. The patient has agreed to proceed.   DESCRIPTION OF PROCEDURE: The patient is taken to the operating room and placed in the supine position. After adequate general anesthesia is induced and appropriate invasive monitors are placed, her right above-knee amputation stump is prepped and draped in a sterile fashion. Appropriate timeout is called.   Marcaine 0.25% with epinephrine is then infiltrate in the soft tissues surrounding the ulcerated area. An elliptical incision is created, which measures 5.5 cm from the apex to apex. This is created with a 15 blade scalpel and the scalpel is used to dissect the tissues through the skin and soft tissues down to the fascia. The segment of tissue is then removed in its entirety with Metzenbaum scissors. The fascia is exposed and the soft tissue surrounding this is debrided as well. The fascia appears to be healthy. A small defect is noted, but,  otherwise there are no abnormalities. The wound is then irrigated with saline; 3-0 Vicryl is used to reapproximate the deep tissues and the skin is reapproximated with interrupted 3-0 nylon vertical mattress sutures. Antibiotic ointment and a sterile dressing is applied. The patient tolerated the procedure well. The tissue has been debrided with both a 15 blade scalpel and scissors. Tissue included skin and subcutaneous tissues. Fascia was noted to be healthy. The excisional specimen was sent to pathology.    ____________________________ Renford DillsGregory G. Ralphael Southgate, MD ggs:bm D: 12/24/2014 17:13:49 ET T: 12/25/2014 02:43:00 ET JOB#: [id number]  cc: Renford DillsGregory G. Sallie Staron, MD, <Dictator> Renford DillsGREGORY G Wallie Lagrand MD ELECTRONICALLY SIGNED 12/29/2014 14:28

## 2015-08-02 ENCOUNTER — Other Ambulatory Visit: Payer: Self-pay | Admitting: Vascular Surgery

## 2015-08-05 ENCOUNTER — Other Ambulatory Visit
Admission: RE | Admit: 2015-08-05 | Discharge: 2015-08-05 | Disposition: A | Discharge status: Home / Self Care | Payer: Medicare Other | Source: Ambulatory Visit | Attending: Vascular Surgery | Admitting: Vascular Surgery

## 2015-08-05 LAB — BUN: BUN: 11 mg/dL (ref 6–20)

## 2015-08-05 LAB — CREATININE, SERUM
Creatinine, Ser: 1.29 mg/dL — ABNORMAL HIGH (ref 0.44–1.00)
GFR calc Af Amer: 51 mL/min — ABNORMAL LOW
GFR calc non Af Amer: 44 mL/min — ABNORMAL LOW

## 2015-08-10 ENCOUNTER — Inpatient Hospital Stay
Admission: RE | Admit: 2015-08-10 | Discharge: 2015-08-17 | DRG: 270 | Disposition: A | Discharge status: Home Health | Payer: Medicare Other | Source: Ambulatory Visit | Attending: Vascular Surgery | Admitting: Vascular Surgery

## 2015-08-10 ENCOUNTER — Encounter
Admission: RE | Disposition: A | Discharge status: Home Health | Payer: Self-pay | Source: Ambulatory Visit | Attending: Vascular Surgery

## 2015-08-10 ENCOUNTER — Encounter: Payer: Self-pay | Admitting: *Deleted

## 2015-08-10 DIAGNOSIS — I998 Other disorder of circulatory system: Secondary | ICD-10-CM | POA: Diagnosis present

## 2015-08-10 DIAGNOSIS — E1121 Type 2 diabetes mellitus with diabetic nephropathy: Secondary | ICD-10-CM | POA: Diagnosis present

## 2015-08-10 DIAGNOSIS — Z7902 Long term (current) use of antithrombotics/antiplatelets: Secondary | ICD-10-CM | POA: Diagnosis not present

## 2015-08-10 DIAGNOSIS — E1122 Type 2 diabetes mellitus with diabetic chronic kidney disease: Secondary | ICD-10-CM | POA: Diagnosis present

## 2015-08-10 DIAGNOSIS — J9601 Acute respiratory failure with hypoxia: Secondary | ICD-10-CM | POA: Diagnosis not present

## 2015-08-10 DIAGNOSIS — Z794 Long term (current) use of insulin: Secondary | ICD-10-CM

## 2015-08-10 DIAGNOSIS — F17200 Nicotine dependence, unspecified, uncomplicated: Secondary | ICD-10-CM | POA: Diagnosis present

## 2015-08-10 DIAGNOSIS — I509 Heart failure, unspecified: Secondary | ICD-10-CM

## 2015-08-10 DIAGNOSIS — J9589 Other postprocedural complications and disorders of respiratory system, not elsewhere classified: Secondary | ICD-10-CM | POA: Diagnosis present

## 2015-08-10 DIAGNOSIS — R05 Cough: Secondary | ICD-10-CM

## 2015-08-10 DIAGNOSIS — G894 Chronic pain syndrome: Secondary | ICD-10-CM | POA: Diagnosis present

## 2015-08-10 DIAGNOSIS — Z89611 Acquired absence of right leg above knee: Secondary | ICD-10-CM

## 2015-08-10 DIAGNOSIS — F17210 Nicotine dependence, cigarettes, uncomplicated: Secondary | ICD-10-CM | POA: Diagnosis present

## 2015-08-10 DIAGNOSIS — J441 Chronic obstructive pulmonary disease with (acute) exacerbation: Secondary | ICD-10-CM | POA: Diagnosis present

## 2015-08-10 DIAGNOSIS — Z89511 Acquired absence of right leg below knee: Secondary | ICD-10-CM | POA: Diagnosis not present

## 2015-08-10 DIAGNOSIS — J984 Other disorders of lung: Secondary | ICD-10-CM | POA: Diagnosis not present

## 2015-08-10 DIAGNOSIS — N183 Chronic kidney disease, stage 3 (moderate): Secondary | ICD-10-CM | POA: Diagnosis present

## 2015-08-10 DIAGNOSIS — R059 Cough, unspecified: Secondary | ICD-10-CM

## 2015-08-10 DIAGNOSIS — Z79899 Other long term (current) drug therapy: Secondary | ICD-10-CM | POA: Diagnosis not present

## 2015-08-10 DIAGNOSIS — N179 Acute kidney failure, unspecified: Secondary | ICD-10-CM | POA: Diagnosis not present

## 2015-08-10 DIAGNOSIS — Z7952 Long term (current) use of systemic steroids: Secondary | ICD-10-CM | POA: Diagnosis not present

## 2015-08-10 DIAGNOSIS — Z89619 Acquired absence of unspecified leg above knee: Secondary | ICD-10-CM | POA: Diagnosis not present

## 2015-08-10 DIAGNOSIS — J189 Pneumonia, unspecified organism: Secondary | ICD-10-CM | POA: Diagnosis not present

## 2015-08-10 DIAGNOSIS — E1151 Type 2 diabetes mellitus with diabetic peripheral angiopathy without gangrene: Secondary | ICD-10-CM | POA: Diagnosis present

## 2015-08-10 DIAGNOSIS — N141 Nephropathy induced by other drugs, medicaments and biological substances: Secondary | ICD-10-CM | POA: Diagnosis not present

## 2015-08-10 DIAGNOSIS — E1142 Type 2 diabetes mellitus with diabetic polyneuropathy: Secondary | ICD-10-CM | POA: Diagnosis present

## 2015-08-10 DIAGNOSIS — J9811 Atelectasis: Secondary | ICD-10-CM | POA: Diagnosis not present

## 2015-08-10 DIAGNOSIS — I129 Hypertensive chronic kidney disease with stage 1 through stage 4 chronic kidney disease, or unspecified chronic kidney disease: Secondary | ICD-10-CM | POA: Diagnosis present

## 2015-08-10 DIAGNOSIS — I70223 Atherosclerosis of native arteries of extremities with rest pain, bilateral legs: Principal | ICD-10-CM | POA: Diagnosis present

## 2015-08-10 DIAGNOSIS — L8990 Pressure ulcer of unspecified site, unspecified stage: Secondary | ICD-10-CM | POA: Insufficient documentation

## 2015-08-10 DIAGNOSIS — G9340 Encephalopathy, unspecified: Secondary | ICD-10-CM | POA: Diagnosis not present

## 2015-08-10 DIAGNOSIS — Z7984 Long term (current) use of oral hypoglycemic drugs: Secondary | ICD-10-CM

## 2015-08-10 HISTORY — PX: PERIPHERAL VASCULAR CATHETERIZATION: SHX172C

## 2015-08-10 LAB — BASIC METABOLIC PANEL
Anion gap: 6 (ref 5–15)
BUN: 16 mg/dL (ref 6–20)
CALCIUM: 9.4 mg/dL (ref 8.9–10.3)
CHLORIDE: 107 mmol/L (ref 101–111)
CO2: 28 mmol/L (ref 22–32)
CREATININE: 1.27 mg/dL — AB (ref 0.44–1.00)
GFR calc Af Amer: 52 mL/min — ABNORMAL LOW (ref >60)
GFR calc non Af Amer: 45 mL/min — ABNORMAL LOW (ref >60)
GLUCOSE: 168 mg/dL — AB (ref 65–99)
Potassium: 3.6 mmol/L (ref 3.5–5.1)
Sodium: 141 mmol/L (ref 135–145)

## 2015-08-10 LAB — MRSA PCR SCREENING: MRSA by PCR: NEGATIVE

## 2015-08-10 LAB — GLUCOSE, CAPILLARY
Glucose-Capillary: 170 mg/dL — ABNORMAL HIGH (ref 65–99)
Glucose-Capillary: 195 mg/dL — ABNORMAL HIGH (ref 65–99)

## 2015-08-10 SURGERY — LOWER EXTREMITY ANGIOGRAPHY
Anesthesia: Moderate Sedation | Laterality: Right

## 2015-08-10 MED ORDER — FENTANYL CITRATE (PF) 100 MCG/2ML IJ SOLN
INTRAMUSCULAR | Status: AC
  Filled 2015-08-10: qty 2

## 2015-08-10 MED ORDER — METOPROLOL TARTRATE 1 MG/ML IV SOLN
5.0000 mg | Freq: Four times a day (QID) | INTRAVENOUS | Status: DC
  Administered 2015-08-10 – 2015-08-11 (×3): 5 mg via INTRAVENOUS
  Filled 2015-08-10 (×5): qty 5

## 2015-08-10 MED ORDER — SODIUM CHLORIDE 0.9 % IV SOLN
INTRAVENOUS | Status: AC
  Administered 2015-08-10: 17:00:00 via INTRAVENOUS

## 2015-08-10 MED ORDER — HEPARIN SODIUM (PORCINE) 1000 UNIT/ML IJ SOLN
INTRAMUSCULAR | Status: AC
  Filled 2015-08-10: qty 1

## 2015-08-10 MED ORDER — LIDOCAINE HCL (PF) 1 % IJ SOLN
INTRAMUSCULAR | Status: AC
  Filled 2015-08-10: qty 20

## 2015-08-10 MED ORDER — HYDROMORPHONE HCL 1 MG/ML IJ SOLN
1.0000 mg | INTRAMUSCULAR | Status: DC | PRN
  Administered 2015-08-10: 1 mg via INTRAVENOUS
  Filled 2015-08-10: qty 1

## 2015-08-10 MED ORDER — MIDAZOLAM HCL 2 MG/2ML IJ SOLN
INTRAMUSCULAR | Status: AC
  Filled 2015-08-10: qty 2

## 2015-08-10 MED ORDER — DULOXETINE HCL 60 MG PO CPEP
60.0000 mg | ORAL_CAPSULE | Freq: Every day | ORAL | Status: DC
  Administered 2015-08-11 – 2015-08-17 (×7): 60 mg via ORAL
  Filled 2015-08-10: qty 1
  Filled 2015-08-10: qty 2
  Filled 2015-08-10 (×6): qty 1

## 2015-08-10 MED ORDER — TIROFIBAN HCL IV 5 MG/100ML
0.0750 ug/kg/min | INTRAVENOUS | Status: DC
  Administered 2015-08-10: 0.075 ug/kg/min via INTRAVENOUS

## 2015-08-10 MED ORDER — FUROSEMIDE 20 MG PO TABS
20.0000 mg | ORAL_TABLET | ORAL | Status: AC
  Administered 2015-08-10: 20 mg via ORAL
  Filled 2015-08-10: qty 1

## 2015-08-10 MED ORDER — HYDROMORPHONE HCL 1 MG/ML IJ SOLN
INTRAMUSCULAR | Status: AC
  Filled 2015-08-10: qty 1

## 2015-08-10 MED ORDER — CLOPIDOGREL BISULFATE 75 MG PO TABS
75.0000 mg | ORAL_TABLET | Freq: Every day | ORAL | Status: DC
  Administered 2015-08-11 – 2015-08-17 (×7): 75 mg via ORAL
  Filled 2015-08-10 (×7): qty 1

## 2015-08-10 MED ORDER — SODIUM CHLORIDE 0.9 % IV SOLN
INTRAVENOUS | Status: DC
  Administered 2015-08-10: 10:00:00 via INTRAVENOUS

## 2015-08-10 MED ORDER — LABETALOL HCL 5 MG/ML IV SOLN
10.0000 mg | INTRAVENOUS | Status: DC | PRN
  Filled 2015-08-10: qty 4

## 2015-08-10 MED ORDER — OXYCODONE HCL 5 MG PO TABS
10.0000 mg | ORAL_TABLET | ORAL | Status: DC | PRN
  Administered 2015-08-10 – 2015-08-17 (×10): 10 mg via ORAL
  Filled 2015-08-10 (×10): qty 2

## 2015-08-10 MED ORDER — INSULIN ASPART 100 UNIT/ML ~~LOC~~ SOLN
0.0000 [IU] | SUBCUTANEOUS | Status: DC
  Administered 2015-08-10 (×2): 4 [IU] via SUBCUTANEOUS
  Administered 2015-08-11: 2 [IU] via SUBCUTANEOUS
  Administered 2015-08-11 (×2): 8 [IU] via SUBCUTANEOUS
  Administered 2015-08-11: 20 [IU] via SUBCUTANEOUS
  Administered 2015-08-11 – 2015-08-12 (×3): 4 [IU] via SUBCUTANEOUS
  Administered 2015-08-12: 2 [IU] via SUBCUTANEOUS
  Administered 2015-08-12: 4 [IU] via SUBCUTANEOUS
  Administered 2015-08-12: 16 [IU] via SUBCUTANEOUS
  Administered 2015-08-12: 4 [IU] via SUBCUTANEOUS
  Administered 2015-08-12: 2 [IU] via SUBCUTANEOUS
  Administered 2015-08-12 – 2015-08-13 (×2): 4 [IU] via SUBCUTANEOUS
  Administered 2015-08-13: 12 [IU] via SUBCUTANEOUS
  Administered 2015-08-13 (×2): 20 [IU] via SUBCUTANEOUS
  Administered 2015-08-13: 12 [IU] via SUBCUTANEOUS
  Administered 2015-08-14: 8 [IU] via SUBCUTANEOUS
  Administered 2015-08-14 (×2): 2 [IU] via SUBCUTANEOUS
  Administered 2015-08-14: 4 [IU] via SUBCUTANEOUS
  Administered 2015-08-14: 8 [IU] via SUBCUTANEOUS
  Administered 2015-08-14: 16 [IU] via SUBCUTANEOUS
  Administered 2015-08-15: 8 [IU] via SUBCUTANEOUS
  Administered 2015-08-15: 2 [IU] via SUBCUTANEOUS
  Administered 2015-08-15 (×2): 8 [IU] via SUBCUTANEOUS
  Administered 2015-08-15: 4 [IU] via SUBCUTANEOUS
  Administered 2015-08-16: 2 [IU] via SUBCUTANEOUS
  Administered 2015-08-16: 12 [IU] via SUBCUTANEOUS
  Administered 2015-08-16: 2 [IU] via SUBCUTANEOUS
  Administered 2015-08-16: 12 [IU] via SUBCUTANEOUS
  Administered 2015-08-16 – 2015-08-17 (×2): 2 [IU] via SUBCUTANEOUS
  Administered 2015-08-17 (×2): 8 [IU] via SUBCUTANEOUS
  Filled 2015-08-10 (×2): qty 4
  Filled 2015-08-10: qty 8
  Filled 2015-08-10: qty 2
  Filled 2015-08-10 (×2): qty 8
  Filled 2015-08-10 (×2): qty 4
  Filled 2015-08-10: qty 16
  Filled 2015-08-10 (×4): qty 2
  Filled 2015-08-10: qty 16
  Filled 2015-08-10: qty 4
  Filled 2015-08-10: qty 2
  Filled 2015-08-10: qty 4
  Filled 2015-08-10: qty 20
  Filled 2015-08-10: qty 12
  Filled 2015-08-10: qty 4
  Filled 2015-08-10: qty 2
  Filled 2015-08-10: qty 4
  Filled 2015-08-10: qty 8
  Filled 2015-08-10: qty 2
  Filled 2015-08-10: qty 16
  Filled 2015-08-10: qty 12
  Filled 2015-08-10: qty 2
  Filled 2015-08-10: qty 5
  Filled 2015-08-10: qty 4
  Filled 2015-08-10: qty 8
  Filled 2015-08-10: qty 2
  Filled 2015-08-10: qty 12
  Filled 2015-08-10: qty 4
  Filled 2015-08-10 (×2): qty 8
  Filled 2015-08-10: qty 4
  Filled 2015-08-10 (×2): qty 8
  Filled 2015-08-10: qty 12
  Filled 2015-08-10: qty 20
  Filled 2015-08-10: qty 4

## 2015-08-10 MED ORDER — POTASSIUM CHLORIDE CRYS ER 20 MEQ PO TBCR
30.0000 meq | EXTENDED_RELEASE_TABLET | Freq: Once | ORAL | Status: AC
  Administered 2015-08-10: 30 meq via ORAL
  Filled 2015-08-10: qty 1

## 2015-08-10 MED ORDER — HEPARIN SODIUM (PORCINE) 1000 UNIT/ML IJ SOLN
INTRAMUSCULAR | Status: DC | PRN
  Administered 2015-08-10: 5000 [IU] via INTRAVENOUS
  Administered 2015-08-10 (×2): 2000 [IU] via INTRAVENOUS

## 2015-08-10 MED ORDER — CETYLPYRIDINIUM CHLORIDE 0.05 % MT LIQD
7.0000 mL | Freq: Two times a day (BID) | OROMUCOSAL | Status: DC
  Administered 2015-08-10 – 2015-08-16 (×8): 7 mL via OROMUCOSAL

## 2015-08-10 MED ORDER — FENTANYL CITRATE (PF) 100 MCG/2ML IJ SOLN
INTRAMUSCULAR | Status: DC | PRN
  Administered 2015-08-10: 50 ug via INTRAVENOUS
  Administered 2015-08-10: 100 ug via INTRAVENOUS
  Administered 2015-08-10 (×2): 50 ug via INTRAVENOUS
  Administered 2015-08-10: 150 ug via INTRAVENOUS

## 2015-08-10 MED ORDER — ALLOPURINOL 100 MG PO TABS
100.0000 mg | ORAL_TABLET | Freq: Every day | ORAL | Status: DC
  Administered 2015-08-10 – 2015-08-17 (×8): 100 mg via ORAL
  Filled 2015-08-10 (×9): qty 1

## 2015-08-10 MED ORDER — MIDAZOLAM HCL 5 MG/5ML IJ SOLN
INTRAMUSCULAR | Status: AC
  Filled 2015-08-10: qty 5

## 2015-08-10 MED ORDER — PANTOPRAZOLE SODIUM 40 MG PO TBEC
40.0000 mg | DELAYED_RELEASE_TABLET | Freq: Two times a day (BID) | ORAL | Status: DC
  Administered 2015-08-10 – 2015-08-17 (×14): 40 mg via ORAL
  Filled 2015-08-10 (×15): qty 1

## 2015-08-10 MED ORDER — METHOCARBAMOL 500 MG PO TABS
500.0000 mg | ORAL_TABLET | Freq: Four times a day (QID) | ORAL | Status: DC
  Administered 2015-08-10 – 2015-08-17 (×25): 500 mg via ORAL
  Filled 2015-08-10 (×29): qty 1

## 2015-08-10 MED ORDER — CLOPIDOGREL BISULFATE 75 MG PO TABS
150.0000 mg | ORAL_TABLET | ORAL | Status: AC
Start: 2015-08-10 — End: 2015-08-10
  Administered 2015-08-10: 150 mg via ORAL
  Filled 2015-08-10: qty 2

## 2015-08-10 MED ORDER — SENNOSIDES-DOCUSATE SODIUM 8.6-50 MG PO TABS: 2.0000 | ORAL_TABLET | Freq: Two times a day (BID) | ORAL | Status: DC | PRN

## 2015-08-10 MED ORDER — HYDROMORPHONE HCL 1 MG/ML IJ SOLN
1.0000 mg | Freq: Once | INTRAMUSCULAR | Status: AC
  Administered 2015-08-10: 1 mg via INTRAVENOUS

## 2015-08-10 MED ORDER — MIDAZOLAM HCL 2 MG/2ML IJ SOLN
INTRAMUSCULAR | Status: DC | PRN
  Administered 2015-08-10: 2 mg via INTRAVENOUS
  Administered 2015-08-10: 1 mg via INTRAVENOUS
  Administered 2015-08-10 (×2): 2 mg via INTRAVENOUS
  Administered 2015-08-10: 3 mg via INTRAVENOUS

## 2015-08-10 MED ORDER — AMITRIPTYLINE HCL 50 MG PO TABS
50.0000 mg | ORAL_TABLET | Freq: Every day | ORAL | Status: DC
  Administered 2015-08-10 – 2015-08-16 (×6): 50 mg via ORAL
  Filled 2015-08-10 (×3): qty 1
  Filled 2015-08-10: qty 2
  Filled 2015-08-10: qty 1
  Filled 2015-08-10: qty 2
  Filled 2015-08-10: qty 1

## 2015-08-10 MED ORDER — HYDRALAZINE HCL 20 MG/ML IJ SOLN: 5.0000 mg | INTRAMUSCULAR | Status: DC | PRN

## 2015-08-10 MED ORDER — DEXTROSE 5 % IV SOLN
INTRAVENOUS | Status: AC
  Administered 2015-08-10: 11:00:00
  Filled 2015-08-10: qty 1.5

## 2015-08-10 MED ORDER — CEFAZOLIN SODIUM 1-5 GM-% IV SOLN
INTRAVENOUS | Status: AC
  Filled 2015-08-10: qty 50

## 2015-08-10 MED ORDER — LIDOCAINE HCL (PF) 1 % IJ SOLN
INTRAMUSCULAR | Status: DC | PRN
  Administered 2015-08-10: 5 mL via INTRADERMAL

## 2015-08-10 MED ORDER — OXYCODONE HCL ER 10 MG PO T12A
20.0000 mg | EXTENDED_RELEASE_TABLET | Freq: Two times a day (BID) | ORAL | Status: DC
  Administered 2015-08-10 – 2015-08-12 (×4): 20 mg via ORAL
  Filled 2015-08-10 (×4): qty 2

## 2015-08-10 MED ORDER — DEXTROSE 5 % IV SOLN: 1.5000 g | INTRAVENOUS | Status: DC

## 2015-08-10 MED ORDER — PREGABALIN 75 MG PO CAPS
300.0000 mg | ORAL_CAPSULE | Freq: Two times a day (BID) | ORAL | Status: DC
  Administered 2015-08-10 – 2015-08-17 (×14): 300 mg via ORAL
  Filled 2015-08-10 (×14): qty 4

## 2015-08-10 MED ORDER — HEPARIN (PORCINE) IN NACL 2-0.9 UNIT/ML-% IJ SOLN
INTRAMUSCULAR | Status: AC
  Filled 2015-08-10: qty 1000

## 2015-08-10 MED ORDER — ROSUVASTATIN CALCIUM 10 MG PO TABS
20.0000 mg | ORAL_TABLET | Freq: Every day | ORAL | Status: DC
  Administered 2015-08-10 – 2015-08-17 (×8): 20 mg via ORAL
  Filled 2015-08-10 (×11): qty 2

## 2015-08-10 MED ORDER — IOHEXOL 300 MG/ML  SOLN
INTRAMUSCULAR | Status: DC | PRN
Start: 2015-08-10 — End: 2015-08-10
  Administered 2015-08-10: 95 mL via INTRA_ARTERIAL

## 2015-08-10 SURGICAL SUPPLY — 60 items
BALLN ARMADA 12X20X80 (BALLOONS) ×4
BALLN ARMADA 9X20X135 (BALLOONS) ×4
BALLN DORADO 7X80X80 (BALLOONS) ×4
BALLN DORADO 8X100X80 (BALLOONS) ×4
BALLN LUTONIX 6X150X130 (BALLOONS) ×8
BALLN LUTONIX DCB 6X80X130 (BALLOONS) ×4
BALLN LUTONIX DCB 7X40X130 (BALLOONS) ×4
BALLN ULTRVRSE 5X200X130 (BALLOONS) ×4
BALLN ULTRVRSE 5X20X130 (BALLOONS) ×4 IMPLANT
BALLN ULTRVRSE 5X40X150 (BALLOONS) ×4 IMPLANT
BALLN ULTRVRSE 6X40X130 (BALLOONS) ×2
BALLN ULTRVRSE 6X40X130 OTE (BALLOONS) ×2
BALLOON ARMADA 12X20X80 (BALLOONS) ×2 IMPLANT
BALLOON ARMADA 9X20X135 (BALLOONS) ×2 IMPLANT
BALLOON DORADO 7X80X80 (BALLOONS) ×2 IMPLANT
BALLOON DORADO 8X100X80 (BALLOONS) ×2 IMPLANT
BALLOON LUTONIX 6X150X130 (BALLOONS) ×4 IMPLANT
BALLOON LUTONIX DCB 6X80X130 (BALLOONS) ×2 IMPLANT
BALLOON LUTONIX DCB 7X40X130 (BALLOONS) ×2 IMPLANT
BALLOON ULTRVRSE 5X200X130 (BALLOONS) ×2 IMPLANT
BALLOON ULTRVRSE 6X40X130 OTE (BALLOONS) ×2 IMPLANT
CANNULA 5F STIFF (CANNULA) ×4 IMPLANT
CATH CROSSER 14S OTW 146CM (CATHETERS) ×4 IMPLANT
CATH ROYAL FLUSH PIG 5F 70CM (CATHETERS) ×4 IMPLANT
CATH SIDEKICK XL ST 110CM (SHEATH) ×4 IMPLANT
CATH VERT 100CM (CATHETERS) ×4 IMPLANT
DEVICE CLOSURE MYNXGRIP 6/7F (Vascular Products) ×4 IMPLANT
DEVICE PRESTO INFLATION (MISCELLANEOUS) ×8 IMPLANT
DEVICE SOLENT OMNI 120CM (CATHETERS) ×4 IMPLANT
DRAPE INCISE IOBAN 66X45 STRL (DRAPES) ×4 IMPLANT
DRAPE TABLE BACK 80X90 (DRAPES) ×4 IMPLANT
GLIDECATH NONTAPER ANGL 5FR (CATHETERS) ×4 IMPLANT
GLIDEWIRE ANGLED SS 035X260CM (WIRE) ×4 IMPLANT
GUIDEWIRE AMPLATZ SHORT (WIRE) ×4 IMPLANT
GUIDEWIRE SUPER STIFF .035X180 (WIRE) ×4 IMPLANT
KIT FLOWMATE PROCEDURAL (MISCELLANEOUS) ×4 IMPLANT
LIFESTENT 7X40X130 (Permanent Stent) ×4 IMPLANT
LIFESTENT 7X60X130 (Permanent Stent) ×4 IMPLANT
LIFESTENT 7X80X130 (Permanent Stent) ×4 IMPLANT
PACK ANGIOGRAPHY (CUSTOM PROCEDURE TRAY) ×4 IMPLANT
SHEATH  FLEX ANSEL 8FRX45 (SHEATH) ×4
SHEATH ANL2 6FRX45 HC (SHEATH) ×4 IMPLANT
SHEATH AVANTI 4FRX11 (SHEATH) ×4 IMPLANT
SHEATH BRITE TIP 5FRX11 (SHEATH) ×4 IMPLANT
SHEATH BRITE TIP 6FRX11 (SHEATH) ×4 IMPLANT
SHEATH BRITE TIP 7FRX11 (SHEATH) ×8 IMPLANT
SHEATH FLEX ANSEL 8FRX45 (SHEATH) ×4 IMPLANT
SHEATH FLEXOR ANSEL2 7FRX45 (SHEATH) ×4 IMPLANT
SHEATH MICROPUNCTURE PEDAL 4FR (INTRODUCER) ×4 IMPLANT
STENT OMNILINK ELITE 7X39X135 (Permanent Stent) ×4 IMPLANT
STENT VIABAHN 7X25X120 (Permanent Stent) ×4 IMPLANT
STENT VIABAHN 7X50X120 (Permanent Stent) ×2 IMPLANT
STENT VIABAHN 7X5X120 7FR (Permanent Stent) ×2 IMPLANT
SYR MEDRAD MARK V 150ML (SYRINGE) ×4 IMPLANT
TOWEL OR 17X26 4PK STRL BLUE (TOWEL DISPOSABLE) ×8 IMPLANT
TUBING CONTRAST HIGH PRESS 72 (TUBING) ×4 IMPLANT
WIRE G V18X300CM (WIRE) ×8 IMPLANT
WIRE J 3MM .035X145CM (WIRE) ×4 IMPLANT
WIRE MAGIC TORQUE 260C (WIRE) ×4 IMPLANT
WIRE SPARTACORE .014X300CM (WIRE) ×4 IMPLANT

## 2015-08-10 NOTE — Progress Notes (Signed)
Postop night of surgery status post revascularization left leg  Patient complains of generalized pain.  O2 sats in the low 90s now on 2 L nasal cannula oxygen with sats in the mid to high 90s Blood pressure 148/62 heart rate of 78 Groin is clean dry and intact in the left foot has had 2+ palpable dorsalis pedis pulse  Assessment: Patient is status post successful revascularization.  Plan: Patient will continue Aggrastat until tomorrow I will give her a dose of Lasix and reassess her room air oxygen in the morning

## 2015-08-10 NOTE — Op Note (Signed)
[facility] Percutaneous Study/Intervention Procedural Note   Date of Surgery: 08/10/2015  Surgeon:  Katha Cabal, MD.  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 Introduction catheter into left lower extremity 3rd order catheter placement with placement into the tibial vessels and additional third order catheter placement with placement of catheter into the profunda femoris  2. Contrast injection left lower extremity for distal runoff   3. Crosser atherectomy of the left SFA and popliteal arteries 4.  Percutaneous transluminal angioplasty and stent placement left superficial femoral artery and popliteal             5.   Percutaneous transluminal angioplasty of the left profunda femoris artery using kissing balloon technique             6.   Angiojet mechanical thrombectomy with the Omni device             7.   Minx close closure right common femoral arteriotomy  Anesthesia: Conscious sedation with IV Versed and fentanyl  Sheath: 8 French Ansell  Contrast: 95 cc  Fluoroscopy Time: 31.2 minutes  Indications: [name] [name] presents with ulceration and rest pain of the left foot. The patient is already undergone right above-the-knee amputation. She is undergoing angiography with the hope for intervention for limb salvage. The risks and benefits are reviewed all questions answered patient agrees to proceed.  Procedure: Sabrina Keough is a 59 y.o. y.o. female who was identified and appropriate procedural time out was performed. The patient was then placed supine on the table and prepped and draped in the usual sterile fashion.   Ultrasound was placed in the sterile sleeve and the right groin was  evaluated the right common femoral artery was echolucent and pulsatile indicating patency.  Image was recorded for the permanent record and under real-time visualization a microneedle was inserted into the common femoral artery microwire followed by a micro-sheath.  A J-wire was then advanced through the micro-sheath and a  5 [place] sheath was then inserted over a J-wire. J-wire was then advanced and a 5 French pigtail catheter was positioned at the level of T12. AP projection of the aorta was then obtained. Pigtail catheter was repositioned to above the bifurcation and a RAO view of the pelvis was obtained.  Subsequently a pigtail catheter with the stiff angle Glidewire was used to cross the aortic bifurcation the catheter wire were advanced down into the left distal external iliac artery. Oblique view of the femoral bifurcation was then obtained and subsequently the wire was reintroduced and the pigtail catheter negotiated into the SFA representing third order catheter placement. Distal runoff was then performed.  5000 units of heparin was then given and allowed to circulate and a 6 [place] Ansell sheath was advanced up and over the bifurcation and positioned in the femoral artery  The 14 S Crosser catheter was then prepped on the field and a straight micro- catheter was advanced into the cul-de-sac of the SFA under magnified imaging in the LAO projection. Using the Crosser catheter the occlusion was crossed and a KMP  catheter and stiff angle Glidewire were then negotiated down into the distal popliteal.  Distal runoff was then completed by hand injection through the catheter, this verified intraluminal placement distally.     A 5 x 15 balloon was used to angioplasty the superficial femoral  and popliteal arteries. Inflations were to 12 atmospheres for 2 minutes. Follow-up imaging demonstrated patency with adequate preparation of the vessel for a drug-coated balloon.  Subsequently, a 6 x 15 Lutonix balloon 2  and a 6 x 8 Lutonix was utilized inflating to 12 atm for 2 full minutes to angioplasty the SFA across the entire occluded segment essentially from the at knee popliteal to the origin. Follow-up imaging demonstrated greater than 50% residual stenosis in 2 separate locations and therefore a 7 x 80 and a 7 x 40 life stent was deployed and subsequently postdilated with a 6 mm. Distal runoff was then reassessed. There is incomplete expansion of the stent at Hunter's canal and therefore a 7 x 60 life stent is deployed overlapping through the narrowed area and this is then postdilated using a Tirado 7 mm balloon. This area on follow-up imaging remains very undersized and there is now thrombus noted in the distal margin of the stent as well as at 2 locations within the stent. Therefore the AngioJet is prepped on the field advanced over the wire and several passes are made with the AngioJet to performed thrombectomy. A large amount of the thrombus is removed however residual hemodynamically significant material is still noted and after review of the shape or contour of the stent it is elected to place a Viabahn. Stiff wires reintroduced through a catheter in exchange for the Magic torque wire and socially a 7 [place] is placed however this Alita Chyle does not allow for passage of the Viabahn and ultimately an 8 [place] Ansell sheath is positioned with its tip in the left common femoral catheter is then reintroduced and a VAT wire is exchanged subsequently an 7x 25 Viabahn stent is deployed and additional 7 x 5 Viabahn stent was deployed up to the level of the origin of the SFA these were then postdilated with an 8 x 10 [place] to 20 atm. Follow-up imaging now demonstrates patency throughout the SFA popliteal and preservation of distal runoff however there is a 60-70% narrowing in the at the origin of the profunda.  At this point the [facility] sheath is repositioned and the 80% narrowing noted in the distal common and proximal  external iliac artery is addressed a 7 x 39 Omnilink stent is deployed the portion of the stent within the external iliac is postdilated to 8 mm. The portion of the Omnilink within the common iliac artery is flared to 12 mm using separate balloon inflations. Follow-up imaging demonstrates an excellent result with less than 5% residual stenosis. The sheath is then advanced through the stent after the dilators reinserted and repositioned back in the common femoral on the left. A Kumpe catheter is then advanced over the 018 wire and an additional 014 wire is introduced. The Kumpe catheter is then advanced exclusively over the 014 and this is negotiated into the profunda femoris. Hand injection demonstrates the distal profunda femoris runoff which is intact and successful crossing of the stenosis at the origin. Subsequently a 5 x 2 balloon is advanced down the 018 and positioned at the origin of the SFA in the previously placed Viabahn stent and a 5 x 4 via track balloon is advanced into the profunda femoris. Simultaneous balloon inflations were performed in a kissing balloon technique inflations are to 14 atm for approximately 1-1/2 minutes. Follow-up imaging demonstrates less than 20% residual stenosis in the profunda femoris and preservation of the ostia of the SFA. Distal runoff is then once again performed demonstrating  preservation of the tibials and the procedure was concluded by pulling the sheath into the right external oblique views obtained and a minx device is deployed successfully.  Findings: Initial views demonstrate the abdominal aorta is diffusely diseased but there are no hemodynamically significant lesions. Bilateral common iliacs are widely patent. Bilateral internal iliacs are diffusely diseased. The right external iliac is diffusely diseased. The left distal common iliac and the proximal external iliac demonstrated previously stent with a greater than 90% stenosis at the origin of the external  superior to be a recurrent lesion and this is later treated with a balloon and plasty and stent placement as noted above using a balloon expandable stent. Common femoral is diffusely diseased but there are no hematemesis significant lesions on the left. The origin of the profunda has a greater than 90% stenosis. SFA is occluded. There is reconstitution of the mid popliteal and 2 vessel runoff anterior tibial is relatively patent disease but no focal critical stenosis. Posterior tibial demonstrates diffuse disease throughout its course peroneal is occluded.  Following Crosser atherectomy there is successful catheter placement across the occluded SFA. Following initial stent placement there is thrombus noted and this is treated first with thrombectomy and then subsequently with secondarily stenting using 8 Viabahn stent. This is successful. Subsequently the ostial lesion within the profunda femoris separate and distinct from the SFA and popliteal is treated with balloon angioplasty.  Summary: Successful multilevel reconstruction for limb salvage including stent placement within the iliac system stent placement within the SFA thrombectomy of the SFA and popliteal as well as additional angioplasty of the profunda femoris area  Disposition: Patient was taken to the recovery room in stable condition having tolerated the procedure well.  Schnier, Dolores Lory 07/27/2015,3:14 PM

## 2015-08-11 ENCOUNTER — Encounter: Payer: Self-pay | Admitting: Vascular Surgery

## 2015-08-11 LAB — BASIC METABOLIC PANEL
ANION GAP: 8 (ref 5–15)
BUN: 19 mg/dL (ref 6–20)
CALCIUM: 8.4 mg/dL — AB (ref 8.9–10.3)
CO2: 26 mmol/L (ref 22–32)
CREATININE: 1.19 mg/dL — AB (ref 0.44–1.00)
Chloride: 108 mmol/L (ref 101–111)
GFR calc Af Amer: 56 mL/min — ABNORMAL LOW (ref >60)
GFR calc non Af Amer: 49 mL/min — ABNORMAL LOW (ref >60)
GLUCOSE: 198 mg/dL — AB (ref 65–99)
Potassium: 3.6 mmol/L (ref 3.5–5.1)
Sodium: 142 mmol/L (ref 135–145)

## 2015-08-11 LAB — GLUCOSE, CAPILLARY
GLUCOSE-CAPILLARY: 165 mg/dL — AB (ref 65–99)
GLUCOSE-CAPILLARY: 134 mg/dL — AB (ref 65–99)
GLUCOSE-CAPILLARY: 159 mg/dL — AB (ref 65–99)
Glucose-Capillary: 110 mg/dL — ABNORMAL HIGH (ref 65–99)
Glucose-Capillary: 175 mg/dL — ABNORMAL HIGH (ref 65–99)
Glucose-Capillary: 219 mg/dL — ABNORMAL HIGH (ref 65–99)
Glucose-Capillary: 367 mg/dL — ABNORMAL HIGH (ref 65–99)

## 2015-08-11 LAB — HEPARIN INDUCED PLATELET AB (HIT ANTIBODY): Heparin Induced Plt Ab: 0.185 OD (ref 0.000–0.400)

## 2015-08-11 MED ORDER — SENNOSIDES-DOCUSATE SODIUM 8.6-50 MG PO TABS
2.0000 | ORAL_TABLET | Freq: Two times a day (BID) | ORAL | Status: DC
  Administered 2015-08-11 – 2015-08-16 (×12): 2 via ORAL
  Filled 2015-08-11 (×13): qty 2

## 2015-08-11 NOTE — Progress Notes (Signed)
[facility]  Daily Progress Note   Subjective  - 1 Day Post-Op  Patient notes she is having pain all over elected not to get out of bed today  Objective Filed Vitals:   08/11/15 1400 08/11/15 1500 08/11/15 1600 08/11/15 1700  BP: 109/71 98/58 101/63 82/64  Pulse: 98 103 105 103  Temp:   99.4 F (37.4 C)   TempSrc:   Oral   Resp: 18 20 15 15   Height:      Weight:      SpO2: 95% 93% 93% 95%    Intake/Output Summary (Last 24 hours) at 08/11/15 1810 Last data filed at 08/11/15 0957  Gross per 24 hour  Intake  450.9 ml  Output   1700 ml  Net -1249.1 ml    PULM  Normal effort , no use of accessory muscles CV  No JVD, RRR Abd      No distended, nontender VASC  left foot hyperemic with a bounding 2+ dorsalis pedis pulse right groin clean dry and intact  Laboratory CBC    Component Value Date/Time   WBC 12.7* 09/28/2013 0556   WBC 8.3 07/11/2013 0550   HGB 11.5* 09/28/2013 0556   HGB 9.7* 07/11/2013 0550   HCT 34.6* 09/28/2013 0556   HCT 30.2* 07/11/2013 0550   PLT 265 09/28/2013 0556   PLT 562* 07/11/2013 0550    BMET    Component Value Date/Time   NA 142 08/11/2015 0503   NA 137 09/28/2013 0556   K 3.6 08/11/2015 0503   K 3.0* 09/28/2013 0556   CL 108 08/11/2015 0503   CL 99 09/28/2013 0556   CO2 26 08/11/2015 0503   CO2 27 09/28/2013 0556   GLUCOSE 198* 08/11/2015 0503   GLUCOSE 105* 09/28/2013 0556   BUN 19 08/11/2015 0503   BUN 19* 09/28/2013 0556   CREATININE 1.19* 08/11/2015 0503   CREATININE 1.47* 09/28/2013 0556   CALCIUM 8.4* 08/11/2015 0503   CALCIUM 9.0 09/28/2013 0556   GFRNONAA 49* 08/11/2015 0503   GFRNONAA 39* 09/28/2013 0556   GFRAA 56* 08/11/2015 0503   GFRAA 45* 09/28/2013 0556    Assessment/Planning: POD #1 s/p endovascular reconstruction right lower extremity   Patient's Aggrastat will be discontinued and she will be maintained on Plavix and aspirin. She will be transferred to the floor where physical  therapy can assess her function. I would like for her to continue standing exercise with her left leg utilizing a walker. Hopefully she will be discharged tomorrow    Renford DillsSchnier, Matilyn Fehrman G  08/11/2015, 6:10 PM

## 2015-08-12 ENCOUNTER — Inpatient Hospital Stay: Payer: Medicare Other

## 2015-08-12 DIAGNOSIS — J984 Other disorders of lung: Secondary | ICD-10-CM | POA: Diagnosis not present

## 2015-08-12 DIAGNOSIS — J189 Pneumonia, unspecified organism: Secondary | ICD-10-CM | POA: Diagnosis not present

## 2015-08-12 DIAGNOSIS — J9601 Acute respiratory failure with hypoxia: Secondary | ICD-10-CM | POA: Diagnosis not present

## 2015-08-12 LAB — BLOOD GAS, ARTERIAL
ACID-BASE EXCESS: 0.9 mmol/L (ref 0.0–3.0)
Allens test (pass/fail): POSITIVE — AB
BICARBONATE: 27.5 meq/L (ref 21.0–28.0)
FIO2: 0.28
O2 Saturation: 93 %
PCO2 ART: 51 mmHg — AB (ref 32.0–48.0)
PH ART: 7.34 — AB (ref 7.350–7.450)
PO2 ART: 71 mmHg — AB (ref 83.0–108.0)
Patient temperature: 37

## 2015-08-12 LAB — COMPREHENSIVE METABOLIC PANEL
ALT: 40 U/L (ref 14–54)
AST: 42 U/L — ABNORMAL HIGH (ref 15–41)
Albumin: 3.4 g/dL — ABNORMAL LOW (ref 3.5–5.0)
Alkaline Phosphatase: 70 U/L (ref 38–126)
Anion gap: 10 (ref 5–15)
BUN: 35 mg/dL — ABNORMAL HIGH (ref 6–20)
CHLORIDE: 101 mmol/L (ref 101–111)
CO2: 27 mmol/L (ref 22–32)
CREATININE: 2.73 mg/dL — AB (ref 0.44–1.00)
Calcium: 8.5 mg/dL — ABNORMAL LOW (ref 8.9–10.3)
GFR, EST AFRICAN AMERICAN: 21 mL/min — AB (ref >60)
GFR, EST NON AFRICAN AMERICAN: 18 mL/min — AB (ref >60)
Glucose, Bld: 124 mg/dL — ABNORMAL HIGH (ref 65–99)
POTASSIUM: 3.6 mmol/L (ref 3.5–5.1)
Sodium: 138 mmol/L (ref 135–145)
Total Bilirubin: 0.8 mg/dL (ref 0.3–1.2)
Total Protein: 6.7 g/dL (ref 6.5–8.1)

## 2015-08-12 LAB — GLUCOSE, CAPILLARY
GLUCOSE-CAPILLARY: 158 mg/dL — AB (ref 65–99)
Glucose-Capillary: 162 mg/dL — ABNORMAL HIGH (ref 65–99)
Glucose-Capillary: 181 mg/dL — ABNORMAL HIGH (ref 65–99)
Glucose-Capillary: 179 mg/dL — ABNORMAL HIGH (ref 65–99)
Glucose-Capillary: 160 mg/dL — ABNORMAL HIGH (ref 65–99)
Glucose-Capillary: 188 mg/dL — ABNORMAL HIGH (ref 65–99)
Glucose-Capillary: 310 mg/dL — ABNORMAL HIGH (ref 65–99)

## 2015-08-12 LAB — CBC
HCT: 36.9 % (ref 35.0–47.0)
Hemoglobin: 11.9 g/dL — ABNORMAL LOW (ref 12.0–16.0)
MCH: 28.8 pg (ref 26.0–34.0)
MCHC: 32.4 g/dL (ref 32.0–36.0)
MCV: 88.9 fL (ref 80.0–100.0)
PLATELETS: 222 10*3/uL (ref 150–440)
RBC: 4.15 MIL/uL (ref 3.80–5.20)
RDW: 13.6 % (ref 11.5–14.5)
WBC: 14.4 10*3/uL — ABNORMAL HIGH (ref 3.6–11.0)

## 2015-08-12 LAB — BRAIN NATRIURETIC PEPTIDE: B NATRIURETIC PEPTIDE 5: 23 pg/mL (ref 0.0–100.0)

## 2015-08-12 LAB — TSH: TSH: 2.035 u[IU]/mL (ref 0.350–4.500)

## 2015-08-12 LAB — AMMONIA: AMMONIA: 15 umol/L (ref 9–35)

## 2015-08-12 MED ORDER — LEVOFLOXACIN IN D5W 500 MG/100ML IV SOLN
500.0000 mg | INTRAVENOUS | Status: DC
  Administered 2015-08-12: 500 mg via INTRAVENOUS
  Filled 2015-08-12 (×2): qty 100

## 2015-08-12 MED ORDER — FUROSEMIDE 10 MG/ML IJ SOLN
40.0000 mg | Freq: Once | INTRAMUSCULAR | Status: AC
  Administered 2015-08-12: 40 mg via INTRAVENOUS
  Filled 2015-08-12: qty 4

## 2015-08-12 MED ORDER — PIPERACILLIN-TAZOBACTAM 3.375 G IVPB
3.3750 g | Freq: Three times a day (TID) | INTRAVENOUS | Status: DC
  Administered 2015-08-12 – 2015-08-13 (×3): 3.375 g via INTRAVENOUS
  Filled 2015-08-12 (×5): qty 50

## 2015-08-12 MED ORDER — IPRATROPIUM-ALBUTEROL 0.5-2.5 (3) MG/3ML IN SOLN
3.0000 mL | RESPIRATORY_TRACT | Status: DC
  Administered 2015-08-12 – 2015-08-13 (×8): 3 mL via RESPIRATORY_TRACT
  Filled 2015-08-12 (×8): qty 3

## 2015-08-12 MED ORDER — IPRATROPIUM-ALBUTEROL 0.5-2.5 (3) MG/3ML IN SOLN
RESPIRATORY_TRACT | Status: AC
  Administered 2015-08-12: 3 mL
  Filled 2015-08-12: qty 3

## 2015-08-12 MED ORDER — METHYLPREDNISOLONE SODIUM SUCC 125 MG IJ SOLR
60.0000 mg | INTRAMUSCULAR | Status: DC
  Administered 2015-08-12 – 2015-08-17 (×6): 60 mg via INTRAVENOUS
  Filled 2015-08-12 (×6): qty 2

## 2015-08-12 NOTE — Care Management Important Message (Signed)
Important Message  Patient Details  Name: [name] [name] MRN: [id number] Date of Birth: 11/07/1955   Medicare Important Message Given:  Yes-second notification given    Verita SchneidersKathy A Allmond 08/12/2015, 10:33 AM

## 2015-08-12 NOTE — Evaluation (Signed)
Physical Therapy Evaluation Patient Details Name: Jerre Diguglielmo MRN: [id number] DOB: Jan 02, 1956 Today's Date: 08/12/2015   History of Present Illness  Pt is a 59 yo female w. R AKA admitted s/p successful revascularization procedure of LLE.   Clinical Impression  Pt appears groggy upon arrival to room; A&O x 2 (person, time; pt thought that she was in [place]). Pt states that she typically is able to be independent with utilization of multiple assistive devices (crutches, walker, manual wheelchair) and with her RLE prostheses; pt had a hard time clearly describing her daily routine/ in what capacity she uses each device. Pt was able to demonstrate mod independence with bed mobility. Pt was mod assist with sit<>stand transfer; which according to her she is able to complete independently at home without difficulty. with or without her RLE prosthesis. Pt was able to maintain standing posture for ~1 minute the on the first attempt with moderate L knee buckling; second trial of standing pt was able to maintain better L knee extension with cueing. Pt ambulated x 10 ft w/ RW and min assist utilizing a hop-to pattern of gait. Pt appears to be weaker than her baseline and appears to be a fall risk based off of her performance with transfers and ambulation today. Would like to be able to reassess her transfers and ambulation with her RLE prosthesis in order to get a better assessment of her functional mobility compared to baseline. Current d/c recommendations could change pending any increase in safety and capacity for functional mobility w/ the prosthesis.     Follow Up Recommendations SNF    Equipment Recommendations       Recommendations for Other Services       Precautions / Restrictions Precautions Precautions: Fall Required Braces or Orthoses:  (pt has a prosthesis for RLE but does not have with her) Restrictions Weight Bearing Restrictions: No      Mobility  Bed Mobility Overal bed  mobility: Modified Independent                Transfers Overall transfer level: Needs assistance Equipment used: Rolling walker (2 wheeled) Transfers: Sit to/from Stand Sit to Stand: Mod assist         General transfer comment: pt requires cues for keeping L knee locked out to prevent buckling  Ambulation/Gait Ambulation/Gait assistance: Min assist Ambulation Distance (Feet): 10 Feet Assistive device: Rolling walker (2 wheeled)     Gait velocity interpretation: <1.8 ft/sec, indicative of risk for recurrent falls General Gait Details: Pt uses hop-to pattern where she progresses RW and uses BUE and LLE to push off and advance herself   Stairs            Wheelchair Mobility    Modified Rankin (Stroke Patients Only)       Balance Overall balance assessment: Needs assistance Sitting-balance support: No upper extremity supported;Feet supported Sitting balance-Leahy Scale: Good     Standing balance support: Bilateral upper extremity supported;During functional activity Standing balance-Leahy Scale: Fair                               Pertinent Vitals/Pain Pain Assessment: Faces Faces Pain Scale: Hurts even more Pain Location: LLE Pain Intervention(s): Repositioned;Monitored during session    Home Living Family/patient expects to be discharged to:: Private residence Living Arrangements: Children Available Help at Discharge:  (has a niece that may be able to help care for her PRN) Type of Home: Apartment  Home Access: Level entry     Home Layout: One level Home Equipment: Crutches;Walker - 2 wheels;Wheelchair - manual (son brings her wheelchair to her at night if she needs it)      Prior Function Level of Independence: Independent with assistive device(s)         Comments: pt utilizes crutches, RW and wheelchair for ambulation assistance at home. Does not drive.      Hand Dominance        Extremity/Trunk Assessment   Upper  Extremity Assessment: Overall WFL for tasks assessed (able to adequately support BW through BUEs when using RW; adequate ROM grossly; strength at least 4/5)           Lower Extremity Assessment: Generalized weakness (LLE generalized weakness: gross LLE strength 3+/5)         Communication   Communication: Expressive difficulties ([place] and is hard to understand)  Cognition Arousal/Alertness: Awake/alert Behavior During Therapy: WFL for tasks assessed/performed Overall Cognitive Status: Within Functional Limits for tasks assessed                      General Comments      Exercises        Assessment/Plan    PT Assessment Patient needs continued PT services  PT Diagnosis Difficulty walking;Abnormality of gait;Acute pain;Generalized weakness   PT Problem List Decreased strength;Decreased activity tolerance;Decreased balance;Decreased mobility;Pain  PT Treatment Interventions DME instruction;Gait training;Functional mobility training;Therapeutic activities;Therapeutic exercise;Balance training   PT Goals (Current goals can be found in the Care Plan section) Acute Rehab PT Goals Patient Stated Goal: to get rid of pain PT Goal Formulation: With patient Time For Goal Achievement: 08/26/15 Potential to Achieve Goals: Good    Frequency Min 2X/week   Barriers to discharge Decreased caregiver support      Co-evaluation               End of Session Equipment Utilized During Treatment: Gait belt Activity Tolerance: Patient tolerated treatment well Patient left: in chair;with call bell/phone within reach;with chair alarm set           Time: [id number] PT Time Calculation (min) (ACUTE ONLY): 32 min   Charges:         PT G Codes:        Briselda Naval,SPT 08/12/2015, 10:10 AM

## 2015-08-12 NOTE — Consult Note (Signed)
[facility] [place] at [facility]   PATIENT NAME: [name] [name]    MR#:  [id number]  DATE OF BIRTH:  [birth year]  DATE OF ADMISSION:  08/10/2015  PRIMARY CARE PHYSICIAN: Pcp Not In System   CONSULT REQUESTING/REFERRING PHYSICIAN: Dr. Gilda Crease  REASON FOR CONSULT: Acute respiratory failure  CHIEF COMPLAINT:  No chief complaint on file.   HISTORY OF PRESENT ILLNESS:  [name] [name]  is a 59 y.o. female with a known history of peripheral arterial disease, hypertension, diabetes, peripheral neuropathy admitted to the hospital for left lower extremity peripheral arterial disease procedure. Patient seems to have had an uneventful procedure. Was monitored in the ICU. She was transferred out onto the medical floor yesterday. She has been noticed to have worsening hypoxia and has been placed on oxygen. She has been receiving IV Dilaudid, scheduled OxyContin and as needed oxycodone. Patient presently is unable to contribute to history. She is extremely drowsy. Opens her eyes. Has audible wheezing. Old records reviewed. Chest x-ray done showed bilateral pneumonitis. Patient is afebrile.  PAST MEDICAL HISTORY:   Past Medical History  Diagnosis Date  . PVD (peripheral vascular disease) with claudication (HCC)   . HTN (hypertension)   . Diabetes mellitus (HCC)   . Peripheral neuropathy (HCC)     PAST SURGICAL HISTOIRY:   Past Surgical History  Procedure Laterality Date  . Esophagogastroduodenoscopy N/A 06/21/2013    Procedure: ESOPHAGOGASTRODUODENOSCOPY (EGD);  Surgeon: Barrie Folk, MD;  Location: [facility] ENDOSCOPY;  Service: Endoscopy;  Laterality: N/A;  . Amputation Right 06/22/2013    Procedure: AMPUTATION BELOW KNEE ;  Surgeon: Chuck Hint, MD;  Location: [facility] OR;  Service: Vascular;  Laterality: Right;  . Femoral-popliteal bypass graft Right 2013  . Thrombectomy      of right FPBG  . Peripheral vascular catheterization Left 08/10/2015    Procedure: Lower  Extremity Angiography;  Surgeon: Renford Dills, MD;  Location: [facility] INVASIVE CV LAB;  Service: Cardiovascular;  Laterality: Left;  . Peripheral vascular catheterization Right 08/10/2015    Procedure: Lower Extremity Intervention;  Surgeon: Renford Dills, MD;  Location: [facility] INVASIVE CV LAB;  Service: Cardiovascular;  Laterality: Right;    SOCIAL HISTORY:   Social History  Substance Use Topics  . Smoking status: Current Every Day Smoker -- 0.50 packs/day for 40 years    Types: Cigarettes  . Smokeless tobacco: Not on file  . Alcohol Use: No    FAMILY HISTORY:   Family History  Problem Relation Age of Onset  . Diabetes Sister   . Heart disease Mother   . Heart disease Father     DRUG ALLERGIES:  No Known Allergies  REVIEW OF SYSTEMS:   Review of Systems  Unable to perform ROS: mental acuity      MEDICATIONS AT HOME:   Prior to Admission medications   Medication Sig Start Date End Date Taking? Authorizing Provider  acetaminophen (TYLENOL) 325 MG tablet Take 1-2 tablets (325-650 mg total) by mouth every 4 (four) hours as needed. 07/11/13  Yes Evlyn Kanner Love, PA-C  allopurinol (ZYLOPRIM) 100 MG tablet Take 100 mg by mouth daily.   Yes Historical Provider, MD  amitriptyline (ELAVIL) 50 MG tablet Take 50 mg by mouth at bedtime.   Yes Historical Provider, MD  DULoxetine (CYMBALTA) 60 MG capsule Take 60 mg by mouth daily.   Yes Historical Provider, MD  Iron-FA-B Cmp-C-Biot-Probiotic (FUSION PLUS PO) Take 1 capsule by mouth every morning.   Yes Historical Provider,  MD  methocarbamol (ROBAXIN) 500 MG tablet Take 1 tablet (500 mg total) by mouth 4 (four) times daily. 07/11/13  Yes Evlyn Kanner Love, PA-C  OxyCODONE (OXYCONTIN) 20 mg T12A 12 hr tablet Take 1 tablet (20 mg total) by mouth every 12 (twelve) hours. 07/11/13  Yes Evlyn Kanner Love, PA-C  oxyCODONE (ROXICODONE) 15 MG immediate release tablet Take 1 tablet (15 mg total) by mouth every 6 (six) hours as needed. 07/11/13  Yes Evlyn Kanner Love, PA-C  pregabalin (LYRICA) 300 MG capsule Take 300 mg by mouth 2 (two) times daily.   Yes Historical Provider, MD  rosuvastatin (CRESTOR) 20 MG tablet Take 20 mg by mouth daily.   Yes Historical Provider, MD  saxagliptin HCl (ONGLYZA) 2.5 MG TABS tablet Take 2.5 mg by mouth daily.   Yes Historical Provider, MD  nicotine polacrilex (NICORETTE) 2 MG gum Take 1 each (2 mg total) by mouth as needed for smoking cessation. Patient not taking: Reported on 08/10/2015 07/01/13   Dorothea Ogle, MD  pantoprazole (PROTONIX) 40 MG tablet Take 1 tablet (40 mg total) by mouth 2 (two) times daily. Patient not taking: Reported on 08/10/2015 07/11/13   Evlyn Kanner Love, PA-C  senna-docusate (SENOKOT-S) 8.6-50 MG per tablet Take 2 tablets by mouth 2 (two) times daily. Patient not taking: Reported on 08/10/2015 07/11/13   Evlyn Kanner Love, PA-C  warfarin (COUMADIN) 2 MG tablet Take 1.5 tablets (3 mg total) by mouth daily. Take with supper Patient not taking: Reported on 08/10/2015 07/11/13   Evlyn Kanner Love, PA-C      VITAL SIGNS:  Blood pressure 100/60, pulse 87, temperature 97.8 F (36.6 C), temperature source Oral, resp. rate 17, height  (1.676 m), weight 76.658 kg (169 lb), SpO2 92 %.  PHYSICAL EXAMINATION:  GENERAL:  59 y.o.-year-old patient lying in the bed with rep acute distress. Critically ill EYES: Pupils equal, round, reactive to light and accommodation. No scleral icterus. Extraocular muscles intact.  HEENT: Head atraumatic, normocephalic. Oropharynx and nasopharynx clear.  NECK:  Supple, no jugular venous distention. No thyroid enlargement, no tenderness.  LUNGS: Bilateral wheezing and coarse breath sounds. Decreased air entry. CARDIOVASCULAR: S1, S2 normal. No murmurs, rubs, or gallops.  ABDOMEN: Soft, nontender, nondistended. Bowel sounds present. No organomegaly or mass.  EXTREMITIES: Right below-knee amputated. No edema. NEUROLOGIC: Cranial nerves II through XII are intact. Muscle strength  5/5 in all extremities. Sensation intact. Gait not checked.  PSYCHIATRIC: The patient is drowsy SKIN: No obvious rash, lesion, or ulcer.   LABORATORY PANEL:   CBC  Recent Labs Lab 08/12/15 1507  WBC 14.4*  HGB 11.9*  HCT 36.9  PLT 222   ------------------------------------------------------------------------------------------------------------------  Chemistries   Recent Labs Lab 08/11/15 0503  NA 142  K 3.6  CL 108  CO2 26  GLUCOSE 198*  BUN 19  CREATININE 1.19*  CALCIUM 8.4*   ------------------------------------------------------------------------------------------------------------------  Cardiac Enzymes No results for input(s): TROPONINI in the last 168 hours. ------------------------------------------------------------------------------------------------------------------  RADIOLOGY:  Dg Chest 2 View  08/12/2015  CLINICAL DATA:  Lethargic unresponsive, cough EXAM: CHEST  2 VIEW COMPARISON:  09/24/2013 FINDINGS: Mild cardiac enlargement similar to prior study. Moderate diffuse interstitial process. Discoid atelectasis right mid lung zone and left mid to lower lung zone. No pleural effusion. Vascular pattern appears normal. IMPRESSION: Moderate diffuse interstitial change which is new or increased when compared to prior studies. Cyst concerning for atypical pneumonia. Interstitial pulmonary edema is considered much less likely. Electronically Signed   By: Marcy Salvo  Rubner M.D.   On: 08/12/2015 12:53    EKG:   Orders placed or performed in visit on 12/17/14  . EKG 12-Lead    IMPRESSION AND PLAN:   * Bilateral pneumonitis. Possible aspiration Start Zosyn and Levaquin. Cultures.  * Acute respiratory failure Due to pneumonia. Wean oxygen as tolerated. Patient's chest x-ray does not suggest any significant edema but she does have crackles on examination. Will check a BNP. Give a dose of IV Lasix. Patient will need BiPAP if any worsening of respiratory  status.  * Acute encephalopathy Check ABG. Could be from IV Dilaudid. Patient does open her eyes and is protecting her airways.  * Peripheral arterial disease His post procedure by vascular surgery.  * Chronic pain syndrome Will decrease her pain medications. Stop IV Dilaudid. Due to encephalopathy and drowsiness.  * DVT prophylaxis   Patient is critically ill. May need BiPAP or transferred to stepdown.  All the records are reviewed and case discussed with Consulting provider. Management plans discussed with the patient, family and they are in agreement.  CODE STATUS: FULL  TOTAL CC TIME TAKING CARE OF THIS PATIENT: 45 minutes.    Milagros LollSudini, Williams Dietrick R M.D on 08/12/2015 at 3:49 PM  Between 7am to 6pm - Pager - [phone]  After 6pm go to [email or web address] - password [email or web address]  [facility]  Office  [phone]  CC: Primary care Physician: Pcp Not In System   Note: This dictation was prepared with Dragon dictation along with smaller phrase technology. Any transcriptional errors that result from this process are unintentional.

## 2015-08-12 NOTE — Progress Notes (Signed)
Pt is resting with BPAP on Alert and orientated. No signs of distress. Night shift nurse is aware of previous situation. Will continue to monitor.  Karsten RoLauren E Hobbs

## 2015-08-12 NOTE — Progress Notes (Signed)
There is a rt consult order. I checked on the pt. After giving her a duoneb treatment for wheezes. Her respirations were 4 per minute and she had a 4 to 5 sec expiratory phase with wheezes. The dr. Was called and bipap was started. She is tolerating bipap very well. Pt. Appears to be improving.

## 2015-08-12 NOTE — Progress Notes (Signed)
Pt became very lethargic after lunch. Pt was reassessed and VS were 100/60, respirations 9, pulse 98, O2 91. Pt was experiencing wheezing and crackles in the lower lobes while using abdominal muscles with every breath. Pt has not voided since 2300 08/11/2015, bladder scan showed 267ml.  MD notified and placed new orders for IV lasix 40 mg one time, BIPAP, and place pt on cardiac monitoring.   Karsten RoLauren E Hobbs

## 2015-08-12 NOTE — Care Management (Signed)
Kristen from PT requested family bring prothesis for patient to use during PT evaluations.  Contacted Gean BirchwoodDeborah Owens (listed as contact) who agrees to bring prothesis by room first thing in AM.

## 2015-08-12 NOTE — Progress Notes (Signed)
[facility]  Daily Progress Note   Subjective  - 2 Days Post-Op; s/p left  leg revascularization  patient still very lethargic, complains of cough and of pain all over.  Objective Filed Vitals:   08/12/15 1430 08/12/15 1505 08/12/15 1600 08/12/15 1658  BP:  100/60    Pulse:    100  Temp:      TempSrc:      Resp:   4 16  Height:      Weight:      SpO2: 93% 92% 94% 95%    Intake/Output Summary (Last 24 hours) at 08/12/15 1833 Last data filed at 08/12/15 1300  Gross per 24 hour  Intake    300 ml  Output    500 ml  Net   -200 ml    PULM  Normal effort , no use of accessory muscles CV  No JVD, RRR Abd      No distended, nontender VASC  Left foot hyperemic, 2+ bounding left DP pulse  Laboratory CBC    Component Value Date/Time   WBC 14.4* 08/12/2015 1507   WBC 12.7* 09/28/2013 0556   HGB 11.9* 08/12/2015 1507   HGB 11.5* 09/28/2013 0556   HCT 36.9 08/12/2015 1507   HCT 34.6* 09/28/2013 0556   PLT 222 08/12/2015 1507   PLT 265 09/28/2013 0556    BMET    Component Value Date/Time   NA 138 08/12/2015 1507   NA 137 09/28/2013 0556   K 3.6 08/12/2015 1507   K 3.0* 09/28/2013 0556   CL 101 08/12/2015 1507   CL 99 09/28/2013 0556   CO2 27 08/12/2015 1507   CO2 27 09/28/2013 0556   GLUCOSE 124* 08/12/2015 1507   GLUCOSE 105* 09/28/2013 0556   BUN 35* 08/12/2015 1507   BUN 19* 09/28/2013 0556   CREATININE 2.73* 08/12/2015 1507   CREATININE 1.47* 09/28/2013 0556   CALCIUM 8.5* 08/12/2015 1507   CALCIUM 9.0 09/28/2013 0556   GFRNONAA 18* 08/12/2015 1507   GFRNONAA 39* 09/28/2013 0556   GFRAA 21* 08/12/2015 1507   GFRAA 45* 09/28/2013 0556    Assessment/Planning: POD #2 s/p revascularization of left leg  Given cough and lethargy I will order a chest X-ray which came back with atelectasis and pneumonia  Zosyn started  Left leg doing well on plavix and ASA will get PT/OT consult  Up to chair and increase pulmonary  toilet    [name] [name], [name] [name]  08/12/2015, 6:33 PM

## 2015-08-12 NOTE — Care Management (Signed)
Discussed with Kristen PT.  Recommendation will be for STR.  Informed CSW Wilford Gristara [name]

## 2015-08-13 LAB — GLUCOSE, CAPILLARY
GLUCOSE-CAPILLARY: 163 mg/dL — AB (ref 65–99)
GLUCOSE-CAPILLARY: 310 mg/dL — AB (ref 65–99)
GLUCOSE-CAPILLARY: 363 mg/dL — AB (ref 65–99)
Glucose-Capillary: 353 mg/dL — ABNORMAL HIGH (ref 65–99)
Glucose-Capillary: 268 mg/dL — ABNORMAL HIGH (ref 65–99)
Glucose-Capillary: 254 mg/dL — ABNORMAL HIGH (ref 65–99)

## 2015-08-13 LAB — BASIC METABOLIC PANEL
ANION GAP: 9 (ref 5–15)
BUN: 50 mg/dL — ABNORMAL HIGH (ref 6–20)
CO2: 25 mmol/L (ref 22–32)
Calcium: 8.1 mg/dL — ABNORMAL LOW (ref 8.9–10.3)
Chloride: 102 mmol/L (ref 101–111)
Creatinine, Ser: 2.8 mg/dL — ABNORMAL HIGH (ref 0.44–1.00)
GFR calc Af Amer: 20 mL/min — ABNORMAL LOW (ref >60)
GFR calc non Af Amer: 17 mL/min — ABNORMAL LOW (ref >60)
GLUCOSE: 280 mg/dL — AB (ref 65–99)
POTASSIUM: 4 mmol/L (ref 3.5–5.1)
Sodium: 136 mmol/L (ref 135–145)

## 2015-08-13 MED ORDER — SODIUM CHLORIDE 0.9 % IV SOLN
INTRAVENOUS | Status: DC
Start: 2015-08-13 — End: 2015-08-14
  Administered 2015-08-13: 13:00:00 via INTRAVENOUS

## 2015-08-13 MED ORDER — LEVOFLOXACIN 500 MG PO TABS
250.0000 mg | ORAL_TABLET | Freq: Every day | ORAL | Status: DC
  Administered 2015-08-14 – 2015-08-17 (×4): 250 mg via ORAL
  Filled 2015-08-13 (×4): qty 1

## 2015-08-13 MED ORDER — ENOXAPARIN SODIUM 30 MG/0.3ML ~~LOC~~ SOLN
30.0000 mg | SUBCUTANEOUS | Status: DC
  Administered 2015-08-13: 30 mg via SUBCUTANEOUS
  Filled 2015-08-13: qty 0.3

## 2015-08-13 MED ORDER — IPRATROPIUM-ALBUTEROL 0.5-2.5 (3) MG/3ML IN SOLN: 3.0000 mL | RESPIRATORY_TRACT | Status: DC | PRN

## 2015-08-13 MED ORDER — LEVOFLOXACIN 500 MG PO TABS
500.0000 mg | ORAL_TABLET | Freq: Once | ORAL | Status: AC
  Administered 2015-08-13: 500 mg via ORAL
  Filled 2015-08-13: qty 1

## 2015-08-13 MED ORDER — LEVOFLOXACIN 500 MG PO TABS: 500.0000 mg | ORAL_TABLET | Freq: Every day | ORAL | Status: DC

## 2015-08-13 NOTE — Progress Notes (Signed)
Pt refusing to wear BPAP. O2 saturation is 94% on 1 liter. Will continue to observe and monitor pt.

## 2015-08-13 NOTE — Consult Note (Signed)
Date: 08/13/2015                  Patient Name:  [name] [name]  MRN: [id number]  DOB: 1956-08-30  Age / Sex: 59 y.o., female         PCP: Pcp Not In System. PCP is in [place]                  Service Requesting Consult: Internal medicine                 Reason for Consult: ARF            History of Present Illness: Patient is a 59 y.o. female with medical problems of peripheral vascular disease, HTN, DM with complications, peripheral neuropathy, h/o Rt AKA who was admitted to [facility] on 08/10/2015 for evaluation of Atherosclerotic occlusive disease bilateral lower extremities with rest pain  And was last seen in our office in 2014. Then she moved to [place]. At that time, her creatinine was 1.09, GFR greater than 60 This time, her admission creatinine is 1.19 She underwent left leg vascularization on October 18. 95 cc of IV contrast was used Subsequently on October 20, her labs showed a creatinine had increased to 2.73 Today's creatinine has further increased to 2.80 Patient is a current smoker and smokes about half pack per day    Medications: Outpatient medications: Prescriptions prior to admission  Medication Sig Dispense Refill Last Dose  . acetaminophen (TYLENOL) 325 MG tablet Take 1-2 tablets (325-650 mg total) by mouth every 4 (four) hours as needed.   Past Month at Unknown time  . allopurinol (ZYLOPRIM) 100 MG tablet Take 100 mg by mouth daily.   08/09/2015 at Unknown time  . amitriptyline (ELAVIL) 50 MG tablet Take 50 mg by mouth at bedtime.   08/09/2015 at Unknown time  . DULoxetine (CYMBALTA) 60 MG capsule Take 60 mg by mouth daily.   08/09/2015 at Unknown time  . Iron-FA-B Cmp-C-Biot-Probiotic (FUSION PLUS PO) Take 1 capsule by mouth every morning.   08/09/2015 at Unknown time  . methocarbamol (ROBAXIN) 500 MG tablet Take 1 tablet (500 mg total) by mouth 4 (four) times daily. 120 tablet 1 08/09/2015 at Unknown time  . OxyCODONE (OXYCONTIN) 20 mg T12A 12 hr tablet Take 1  tablet (20 mg total) by mouth every 12 (twelve) hours. 60 tablet 0 08/09/2015 at Unknown time  . oxyCODONE (ROXICODONE) 15 MG immediate release tablet Take 1 tablet (15 mg total) by mouth every 6 (six) hours as needed. 120 tablet 0 08/09/2015 at Unknown time  . pregabalin (LYRICA) 300 MG capsule Take 300 mg by mouth 2 (two) times daily.   08/09/2015 at Unknown time  . rosuvastatin (CRESTOR) 20 MG tablet Take 20 mg by mouth daily.   08/09/2015 at Unknown time  . saxagliptin HCl (ONGLYZA) 2.5 MG TABS tablet Take 2.5 mg by mouth daily.   08/09/2015 at Unknown time  . nicotine polacrilex (NICORETTE) 2 MG gum Take 1 each (2 mg total) by mouth as needed for smoking cessation. (Patient not taking: Reported on 08/10/2015) 100 tablet 0 Not Taking at Unknown time  . pantoprazole (PROTONIX) 40 MG tablet Take 1 tablet (40 mg total) by mouth 2 (two) times daily. (Patient not taking: Reported on 08/10/2015) 60 tablet 1 Not Taking at Unknown time  . senna-docusate (SENOKOT-S) 8.6-50 MG per tablet Take 2 tablets by mouth 2 (two) times daily. (Patient not taking: Reported on 08/10/2015)   Completed Course at  Unknown time  . warfarin (COUMADIN) 2 MG tablet Take 1.5 tablets (3 mg total) by mouth daily. Take with supper (Patient not taking: Reported on 08/10/2015) 45 tablet 1 Completed Course at Unknown time    Current medications: Current Facility-Administered Medications  Medication Dose Route Frequency Provider Last Rate Last Dose  . 0.9 %  sodium chloride infusion   Intravenous Continuous Enedina FinnerSona Patel, MD 100 mL/hr at 08/13/15 1243    . allopurinol (ZYLOPRIM) tablet 100 mg  100 mg Oral Daily Renford DillsGregory G Schnier, MD   100 mg at 08/13/15 0847  . amitriptyline (ELAVIL) tablet 50 mg  50 mg Oral QHS Renford DillsGregory G Schnier, MD   Stopped at 08/12/15 2326  . antiseptic oral rinse (CPC / CETYLPYRIDINIUM CHLORIDE 0.05%) solution 7 mL  7 mL Mouth Rinse BID Renford DillsGregory G Schnier, MD   Stopped at 08/13/15 1000  . clopidogrel (PLAVIX)  tablet 75 mg  75 mg Oral Daily Renford DillsGregory G Schnier, MD   75 mg at 08/13/15 0846  . DULoxetine (CYMBALTA) DR capsule 60 mg  60 mg Oral Daily Renford DillsGregory G Schnier, MD   60 mg at 08/13/15 0847  . enoxaparin (LOVENOX) injection 30 mg  30 mg Subcutaneous Q24H Enedina FinnerSona Patel, MD      . insulin aspart (novoLOG) injection 0-24 Units  0-24 Units Subcutaneous 6 times per day Tonette LedererKimberly A Stegmayer, PA-C   12 Units at 08/13/15 1237  . ipratropium-albuterol (DUONEB) 0.5-2.5 (3) MG/3ML nebulizer solution 3 mL  3 mL Nebulization Q4H Srikar Sudini, MD   3 mL at 08/13/15 1148  . [START ON 08/14/2015] levofloxacin (LEVAQUIN) tablet 250 mg  250 mg Oral Daily Enedina FinnerSona Patel, MD      . methocarbamol (ROBAXIN) tablet 500 mg  500 mg Oral QID Renford DillsGregory G Schnier, MD   500 mg at 08/13/15 0846  . methylPREDNISolone sodium succinate (SOLU-MEDROL) 125 mg/2 mL injection 60 mg  60 mg Intravenous Q24H Milagros LollSrikar Sudini, MD   60 mg at 08/12/15 1449  . oxyCODONE (Oxy IR/ROXICODONE) immediate release tablet 10 mg  10 mg Oral Q4H PRN Renford DillsGregory G Schnier, MD   10 mg at 08/13/15 1219  . pantoprazole (PROTONIX) EC tablet 40 mg  40 mg Oral BID Renford DillsGregory G Schnier, MD   40 mg at 08/13/15 0846  . pregabalin (LYRICA) capsule 300 mg  300 mg Oral BID Renford DillsGregory G Schnier, MD   300 mg at 08/13/15 0847  . rosuvastatin (CRESTOR) tablet 20 mg  20 mg Oral Daily Renford DillsGregory G Schnier, MD   20 mg at 08/13/15 0847  . senna-docusate (Senokot-S) tablet 2 tablet  2 tablet Oral BID Erin FullingKurian Kasa, MD   2 tablet at 08/13/15 0847      Allergies: No Known Allergies    Past Medical History: Past Medical History  Diagnosis Date  . PVD (peripheral vascular disease) with claudication (HCC)   . HTN (hypertension)   . Diabetes mellitus (HCC)   . Peripheral neuropathy [facility](HCC)      Past Surgical History: Past Surgical History  Procedure Laterality Date  . Esophagogastroduodenoscopy N/A 06/21/2013    Procedure: ESOPHAGOGASTRODUODENOSCOPY (EGD);  Surgeon: Barrie FolkJohn C Hayes, MD;  [facility];  Service: Endoscopy;  Laterality: N/A;  . Amputation Right 06/22/2013    Procedure: AMPUTATION BELOW KNEE ;  Surgeon: Chuck Hinthristopher S Dickson, MD;  [facility];  Service: Vascular;  Laterality: Right;  . Femoral-popliteal bypass graft Right 2013  . Thrombectomy      of right FPBG  . Peripheral  vascular catheterization Left 08/10/2015    Procedure: Lower Extremity Angiography;  Surgeon: Renford Dills, MD;  Location: [facility] INVASIVE CV LAB;  Service: Cardiovascular;  Laterality: Left;  . Peripheral vascular catheterization Right 08/10/2015    Procedure: Lower Extremity Intervention;  Surgeon: Renford Dills, MD;  Location: [facility] INVASIVE CV LAB;  Service: Cardiovascular;  Laterality: Right;     Family History: Family History  Problem Relation Age of Onset  . Diabetes Sister   . Heart disease Mother   . Heart disease Father      Social History: Social History   Social History  . Marital Status: Divorced    Spouse Name: N/A  . Number of Children: N/A  . Years of Education: N/A   Occupational History  . Not on file.   Social History Main Topics  . Smoking status: Current Every Day Smoker -- 0.50 packs/day for 40 years    Types: Cigarettes  . Smokeless tobacco: Not on file  . Alcohol Use: No  . Drug Use: No  . Sexual Activity: Not on file   Other Topics Concern  . Not on file   Social History Narrative     Review of Systems: Gen: No fevers or chills  HEENT: No complaints  CV: No complaints  Resp: Current smoker, wheezing, dyspnea on exertion  GI:No complaints  GU : No complaints  MS: Right above-the-knee amputation  Derm:  No complaints  Psych:No complaint  Heme: No complaints  Neuro: No complaints  Endocrine. No recent A1c available  Vital Signs: Blood pressure 90/52, pulse 90, temperature 98.4 F (36.9 C), temperature source Oral, resp. rate 14, height  (1.676 m), weight 76.658 kg (169 lb), SpO2 88 %.   Intake/Output Summary (Last  24 hours) at 08/13/15 1411 Last data filed at 08/13/15 1042  Gross per 24 hour  Intake    220 ml  Output   1000 ml  Net   -780 ml    Weight trends: [employer]   08/10/15 0919  Weight: 76.658 kg (169 lb)    Physical Exam: General:   no acute distress, laying in the bed  HEENT  moist mucous membranes, anicteric sclera  Neck:   supple, no masses  Lungs:  mild scattered wheezing, normal respiratory effort   Heart::   regular rhythm, no rub or gallop  Abdomen:  soft, nontender  Extremities:   right above-the-knee amputation  Neurologic:  alert, oriented  Skin:  no acute rashes  Access:   Foley:        Lab results: Basic Metabolic Panel:  Recent Labs Lab 08/11/15 0503 08/12/15 1507 08/13/15 1035  NA 142 138 136  K 3.6 3.6 4.0  CL 108 101 102  CO2 GLUCOSE 198* 124* 280*  BUN 19 35* 50*  CREATININE 1.19* 2.73* 2.80*  CALCIUM 8.4* 8.5* 8.1*    Liver Function Tests:  Recent Labs Lab 08/12/15 1507  AST 42*  ALT 40  ALKPHOS 70  BILITOT 0.8  PROT 6.7  ALBUMIN 3.4*   No results for input(s): LIPASE, AMYLASE in the last 168 hours.  Recent Labs Lab 08/12/15 1507  AMMONIA 15    CBC:  Recent Labs Lab 08/12/15 1507  WBC 14.4*  HGB 11.9*  HCT 36.9  MCV 88.9  PLT 222    Cardiac Enzymes: No results for input(s): CKTOTAL, TROPONINI in the last 168 hours.  BNP: Invalid input(s): POCBNP  CBG:  Recent Labs Lab 08/12/15 2019 08/12/15  2326 08/13/15 0308 08/13/15 0729 08/13/15 1125  GLUCAP 188* 310* 353* 268* 254*    Microbiology: Recent Results (from the past 720 hour(s))  MRSA PCR Screening     Status: None   Collection Time: 08/10/15  3:15 PM  Result Value Ref Range Status   MRSA by PCR NEGATIVE NEGATIVE Final    Comment:        The GeneXpert MRSA Assay (FDA approved for NASAL specimens only), is one component of a comprehensive MRSA colonization surveillance program. It is not intended to diagnose MRSA infection nor to  guide or monitor treatment for MRSA infections.      Coagulation Studies: No results for input(s): LABPROT, INR in the last 72 hours.  Urinalysis: No results for input(s): COLORURINE, LABSPEC, PHURINE, GLUCOSEU, HGBUR, BILIRUBINUR, KETONESUR, PROTEINUR, UROBILINOGEN, NITRITE, LEUKOCYTESUR in the last 72 hours.  Invalid input(s): APPERANCEUR    Imaging: Dg Chest 2 View  08/12/2015  CLINICAL DATA:  Lethargic unresponsive, cough EXAM: CHEST  2 VIEW COMPARISON:  09/24/2013 FINDINGS: Mild cardiac enlargement similar to prior study. Moderate diffuse interstitial process. Discoid atelectasis right mid lung zone and left mid to lower lung zone. No pleural effusion. Vascular pattern appears normal. IMPRESSION: Moderate diffuse interstitial change which is new or increased when compared to prior studies. Cyst concerning for atypical pneumonia. Interstitial pulmonary edema is considered much less likely. Electronically Signed   By: Esperanza Heir M.D.   On: 08/12/2015 12:53      Assessment & Plan: Patient is a 59 y.o. female with medical problems of peripheral vascular disease, HTN, DM with complications, peripheral neuropathy, h/o Rt AKA who was admitted to [facility] on 08/10/2015 for evaluation of Atherosclerotic occlusive disease bilateral lower extremities with rest pain   1. Acute renal failure. Likely secondary to IV contrast exposure on October 18. 95 cc of IV contrast was used Rate of rise of creatinine has slowed down Electrolytes and volume status are acceptable No acute indication for dialysis Continue low-dose IV fluids for maintenance 2. 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 Diabetes type 2 with CKD Obtain hemoglobin A1c 4. Current smoker Currently smokes half pack per day Counselled to quit smoking

## 2015-08-13 NOTE — Progress Notes (Signed)
Physical Therapy Treatment Patient Details Name: [name] [name] MRN: [id number] DOB: 09/25/1956 Today's Date: 08/13/2015    History of Present Illness Pt is a 59 yo female w. R AKA admitted s/p successful revascularization procedure of LLE.     PT Comments    Pt was sitting up on EOB upon arrival to room with O2 [place] off. Pt's SpO2% was measured and found to be 87%, pt's nurse notified and she ordered pt be placed on 2L of O2. Pt's SpO2% returned to low 90s. Pt was able to demonstrate increased proficiency with sit<>stand transfer x 5; only requiring min assist from PT with no instances of L knee buckling noted. Pt left in chair with chair alarm set. Due to pt's respiratory complications over night activity was limited during therapy today in order to avoid exacerbation.   Follow Up Recommendations  SNF     Equipment Recommendations  Rolling walker with 5" wheels    Recommendations for Other Services       Precautions / Restrictions Precautions Precautions: Fall Required Braces or Orthoses:  (RLE prosthesis) Restrictions Weight Bearing Restrictions: No    Mobility  Bed Mobility Overal bed mobility: Modified Independent                Transfers Overall transfer level: Needs assistance Equipment used: Rolling walker (2 wheeled) Transfers: Sit to/from [employer] CorporationStand;Stand Pivot Transfers Sit to Stand: Min assist Stand pivot transfers: Min assist       General transfer comment: pt did better with standing today, min assist with RUE assist on bed for boosting into standing. No instances of L knee buckling noted  Ambulation/Gait Ambulation/Gait assistance:  (no gait performed today )               Stairs            Wheelchair Mobility    Modified Rankin (Stroke Patients Only)       Balance Overall balance assessment: Needs assistance Sitting-balance support: No upper extremity supported;Feet supported Sitting balance-Leahy Scale: Good     Standing  balance support: Bilateral upper extremity supported;During functional activity Standing balance-Leahy Scale: Good                      Cognition Arousal/Alertness: Awake/alert Behavior During Therapy: WFL for tasks assessed/performed Overall Cognitive Status: Within Functional Limits for tasks assessed                      Exercises Other Exercises Other Exercises: sit<>stand x 5 with min assist and BUE support on RW: RUE provides assist with push off on bed, pt able to keep L knee in extension more effeciently with standing    General Comments        Pertinent Vitals/Pain Pain Assessment:  (pt did not rate pain but did request pain medication) Pain Location: LLE, all over Pain Intervention(s): Repositioned;Monitored during session (pain meds given post session)    Home Living                      Prior Function            PT Goals (current goals can now be found in the care plan section) Acute Rehab PT Goals Patient Stated Goal: to get rid of pain PT Goal Formulation: With patient Time For Goal Achievement: 08/13/15 Potential to Achieve Goals: Good Progress towards PT goals: Progressing toward goals    Frequency  Min 2X/week  PT Plan Current plan remains appropriate    Co-evaluation             End of Session Equipment Utilized During Treatment: Gait belt Activity Tolerance: Patient tolerated treatment well Patient left: in chair;with chair alarm set;with call bell/phone within reach;with nursing/sitter in room     Time: 1610-9604 PT Time Calculation (min) (ACUTE ONLY): 24 min  Charges:                       G CodesGeorgina Peer 08/13/2015, 1:14 PM

## 2015-08-13 NOTE — Consult Note (Signed)
Pulmonary Critical Care  Initial Consult Note   [name] [name] VHQ:[id number] DOB: 03/25/1956 DOA: 08/10/2015  Referring physician: Milagros Loll, MD PCP: Pcp Not In System   Chief Complaint: Respiratory Failure  HPI: [name] [name] is a 59 y.o. female with history of DM PVD HTN who was admitted to the hospital for vascular intervention. Patient did well and was sent to the floor. Patient had been noting more difficulty with her breathing by the staff. Patient also had been getting narcotics for pain control and appeared to be more drowsy. Patient had a CXR done which shows increased infiltrates concerning for pneumonia or fluid overload. Patient was started on BIPAP as the ABG was showing an increased pCO2 and hypoxia. She is now more alert and awake.    Review of Systems:  Complete ROS was performed and is unremarkable other than HPI  Past Medical History  Diagnosis Date  . PVD (peripheral vascular disease) with claudication (HCC)   . HTN (hypertension)   . Diabetes mellitus (HCC)   . Peripheral neuropathy [facility])    Past Surgical History  Procedure Laterality Date  . Esophagogastroduodenoscopy N/A 06/21/2013    Procedure: ESOPHAGOGASTRODUODENOSCOPY (EGD);  Surgeon: Barrie Folk, MD;  Location: [facility] ENDOSCOPY;  Service: Endoscopy;  Laterality: N/A;  . Amputation Right 06/22/2013    Procedure: AMPUTATION BELOW KNEE ;  Surgeon: Chuck Hint, MD;  Location: [facility] OR;  Service: Vascular;  Laterality: Right;  . Femoral-popliteal bypass graft Right 2013  . Thrombectomy      of right FPBG  . Peripheral vascular catheterization Left 08/10/2015    Procedure: Lower Extremity Angiography;  Surgeon: Renford Dills, MD;  Location: [facility] INVASIVE CV LAB;  Service: Cardiovascular;  Laterality: Left;  . Peripheral vascular catheterization Right 08/10/2015    Procedure: Lower Extremity Intervention;  Surgeon: Renford Dills, MD;  Location: [facility] INVASIVE CV LAB;  Service: Cardiovascular;   Laterality: Right;   Social History:  reports that she has been smoking Cigarettes.  She has a 20 pack-year smoking history. She does not have any smokeless tobacco history on file. She reports that she does not drink alcohol or use illicit drugs.  No Known Allergies  Family History  Problem Relation Age of Onset  . Diabetes Sister   . Heart disease Mother   . Heart disease Father     Prior to Admission medications   Medication Sig Start Date End Date Taking? Authorizing Provider  acetaminophen (TYLENOL) 325 MG tablet Take 1-2 tablets (325-650 mg total) by mouth every 4 (four) hours as needed. 07/11/13  Yes Evlyn Kanner Love, PA-C  allopurinol (ZYLOPRIM) 100 MG tablet Take 100 mg by mouth daily.   Yes Historical Provider, MD  amitriptyline (ELAVIL) 50 MG tablet Take 50 mg by mouth at bedtime.   Yes Historical Provider, MD  DULoxetine (CYMBALTA) 60 MG capsule Take 60 mg by mouth daily.   Yes Historical Provider, MD  Iron-FA-B Cmp-C-Biot-Probiotic (FUSION PLUS PO) Take 1 capsule by mouth every morning.   Yes Historical Provider, MD  methocarbamol (ROBAXIN) 500 MG tablet Take 1 tablet (500 mg total) by mouth 4 (four) times daily. 07/11/13  Yes Evlyn Kanner Love, PA-C  OxyCODONE (OXYCONTIN) 20 mg T12A 12 hr tablet Take 1 tablet (20 mg total) by mouth every 12 (twelve) hours. 07/11/13  Yes Evlyn Kanner Love, PA-C  oxyCODONE (ROXICODONE) 15 MG immediate release tablet Take 1 tablet (15 mg total) by mouth every 6 (six) hours as needed. 07/11/13  Yes Evlyn Kanner  Love, PA-C  pregabalin (LYRICA) 300 MG capsule Take 300 mg by mouth 2 (two) times daily.   Yes Historical Provider, MD  rosuvastatin (CRESTOR) 20 MG tablet Take 20 mg by mouth daily.   Yes Historical Provider, MD  saxagliptin HCl (ONGLYZA) 2.5 MG TABS tablet Take 2.5 mg by mouth daily.   Yes Historical Provider, MD  nicotine polacrilex (NICORETTE) 2 MG gum Take 1 each (2 mg total) by mouth as needed for smoking cessation. Patient not taking: Reported on  08/10/2015 07/01/13   Dorothea Ogle, MD  pantoprazole (PROTONIX) 40 MG tablet Take 1 tablet (40 mg total) by mouth 2 (two) times daily. Patient not taking: Reported on 08/10/2015 07/11/13   Evlyn Kanner Love, PA-C  senna-docusate (SENOKOT-S) 8.6-50 MG per tablet Take 2 tablets by mouth 2 (two) times daily. Patient not taking: Reported on 08/10/2015 07/11/13   Evlyn Kanner Love, PA-C  warfarin (COUMADIN) 2 MG tablet Take 1.5 tablets (3 mg total) by mouth daily. Take with supper Patient not taking: Reported on 08/10/2015 07/11/13   Jacquelynn Cree, PA-C   Physical Exam: Filed Vitals:   08/13/15 1238 08/13/15 1300 08/13/15 1457 08/13/15 1557  BP:   105/67   Pulse:   85   Temp:   97.9 F (36.6 C)   TempSrc:   Oral   Resp:   17   Height:      Weight:      SpO2: 92% 88% 92% 96%    Wt Readings from Last 3 Encounters:  08/10/15 76.658 kg (169 lb)  07/11/13 71.7 kg (158 lb 1.1 oz)  07/01/13 72 kg (158 lb 11.7 oz)    General:  Appears calm and comfortable Eyes: PERRL, normal lids, irises & conjunctiva ENT: grossly normal hearing, lips & tongue Neck: no LAD, masses or thyromegaly Cardiovascular: RRR, no m/r/g. Respiratory: few crackles. Normal respiratory effort. Abdomen: soft, nontender Skin: no rash or induration seen on limited exam Musculoskeletal: grossly normal tone BUE has right AKA Psychiatric: grossly normal mood and affect Neurologic: grossly non-focal.          Labs on Admission:  Basic Metabolic Panel:  Recent Labs Lab 08/10/15 0922 08/11/15 0503 08/12/15 1507 08/13/15 1035  NA 141 142 138 136  K 3.6 3.6 3.6 4.0  CL 107 108 101 102  CO2 GLUCOSE 168* 198* 124* 280*  BUN 16 19 35* 50*  CREATININE 1.27* 1.19* 2.73* 2.80*  CALCIUM 9.4 8.4* 8.5* 8.1*   Liver Function Tests:  Recent Labs Lab 08/12/15 1507  AST 42*  ALT 40  ALKPHOS 70  BILITOT 0.8  PROT 6.7  ALBUMIN 3.4*   No results for input(s): LIPASE, AMYLASE in the last 168 hours.  Recent  Labs Lab 08/12/15 1507  AMMONIA 15   CBC:  Recent Labs Lab 08/12/15 1507  WBC 14.4*  HGB 11.9*  HCT 36.9  MCV 88.9  PLT 222   Cardiac Enzymes: No results for input(s): CKTOTAL, CKMB, CKMBINDEX, TROPONINI in the last 168 hours.  BNP (last 3 results)  Recent Labs  08/12/15 1507  BNP 23.0    ProBNP (last 3 results) No results for input(s): PROBNP in the last 8760 hours.  CBG:  Recent Labs Lab 08/12/15 2326 08/13/15 0308 08/13/15 0729 08/13/15 1125 08/13/15 1624  GLUCAP 310* 353* 268* 254* 163*    Radiological Exams on Admission: Dg Chest 2 View  08/12/2015  CLINICAL DATA:  Lethargic unresponsive, cough EXAM: CHEST  2 VIEW  COMPARISON:  09/24/2013 FINDINGS: Mild cardiac enlargement similar to prior study. Moderate diffuse interstitial process. Discoid atelectasis right mid lung zone and left mid to lower lung zone. No pleural effusion. Vascular pattern appears normal. IMPRESSION: Moderate diffuse interstitial change which is new or increased when compared to prior studies. Cyst concerning for atypical pneumonia. Interstitial pulmonary edema is considered much less likely. Electronically Signed   By: Esperanza Heiraymond  Rubner M.D.   On: 08/12/2015 12:53      EKG: Independently reviewed.  Assessment/Plan Active Problems:   Ischemic leg--R BKA   Pneumonitis   Acute respiratory failure with hypoxia (HCC)   1. Acute Hypoxic respiratory failure -titrate oxygen as needed -continue with BIPAP as needed (off presently) -follow up ABG as needed -?may be having some hypoventilation related to narcotics -also possibly fluid overloaded agree with lasix  2. Bilateral Pneumonia -patient started on zosyn and Vanc -cultures have been ordered -f/u CXR in am  Code Status: full code (must indicate code status--if unknown or must be presumed, indicate so)    I have personally obtained a history, examined the patient, evaluated laboratory and imaging results, formulated the  assessment and plan and placed orders.  The Patient requires high complexity decision making for assessment and support.    Yevonne PaxSaadat A Stephanie Mcglone, MD [facility] Pulmonary Critical Care Medicine Sleep Medicine

## 2015-08-13 NOTE — Clinical Social Work Note (Signed)
Clinical Social Work Assessment  Patient Details  Name: [name] [name] MRN: [id number] Date of Birth: 11/04/1955  Date of referral:  08/12/15               Reason for consult:  Facility Placement, Discharge Planning                Permission sought to share information with:  Case Manager, Family Supports Permission granted to share information::  Yes, Verbal Permission Granted  Name::        Agency::     Relationship::  sisters as needed  Contact Information:     Housing/Transportation Living arrangements for the past 2 months:  Apartment Source of Information:  Patient, Medical Team, Case Manager Patient Interpreter Needed:  None Criminal Activity/Legal Involvement Pertinent to Current Situation/Hospitalization:  No - Comment as needed Significant Relationships:  Adult Children, Siblings Lives with:  Adult Children, Other (Comment) (daughter and granddaughter) Do you feel safe going back to the place where you live?  Yes Need for family participation in patient care:  Yes (Comment)  Care giving concerns: Pt lives in a one-bedroom apartment with her daughter and 421 years old granddaughter   Office managerocial Worker assessment / plan: CSW was referred to Pt to assist with dc planning. Pt is divorced, has 5 siblings, several children and grandchildren. Pt is disabled, she has had amputation prior to this admission. Pt reports going to SNF for 6 months at [facility] before finally getting her own apartment. Pt states that she is about to "put out" her daughter from staying at the apartment with her. Pt states that she wants to return to her home at dc and that she is open to home health if needed but that she does not want to return to SNF. CSW advised that it would be a few weeks stay at the most, not a 6 mos stay like the last time she went to SNF. Pt still hesitant and then disclosed that she does not want her family to be in the apartment without her there. CSW agreed to follow up with RN  CM to see [facility].   Employment status:  Disabled (Comment on whether or not currently receiving Disability) Insurance information:  Teacher, English as a foreign languageManaged Medicare, [place]:  Skilled Nursing Facility Information / Referral to community resources:  Skilled Nursing Facility  Patient/Family's Response to care:  Pt pleased with care in hospital. She is anxious to get back to her apartment and states that she feels "fine" even though she is being treated for PNA too.   Patient/Family's Understanding of and Emotional Response to Diagnosis, Current Treatment, and Prognosis:  No family at bedside. Pt has supportive sisters who live near her in [place]. Pt engages well with tx team.   Emotional Assessment Appearance:  Appears stated age Attitude/Demeanor/Rapport:  Apprehensive Affect (typically observed):  Apprehensive, Appropriate, Calm Orientation:  Oriented to Self, Oriented to Place, Oriented to  Time, Oriented to Situation Alcohol / Substance use:  Tobacco Use Psych involvement (Current and /or in the community):  No (Comment)  Discharge Needs  Concerns to be addressed:  Discharge Planning Concerns Readmission within the last 30 days:  No Current discharge risk:  Dependent with Mobility Barriers to Discharge:  Continued Medical Work up   [employer]ryker Corporationara N Santrice Muzio, LCSW 08/13/2015, 11:34 PM

## 2015-08-13 NOTE — Progress Notes (Signed)
Inpatient Diabetes Program Recommendations  AACE/ADA: New Consensus Statement on Inpatient Glycemic Control (2015)  Target Ranges:  Prepandial:   less than 140 mg/dL      Peak postprandial:   less than 180 mg/dL (1-2 hours)      Critically ill patients:  140 - 180 mg/dL   Review of Glycemic Control  Results for [name] DecampORAIN, [name] (MRN [id number]) as of 08/13/2015 12:53  Ref. Range 08/12/2015 20:19 08/12/2015 23:26 08/13/2015 03:08 08/13/2015 07:29 08/13/2015 11:25  Glucose-Capillary Latest Ref Range: 65-99 mg/dL 409188 (H) 811310 (H) 914353 (H) 268 (H) 254 (H)    Diabetes history: Type 2 Outpatient Diabetes medications: Onglyza 2.5mg /day Current orders for Inpatient glycemic control: Novolog insulin 0-24 units q4h   Inpatient Diabetes Program Recommendations: Poorly controlled blood sugars despite Novolog 0-24 units q4h.  Please consider starting Lantus 15 units (0.2units/kg) qday- will likely need to increase to 0.3units/kg.  Once steroids are tapered and stopped, insulin will need to be decreased as well.   Susette RacerJulie Lorian Yaun, RN, BA, MHA, CDE Diabetes Coordinator Inpatient Diabetes Program  [phone] (Team Pager) [phone] [facility] Office) 08/13/2015 12:58 PM

## 2015-08-13 NOTE — Progress Notes (Signed)
 Vein and Vascular Surgery  Daily Progress Note   Subjective  - 3 Days Post-Op; s/p left  leg revascularization  Much more alert still having diffuse pain but given her past history this is expected she notes her cough is much better and she is breathing easier  Objective Filed Vitals:   08/13/15 1238 08/13/15 1300 08/13/15 1457 08/13/15 1557  BP:   105/67   Pulse:   85   Temp:   97.9 F (36.6 C)   TempSrc:   Oral   Resp:   17   Height:      Weight:      SpO2: 92% 88% 92% 96%    Intake/Output Summary (Last 24 hours) at 08/13/15 1701 Last data filed at 08/13/15 1513  Gross per 24 hour  Intake    340 ml  Output   1200 ml  Net   -860 ml    PULM  Normal effort , no use of accessory muscles CV  No JVD, RRR Abd      No distended, nontender VASC  Left foot hyperemic, 2+ bounding left DP pulse  Laboratory CBC    Component Value Date/Time   WBC 14.4* 08/12/2015 1507   WBC 12.7* 09/28/2013 0556   HGB 11.9* 08/12/2015 1507   HGB 11.5* 09/28/2013 0556   HCT 36.9 08/12/2015 1507   HCT 34.6* 09/28/2013 0556   PLT 222 08/12/2015 1507   PLT 265 09/28/2013 0556    BMET    Component Value Date/Time   NA 136 08/13/2015 1035   NA 137 09/28/2013 0556   K 4.0 08/13/2015 1035   K 3.0* 09/28/2013 0556   CL 102 08/13/2015 1035   CL 99 09/28/2013 0556   CO2 25 08/13/2015 1035   CO2 27 09/28/2013 0556   GLUCOSE 280* 08/13/2015 1035   GLUCOSE 105* 09/28/2013 0556   BUN 50* 08/13/2015 1035   BUN 19* 09/28/2013 0556   CREATININE 2.80* 08/13/2015 1035   CREATININE 1.47* 09/28/2013 0556   CALCIUM 8.1* 08/13/2015 1035   CALCIUM 9.0 09/28/2013 0556   GFRNONAA 17* 08/13/2015 1035   GFRNONAA 39* 09/28/2013 0556   GFRAA 20* 08/13/2015 1035   GFRAA 45* 09/28/2013 0556    Assessment/Planning: POD #3 s/p revascularization of left leg  Pneumonia:  patient has been changed to Levaquin medicine is following she is clearly significantly improved  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  She is on plavix and ASA; PT/OT on consult  Up to chair and increase pulmonary toilet  Acute renal failure: This could be multifactorial including contrast exposure as well as dehydration and the face of pneumonia. Nephrology is been asked to see her she will remain hydrated nephrotoxic drugs medications will be avoided nephrology help is greatly appreciated    Renford DillsSchnier, Derrik Mceachern G  08/13/2015, 5:01 PM

## 2015-08-13 NOTE — Care Management (Addendum)
Spoke with patient for discharge planning at CSW request. PT recommended SNF but patient is not interested in SNF. Stated that she would like to have home health "aid" come to house. Patient is agreeable to having home health RN and PT make periodic visits. She is from home and stated that her adult daughter lives with her. Patient has a walker and a wheelchair at home. Also has prosthetic leg. Stated that she does well at home. Has no preference of Home Health providers. [facility]  cannot take , contacted Tamala BariMary Manley at [facility] who is able to provide services. [phone]

## 2015-08-13 NOTE — Progress Notes (Addendum)
Patient A/O, no noted distress. Denies pain. Held elavil resulted to BP. She tolerated other meds. Apprx 2200 patient awaken and wanted the Bipap off here sat 96%. Bipap was administered apprx midnight when the patient was sleep. When patient awaken again, she removed the bipap. There was noted respiratory distress. Patient not voided. Bladder scan noted 433. Offered to take to bathroom wanted to use bedpan, instead. Patient bath self without any asssist. After sitting her on BSC voided 600. Staff will continue to monitor and meet needs.

## 2015-08-13 NOTE — Progress Notes (Signed)
[facility] [place] [facility]   PATIENT NAME: [name] [name]    MR#:  [id number]  DATE OF BIRTH:  06/25/1956  SUBJECTIVE:  Sob improving  REVIEW OF SYSTEMS:   Review of Systems  Constitutional: Negative for fever, chills and weight loss.  HENT: Negative for ear discharge, ear pain and nosebleeds.   Eyes: Negative for blurred vision, pain and discharge.  Respiratory: Positive for shortness of breath. Negative for sputum production, wheezing and stridor.   Cardiovascular: Negative for chest pain, palpitations, orthopnea and PND.  Gastrointestinal: Negative for nausea, vomiting, abdominal pain and diarrhea.  Genitourinary: Negative for urgency and frequency.  Musculoskeletal: Negative for back pain and joint pain.  Neurological: Negative for sensory change, speech change, focal weakness and weakness.  Psychiatric/Behavioral: Negative for depression and hallucinations. The patient is not nervous/anxious.   All other systems reviewed and are negative.  Tolerating Diet:yes Tolerating PT: pending  DRUG ALLERGIES:  No Known Allergies  VITALS:  Blood pressure 90/52, pulse 90, temperature 98.4 F (36.9 C), temperature source Oral, resp. rate 14, height 5\' 6"  (1.676 m), weight 76.658 kg (169 lb), SpO2 95 %.  PHYSICAL EXAMINATION:   Physical Exam  GENERAL:  59 y.o.-year-old patient lying in the bed with no acute distress.  EYES: Pupils equal, round, reactive to light and accommodation. No scleral icterus. Extraocular muscles intact.  HEENT: Head atraumatic, normocephalic. Oropharynx and nasopharynx clear.  NECK:  Supple, no jugular venous distention. No thyroid enlargement, no tenderness.  LUNGS: Normal breath sounds bilaterally, no wheezing, rales, rhonchi. No use of accessory muscles of respiration.  CARDIOVASCULAR: S1, S2 normal. No murmurs, rubs, or gallops.  ABDOMEN: Soft, nontender, nondistended. Bowel sounds present. No organomegaly or mass.   EXTREMITIES: No cyanosis, clubbing or edema b/l.   Right BKA,,chronic NEUROLOGIC: Cranial nerves II through XII are intact. No focal Motor or sensory deficits b/l.   PSYCHIATRIC: The patient is alert and oriented x 3.  SKIN: No obvious rash, lesion, or ulcer.   LABORATORY PANEL:   CBC  Recent Labs Lab 08/12/15 1507  WBC 14.4*  HGB 11.9*  HCT 36.9  PLT 222   Chemistries   Recent Labs Lab 08/12/15 1507 08/13/15 1035  NA 138 136  K 3.6 4.0  CL 101 102  CO2 27 25  GLUCOSE 124* 280*  BUN 35* 50*  CREATININE 2.73* 2.80*  CALCIUM 8.5* 8.1*  AST 42*  --   ALT 40  --   ALKPHOS 70  --   BILITOT 0.8  --     Cardiac Enzymes No results for input(s): TROPONINI in the last 168 hours.  RADIOLOGY:  Dg Chest 2 View  08/12/2015  CLINICAL DATA:  Lethargic unresponsive, cough EXAM: CHEST  2 VIEW COMPARISON:  09/24/2013 FINDINGS: Mild cardiac enlargement similar to prior study. Moderate diffuse interstitial process. Discoid atelectasis right mid lung zone and left mid to lower lung zone. No pleural effusion. Vascular pattern appears normal. IMPRESSION: Moderate diffuse interstitial change which is new or increased when compared to prior studies. Cyst concerning for atypical pneumonia. Interstitial pulmonary edema is considered much less likely. Electronically Signed   By: Esperanza Heiraymond  Rubner M.D.   On: 08/12/2015 12:53   ASSESSMENT AND PLAN:   [name] [name] is a 59 y.o. female with a known history of peripheral arterial disease, hypertension, diabetes, peripheral neuropathy admitted to the hospital for left lower extremity peripheral arterial disease procedure  Bilateral pneumonitis with possible COPD flare po Levaquin. Wbc mildly elevated  *  Acute respiratory failure Due to pneumonia. Wean oxygen as tolerated. Patient's chest x-ray does not suggest any significant edema but she does have crackles on examination.  -recieved a dose of IV Lasix yday -nebs prn  * Acute renal  failure ?contrast nephropathy Baseline creatinine is 0.9-1.15 Came i wih creat of 1.73--->2.73--->2.80 Spoke with dr Thedore Mins to see pt  * Peripheral arterial disease Pt is  post procedure by vascular surgery for left LE s/p angioplasty and stenting   * Chronic pain syndrome Resume home meds  * DVT prophylaxis lovenox (renal dose)   Case discussed with Care Management/Social Worker. Management plans discussed with the patient, family and they are in agreemet CODE STATUS: full  DVT Prophylaxis: lovenox  TOTAL TIME TAKING CARE OF THIS PATIENT: 35 minutes.  >50% time spent on counselling and coordination of care pt and family  POSSIBLE D/C IN 1-2DAYS, DEPENDING ON CLINICAL CONDITION.   [name] [name] M.D on 08/13/2015 at 11:57 AM  Between 7am to 6pm - Pager - [phone]  After 6pm go to [email or web address] - password [email or web address]  [facility]  Office  [phone]  CC: Primary care physician; Pcp Not In System

## 2015-08-14 ENCOUNTER — Inpatient Hospital Stay: Payer: Medicare Other

## 2015-08-14 LAB — GLUCOSE, CAPILLARY
GLUCOSE-CAPILLARY: 126 mg/dL — AB (ref 65–99)
GLUCOSE-CAPILLARY: 181 mg/dL — AB (ref 65–99)
GLUCOSE-CAPILLARY: 217 mg/dL — AB (ref 65–99)
GLUCOSE-CAPILLARY: 240 mg/dL — AB (ref 65–99)
GLUCOSE-CAPILLARY: 340 mg/dL — AB (ref 65–99)
Glucose-Capillary: 150 mg/dL — ABNORMAL HIGH (ref 65–99)

## 2015-08-14 LAB — BASIC METABOLIC PANEL
Anion gap: 7 (ref 5–15)
BUN: 47 mg/dL — ABNORMAL HIGH (ref 6–20)
CALCIUM: 8.2 mg/dL — AB (ref 8.9–10.3)
CO2: 24 mmol/L (ref 22–32)
CREATININE: 1.85 mg/dL — AB (ref 0.44–1.00)
Chloride: 107 mmol/L (ref 101–111)
GFR calc non Af Amer: 29 mL/min — ABNORMAL LOW (ref >60)
GFR, EST AFRICAN AMERICAN: 33 mL/min — AB (ref >60)
GLUCOSE: 210 mg/dL — AB (ref 65–99)
Potassium: 4 mmol/L (ref 3.5–5.1)
Sodium: 138 mmol/L (ref 135–145)

## 2015-08-14 LAB — CREATININE, SERUM
Creatinine, Ser: 1.72 mg/dL — ABNORMAL HIGH (ref 0.44–1.00)
GFR calc non Af Amer: 31 mL/min — ABNORMAL LOW (ref >60)
GFR, EST AFRICAN AMERICAN: 36 mL/min — AB (ref >60)

## 2015-08-14 MED ORDER — ENOXAPARIN SODIUM 40 MG/0.4ML ~~LOC~~ SOLN
40.0000 mg | SUBCUTANEOUS | Status: DC
  Administered 2015-08-14 – 2015-08-16 (×3): 40 mg via SUBCUTANEOUS
  Filled 2015-08-14 (×3): qty 0.4

## 2015-08-14 NOTE — Progress Notes (Signed)
Subjective  - POD #4  No acute events Breathing better C/o pain in left heel   Physical Exam:  palpable left DP pulse CV:  RRR Pulm:  Breathing non-labored      Assessment/Plan:  POD #4  Pulm:  On Abx. Breathing better PAD:  Palpable pulse.  Rest pain resolved Activity:  Mobilize ARF:  Labs pending this am.  Renal following  [name] [name], [name] [name] 08/14/2015 10:34 AM --  [name] MonsFiled Vitals:   08/14/15 1006  BP:   Pulse: 75  Temp:   Resp:     Intake/Output Summary (Last 24 hours) at 08/14/15 1034 Last data filed at 08/14/15 0700  Gross per 24 hour  Intake 931.83 ml  Output   2400 ml  Net -1468.17 ml     Laboratory CBC    Component Value Date/Time   WBC 14.4* 08/12/2015 1507   WBC 12.7* 09/28/2013 0556   HGB 11.9* 08/12/2015 1507   HGB 11.5* 09/28/2013 0556   HCT 36.9 08/12/2015 1507   HCT 34.6* 09/28/2013 0556   PLT 222 08/12/2015 1507   PLT 265 09/28/2013 0556    BMET    Component Value Date/Time   NA 136 08/13/2015 1035   NA 137 09/28/2013 0556   K 4.0 08/13/2015 1035   K 3.0* 09/28/2013 0556   CL 102 08/13/2015 1035   CL 99 09/28/2013 0556   CO2 25 08/13/2015 1035   CO2 27 09/28/2013 0556   GLUCOSE 280* 08/13/2015 1035   GLUCOSE 105* 09/28/2013 0556   BUN 50* 08/13/2015 1035   BUN 19* 09/28/2013 0556   CREATININE 1.72* 08/14/2015 0552   CREATININE 1.47* 09/28/2013 0556   CALCIUM 8.1* 08/13/2015 1035   CALCIUM 9.0 09/28/2013 0556   GFRNONAA 31* 08/14/2015 0552   GFRNONAA 39* 09/28/2013 0556   GFRAA 36* 08/14/2015 0552   GFRAA 45* 09/28/2013 0556    COAG Lab Results  Component Value Date   INR 1.2 09/23/2013   INR 1.0 09/15/2013   INR 1.4 07/24/2013   No results found for: PTT  Antibiotics Anti-infectives    Start     Dose/Rate Route Frequency Ordered Stop   08/14/15 1000  levofloxacin (LEVAQUIN) tablet 250 mg     250 mg Oral Daily 08/13/15 1021     08/13/15 1030  levofloxacin (LEVAQUIN) tablet 500 mg  Status:  Discontinued       500 mg Oral Daily 08/13/15 1017 08/13/15 1020   08/13/15 1030  levofloxacin (LEVAQUIN) tablet 500 mg     500 mg Oral  Once 08/13/15 1021 08/13/15 1238   08/12/15 1430  levofloxacin (LEVAQUIN) IVPB 500 mg  Status:  Discontinued     500 mg 100 mL/hr over 60 Minutes Intravenous Every 24 hours 08/12/15 1422 08/13/15 1017   08/12/15 1400  piperacillin-tazobactam (ZOSYN) IVPB 3.375 g  Status:  Discontinued     3.375 g 12.5 mL/hr over 240 Minutes Intravenous Every 8 hours 08/12/15 1340 08/13/15 1017   08/10/15 0946  dextrose 5 % with cefUROXime (ZINACEF) ADS Med    Comments:  [name] [name]: cabinet override      08/10/15 0946 08/10/15 1038   08/10/15 0903  cefUROXime (ZINACEF) 1.5 g in dextrose 5 % 50 mL IVPB  Status:  Discontinued     1.5 g 100 mL/hr over 30 Minutes Intravenous 30 min pre-op 08/10/15 0903 08/10/15 1350       V. [name] [name] IV, M.D. [facility] Office: [phone] Pager:  [phone]  

## 2015-08-14 NOTE — Progress Notes (Signed)
Subjective:   Today, she is doing better Serum creatinine has improved to 1.72 No acute complaints. No shortness of breath. No nausea or vomiting  Objective:  Vital signs in last 24 hours:  Temp:  [97.8 F (36.6 C)-98.2 F (36.8 C)] 98.2 F (36.8 C) (10/22 1249) Pulse Rate:  [74-87] 82 (10/22 1249) Resp:  [17-18] 18 (10/22 1249) BP: (105-126)/(39-67) 107/39 mmHg (10/22 1249) SpO2:  [90 %-99 %] 94 % (10/22 1249)  Weight change:  Filed Weights   08/10/15 0919  Weight: 76.658 kg (169 lb)    Intake/Output:    Intake/Output Summary (Last 24 hours) at 08/14/15 1301 Last data filed at 08/14/15 0700  Gross per 24 hour  Intake 931.83 ml  Output   2000 ml  Net -1068.17 ml     Physical Exam: General:  no acute distress, laying in the bed   HEENT  moist mucous membranes, anicteric sclera   Neck  supple, no masses   Pulm/lungs  mild scattered wheezing, normal respiratory effort   CVS/Heart  regular rate, no rub or gallop   Abdomen:   soft, nontender, nondistended   Extremities:  right above-the-knee amputation, left no edema   Neurologic:  alert, oriented   Skin:  no acute rashes   Access:        Basic Metabolic Panel:   Recent Labs Lab 08/10/15 0922 08/11/15 0503 08/12/15 1507 08/13/15 1035 08/14/15 0552  NA 141 142 138 136 138  K 3.6 3.6 3.6 4.0 4.0  CL 107 108 101 102 107  CO2 GLUCOSE 168* 198* 124* 280* 210*  BUN 16 19 35* 50* 47*  CREATININE 1.27* 1.19* 2.73* 2.80* 1.85*  1.72*  CALCIUM 9.4 8.4* 8.5* 8.1* 8.2*     CBC:  Recent Labs Lab 08/12/15 1507  WBC 14.4*  HGB 11.9*  HCT 36.9  MCV 88.9  PLT 222      Microbiology:  Recent Results (from the past 720 hour(s))  MRSA PCR Screening     Status: None   Collection Time: 08/10/15  3:15 PM  Result Value Ref Range Status   MRSA by PCR NEGATIVE NEGATIVE Final    Comment:        The GeneXpert MRSA Assay (FDA approved for NASAL specimens only), is one component of  a comprehensive MRSA colonization surveillance program. It is not intended to diagnose MRSA infection nor to guide or monitor treatment for MRSA infections.     Coagulation Studies: No results for input(s): LABPROT, INR in the last 72 hours.  Urinalysis: No results for input(s): COLORURINE, LABSPEC, PHURINE, GLUCOSEU, HGBUR, BILIRUBINUR, KETONESUR, PROTEINUR, UROBILINOGEN, NITRITE, LEUKOCYTESUR in the last 72 hours.  Invalid input(s): APPERANCEUR    Imaging: Dg Chest 2 View  08/14/2015  CLINICAL DATA:  CHF.  Cough and possible pneumonia. EXAM: CHEST  2 VIEW COMPARISON:  08/12/2015 FINDINGS: The cardiac silhouette remains upper limits of normal to mildly enlarged in size. The patient has taken a slightly greater inspiration than on the prior study. There is improved aeration of the left lung base with minimal atelectasis remaining. Mild fissural thickening is present. There is mild diffuse interstitial prominence which has improved from the prior study. No pleural effusion or pneumothorax is seen. Thoracic spondylosis is noted. Upper abdominal surgical clips are noted. IMPRESSION: Improved aeration of the left lung base. Improved, now mild interstitial prominence which may reflect improved interstitial edema. Electronically Signed   By: Sebastian Ache M.D.   On:  08/14/2015 10:33     Medications:   . sodium chloride 50 mL/hr at 08/13/15 1427   . allopurinol  100 mg Oral Daily  . amitriptyline  50 mg Oral QHS  . antiseptic oral rinse  7 mL Mouth Rinse BID  . clopidogrel  75 mg Oral Daily  . DULoxetine  60 mg Oral Daily  . enoxaparin (LOVENOX) injection  40 mg Subcutaneous Q24H  . insulin aspart  0-24 Units Subcutaneous 6 times per day  . levofloxacin  250 mg Oral Daily  . methocarbamol  500 mg Oral QID  . methylPREDNISolone (SOLU-MEDROL) injection  60 mg Intravenous Q24H  . pantoprazole  40 mg Oral BID  . pregabalin  300 mg Oral BID  . rosuvastatin  20 mg Oral Daily  .  senna-docusate  2 tablet Oral BID   ipratropium-albuterol, oxyCODONE  Assessment/ Plan:  59 y.o. AA female with medical problems of peripheral vascular disease, HTN, DM with complications, peripheral neuropathy, h/o Rt AKA who was [facility] for evaluation of Atherosclerotic occlusive disease bilateral lower extremities with rest pain   1. Acute renal failure. Likely secondary to IV contrast exposure on October 18. 95 cc of IV contrast was used Serum creatinine has started to improve. Today's level is 1.7 to Electrolytes and volume status are acceptable No acute indication for dialysis May discontinue maintenance IV fluids 2. 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 Diabetes type 2 with CKD Obtain hemoglobin A1c, results pending 4. Current smoker Currently smokes half pack per day Counselled to quit smoking this admission   LOS: 4 Edis Huish 10/22/20161:01 PM

## 2015-08-14 NOTE — Progress Notes (Signed)
Enoxaparin   Patient qualifies for Enoxaparin 40 mg SQ daily based on CrCl >30 ml/min per policy. Will change to Enoxaparin 40 mg SQ daily.  Ginnie Marich D. Donnah Levert, PharmD   

## 2015-08-14 NOTE — Progress Notes (Signed)
[facility] [place] at [facility]   PATIENT NAME: [name] DecampCornell [name]    MR#:  [id number]  DATE OF BIRTH:  07/19/1956  SUBJECTIVE:  Sob improving  REVIEW OF SYSTEMS:   Review of Systems  Constitutional: Negative for fever, chills and weight loss.  HENT: Negative for ear discharge, ear pain and nosebleeds.   Eyes: Negative for blurred vision, pain and discharge.  Respiratory: Positive for shortness of breath. Negative for sputum production, wheezing and stridor.   Cardiovascular: Negative for chest pain, palpitations, orthopnea and PND.  Gastrointestinal: Negative for nausea, vomiting, abdominal pain and diarrhea.  Genitourinary: Negative for urgency and frequency.  Musculoskeletal: Negative for back pain and joint pain.  Neurological: Negative for sensory change, speech change, focal weakness and weakness.  Psychiatric/Behavioral: Negative for depression and hallucinations. The patient is not nervous/anxious.   All other systems reviewed and are negative.  Tolerating Diet:yes Tolerating PT: pending  DRUG ALLERGIES:  No Known Allergies  VITALS:  Blood pressure 126/62, pulse 75, temperature 97.8 F (36.6 C), temperature source Oral, resp. rate 18, height 5\' 6"  (1.676 m), weight 76.658 kg (169 lb), SpO2 94 %.  PHYSICAL EXAMINATION:   Physical Exam  GENERAL:  59 y.o.-year-old patient lying in the bed with no acute distress.  EYES: Pupils equal, round, reactive to light and accommodation. No scleral icterus. Extraocular muscles intact.  HEENT: Head atraumatic, normocephalic. Oropharynx and nasopharynx clear.  NECK:  Supple, no jugular venous distention. No thyroid enlargement, no tenderness.  LUNGS: Normal breath sounds bilaterally, no wheezing, rales, rhonchi. No use of accessory muscles of respiration.  CARDIOVASCULAR: S1, S2 normal. No murmurs, rubs, or gallops.  ABDOMEN: Soft, nontender, nondistended. Bowel sounds present. No organomegaly or mass.   EXTREMITIES: No cyanosis, clubbing or edema b/l.   Right BKA,,chronic NEUROLOGIC: Cranial nerves II through XII are intact. No focal Motor or sensory deficits b/l.   PSYCHIATRIC: The patient is alert and oriented x 3.  SKIN: No obvious rash, lesion, or ulcer.   LABORATORY PANEL:   CBC  Recent Labs Lab 08/12/15 1507  WBC 14.4*  HGB 11.9*  HCT 36.9  PLT 222   Chemistries   Recent Labs Lab 08/12/15 1507  08/14/15 0552  NA 138  < > 138  K 3.6  < > 4.0  CL 101  < > 107  CO2 27  < > 24  GLUCOSE 124*  < > 210*  BUN 35*  < > 47*  CREATININE 2.73*  < > 1.85*  1.72*  CALCIUM 8.5*  < > 8.2*  AST 42*  --   --   ALT 40  --   --   ALKPHOS 70  --   --   BILITOT 0.8  --   --   < > = values in this interval not displayed.  Cardiac Enzymes No results for input(s): TROPONINI in the last 168 hours.  RADIOLOGY:  Dg Chest 2 View  08/14/2015  CLINICAL DATA:  CHF.  Cough and possible pneumonia. EXAM: CHEST  2 VIEW COMPARISON:  08/12/2015 FINDINGS: The cardiac silhouette remains upper limits of normal to mildly enlarged in size. The patient has taken a slightly greater inspiration than on the prior study. There is improved aeration of the left lung base with minimal atelectasis remaining. Mild fissural thickening is present. There is mild diffuse interstitial prominence which has improved from the prior study. No pleural effusion or pneumothorax is seen. Thoracic spondylosis is noted. Upper abdominal surgical clips are  noted. IMPRESSION: Improved aeration of the left lung base. Improved, now mild interstitial prominence which may reflect improved interstitial edema. Electronically Signed   By: Sebastian Ache M.D.   On: 08/14/2015 10:33   Dg Chest 2 View  08/12/2015  CLINICAL DATA:  Lethargic unresponsive, cough EXAM: CHEST  2 VIEW COMPARISON:  09/24/2013 FINDINGS: Mild cardiac enlargement similar to prior study. Moderate diffuse interstitial process. Discoid atelectasis right mid lung zone and  left mid to lower lung zone. No pleural effusion. Vascular pattern appears normal. IMPRESSION: Moderate diffuse interstitial change which is new or increased when compared to prior studies. Cyst concerning for atypical pneumonia. Interstitial pulmonary edema is considered much less likely. Electronically Signed   By: Esperanza Heir M.D.   On: 08/12/2015 12:53   ASSESSMENT AND PLAN:   [name] [name] is a 59 y.o. female with a known history of peripheral arterial disease, hypertension, diabetes, peripheral neuropathy admitted to the hospital for left lower extremity peripheral arterial disease procedure  Bilateral pneumonitis with possible COPD flare po Levaquin. Wbc mildly elevated  * Acute respiratory failure Due to pneumonia. Wean oxygen as tolerated. Patient's chest x-ray does not suggest any significant edema but she does have crackles on examination.  -recieved a dose of IV Lasix yday -nebs prn -po levaquin(day 2/7). Wean oxygen to room air.   * Acute renal failure  Suspect due to IV contrast nephropathy Baseline creatinine is 0.9-1.15 Came i wih creat of 1.73--->2.73--->2.80--->1.72 Appreciate Nephrology help  * Peripheral arterial disease Pt is  post procedure by vascular surgery for left LE s/p angioplasty and stenting   * Chronic pain syndrome Resume home meds  * DVT prophylaxis lovenox (renal dose) Overall much better. Will sign off. Call if needed  Case discussed with Care Management/Social Worker. Management plans discussed with the patient, family and they are in agreemet CODE STATUS: full  DVT Prophylaxis: lovenox  TOTAL TIME TAKING CARE OF THIS PATIENT: 35 minutes.  >50% time spent on counselling and coordination of care pt and family  POSSIBLE D/C IN 1-2DAYS, DEPENDING ON CLINICAL CONDITION.   Cyrus Ramsburg M.D on 08/14/2015 at 11:53 AM  Between 7am to 6pm - Pager - [phone]  After 6pm go to [email or web address] - password [email or web address]  Fabio Neighbors  Hospitalists  Office  [phone]  CC: Primary care physician; Pcp Not In System

## 2015-08-14 NOTE — Progress Notes (Signed)
Bipap on standby. Pt is tolerating 2L [place] well

## 2015-08-15 LAB — BASIC METABOLIC PANEL
ANION GAP: 6 (ref 5–15)
BUN: 34 mg/dL — ABNORMAL HIGH (ref 6–20)
CALCIUM: 8.8 mg/dL — AB (ref 8.9–10.3)
CO2: 27 mmol/L (ref 22–32)
Chloride: 110 mmol/L (ref 101–111)
Creatinine, Ser: 1.12 mg/dL — ABNORMAL HIGH (ref 0.44–1.00)
GFR, EST NON AFRICAN AMERICAN: 52 mL/min — AB (ref >60)
Glucose, Bld: 183 mg/dL — ABNORMAL HIGH (ref 65–99)
Potassium: 3.7 mmol/L (ref 3.5–5.1)
SODIUM: 143 mmol/L (ref 135–145)

## 2015-08-15 LAB — GLUCOSE, CAPILLARY
GLUCOSE-CAPILLARY: 165 mg/dL — AB (ref 65–99)
GLUCOSE-CAPILLARY: 232 mg/dL — AB (ref 65–99)
Glucose-Capillary: 145 mg/dL — ABNORMAL HIGH (ref 65–99)
Glucose-Capillary: 243 mg/dL — ABNORMAL HIGH (ref 65–99)
Glucose-Capillary: 86 mg/dL (ref 65–99)
Glucose-Capillary: 225 mg/dL — ABNORMAL HIGH (ref 65–99)

## 2015-08-15 NOTE — Progress Notes (Signed)
Subjective:   Today, she is doing better Serum creatinine has improved to 1.12 No acute complaints. No shortness of breath. No nausea or vomiting  Objective:  Vital signs in last 24 hours:  Temp:  [98.1 F (36.7 C)-98.2 F (36.8 C)] 98.2 F (36.8 C) (10/23 0545) Pulse Rate:  [71-90] 71 (10/23 0545) Resp:  [18-21] 21 (10/23 0545) BP: (107-133)/(39-72) 125/63 mmHg (10/23 0545) SpO2:  [93 %-97 %] 97 % (10/23 0545)  Weight change:  Filed Weights   08/10/15 0919  Weight: 76.658 kg (169 lb)    Intake/Output:    Intake/Output Summary (Last 24 hours) at 08/15/15 1136 Last data filed at 08/15/15 1025  Gross per 24 hour  Intake   1190 ml  Output    850 ml  Net    340 ml     Physical Exam: General:  no acute distress, laying in the bed   HEENT  moist mucous membranes, anicteric sclera   Neck  supple, no masses   Pulm/lungs  mild scattered wheezing, normal respiratory effort   CVS/Heart  regular rate, no rub or gallop   Abdomen:   soft, nontender, nondistended   Extremities:  right above-the-knee amputation, left no edema   Neurologic:  alert, oriented   Skin:  no acute rashes   Access:        Basic Metabolic Panel:   Recent Labs Lab 08/11/15 0503 08/12/15 1507 08/13/15 1035 08/14/15 0552 08/15/15 0617  NA 142 138 136 138 143  K 3.6 3.6 4.0 4.0 3.7  CL 108 101 102 107 110  CO2 GLUCOSE 198* 124* 280* 210* 183*  BUN 19 35* 50* 47* 34*  CREATININE 1.19* 2.73* 2.80* 1.85*  1.72* 1.12*  CALCIUM 8.4* 8.5* 8.1* 8.2* 8.8*     CBC:  Recent Labs Lab 08/12/15 1507  WBC 14.4*  HGB 11.9*  HCT 36.9  MCV 88.9  PLT 222      Microbiology:  Recent Results (from the past 720 hour(s))  MRSA PCR Screening     Status: None   Collection Time: 08/10/15  3:15 PM  Result Value Ref Range Status   MRSA by PCR NEGATIVE NEGATIVE Final    Comment:        The GeneXpert MRSA Assay (FDA approved for NASAL specimens only), is one component of  a comprehensive MRSA colonization surveillance program. It is not intended to diagnose MRSA infection nor to guide or monitor treatment for MRSA infections.     Coagulation Studies: No results for input(s): LABPROT, INR in the last 72 hours.  Urinalysis: No results for input(s): COLORURINE, LABSPEC, PHURINE, GLUCOSEU, HGBUR, BILIRUBINUR, KETONESUR, PROTEINUR, UROBILINOGEN, NITRITE, LEUKOCYTESUR in the last 72 hours.  Invalid input(s): APPERANCEUR    Imaging: Dg Chest 2 View  08/14/2015  CLINICAL DATA:  CHF.  Cough and possible pneumonia. EXAM: CHEST  2 VIEW COMPARISON:  08/12/2015 FINDINGS: The cardiac silhouette remains upper limits of normal to mildly enlarged in size. The patient has taken a slightly greater inspiration than on the prior study. There is improved aeration of the left lung base with minimal atelectasis remaining. Mild fissural thickening is present. There is mild diffuse interstitial prominence which has improved from the prior study. No pleural effusion or pneumothorax is seen. Thoracic spondylosis is noted. Upper abdominal surgical clips are noted. IMPRESSION: Improved aeration of the left lung base. Improved, now mild interstitial prominence which may reflect improved interstitial edema. Electronically Signed   By: Freida Busman  Mosetta PuttGrady M.D.   On: 08/14/2015 10:33     Medications:     . allopurinol  100 mg Oral Daily  . amitriptyline  50 mg Oral QHS  . antiseptic oral rinse  7 mL Mouth Rinse BID  . clopidogrel  75 mg Oral Daily  . DULoxetine  60 mg Oral Daily  . enoxaparin (LOVENOX) injection  40 mg Subcutaneous Q24H  . insulin aspart  0-24 Units Subcutaneous 6 times per day  . levofloxacin  250 mg Oral Daily  . methocarbamol  500 mg Oral QID  . methylPREDNISolone (SOLU-MEDROL) injection  60 mg Intravenous Q24H  . pantoprazole  40 mg Oral BID  . pregabalin  300 mg Oral BID  . rosuvastatin  20 mg Oral Daily  . senna-docusate  2 tablet Oral BID    ipratropium-albuterol, oxyCODONE  Assessment/ Plan:  59 y.o. AA female with medical problems of peripheral vascular disease, HTN, DM with complications, peripheral neuropathy, h/o Rt AKA who [facility] for evaluation of Atherosclerotic occlusive disease bilateral lower extremities with rest pain   1. Acute renal failure. Likely secondary to IV contrast exposure on October 18. 95 cc of IV contrast was used Serum creatinine has started to improve. Today's level is 1.12 Serum creatinine is now back to baseline  2. 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 Diabetes type 2 with CKD Obtain hemoglobin A1c, results pending  4. Current smoker Currently smokes half pack per day Counselled to quit smoking this admission   LOS: 5 [name] [name] 10/23/201611:36 AM

## 2015-08-15 NOTE — Progress Notes (Signed)
    Subjective  - POD #4  Wants to go home C/o left heel pain  Physical Exam:  Palpable left DP pulse       Assessment/Plan:  POD #4  Acute renal failure, resolved.  Appreciate nephrology assistance Continue with mobilization Home soon  Durene CalBrabham, Wells 08/15/2015 5:36 PM --  Filed Vitals:   08/15/15 1257  BP: 120/60  Pulse: 76  Temp: 98.6 F (37 C)  Resp: 18    Intake/Output Summary (Last 24 hours) at 08/15/15 1736 Last data filed at 08/15/15 1257  Gross per 24 hour  Intake    360 ml  Output    600 ml  Net   -240 ml     Laboratory CBC    Component Value Date/Time   WBC 14.4* 08/12/2015 1507   WBC 12.7* 09/28/2013 0556   HGB 11.9* 08/12/2015 1507   HGB 11.5* 09/28/2013 0556   HCT 36.9 08/12/2015 1507   HCT 34.6* 09/28/2013 0556   PLT 222 08/12/2015 1507   PLT 265 09/28/2013 0556    BMET    Component Value Date/Time   NA 143 08/15/2015 0617   NA 137 09/28/2013 0556   K 3.7 08/15/2015 0617   K 3.0* 09/28/2013 0556   CL 110 08/15/2015 0617   CL 99 09/28/2013 0556   CO2 27 08/15/2015 0617   CO2 27 09/28/2013 0556   GLUCOSE 183* 08/15/2015 0617   GLUCOSE 105* 09/28/2013 0556   BUN 34* 08/15/2015 0617   BUN 19* 09/28/2013 0556   CREATININE 1.12* 08/15/2015 0617   CREATININE 1.47* 09/28/2013 0556   CALCIUM 8.8* 08/15/2015 0617   CALCIUM 9.0 09/28/2013 0556   GFRNONAA 52* 08/15/2015 0617   GFRNONAA 39* 09/28/2013 0556   GFRAA >60 08/15/2015 0617   GFRAA 45* 09/28/2013 0556    COAG Lab Results  Component Value Date   INR 1.2 09/23/2013   INR 1.0 09/15/2013   INR 1.4 07/24/2013   No results found for: PTT  Antibiotics Anti-infectives    Start     Dose/Rate Route Frequency Ordered Stop   08/14/15 1000  levofloxacin (LEVAQUIN) tablet 250 mg     250 mg Oral Daily 08/13/15 1021     08/13/15 1030  levofloxacin (LEVAQUIN) tablet 500 mg  Status:  Discontinued     500 mg Oral Daily 08/13/15 1017 08/13/15 1020   08/13/15 1030   levofloxacin (LEVAQUIN) tablet 500 mg     500 mg Oral  Once 08/13/15 1021 08/13/15 1238   08/12/15 1430  levofloxacin (LEVAQUIN) IVPB 500 mg  Status:  Discontinued     500 mg 100 mL/hr over 60 Minutes Intravenous Every 24 hours 08/12/15 1422 08/13/15 1017   08/12/15 1400  piperacillin-tazobactam (ZOSYN) IVPB 3.375 g  Status:  Discontinued     3.375 g 12.5 mL/hr over 240 Minutes Intravenous Every 8 hours 08/12/15 1340 08/13/15 1017   08/10/15 0946  dextrose 5 % with cefUROXime (ZINACEF) ADS Med    Comments:  MAYNOR, ERIN: cabinet override      08/10/15 0946 08/10/15 1038   08/10/15 0903  cefUROXime (ZINACEF) 1.5 g in dextrose 5 % 50 mL IVPB  Status:  Discontinued     1.5 g 100 mL/hr over 30 Minutes Intravenous 30 min pre-op 08/10/15 0903 08/10/15 1350       V. Charlena CrossWells Reynoldo Mainer IV, M.D. [facility] Office: [phone] Pager:  [phone]

## 2015-08-16 LAB — GLUCOSE, CAPILLARY
GLUCOSE-CAPILLARY: 78 mg/dL (ref 65–99)
GLUCOSE-CAPILLARY: 294 mg/dL — AB (ref 65–99)
GLUCOSE-CAPILLARY: 263 mg/dL — AB (ref 65–99)
Glucose-Capillary: 122 mg/dL — ABNORMAL HIGH (ref 65–99)
Glucose-Capillary: 128 mg/dL — ABNORMAL HIGH (ref 65–99)
Glucose-Capillary: 150 mg/dL — ABNORMAL HIGH (ref 65–99)
Glucose-Capillary: 227 mg/dL — ABNORMAL HIGH (ref 65–99)

## 2015-08-16 NOTE — Progress Notes (Signed)
Subjective:  Cr was down to 1.12 yesterday. Overall feeling well today. Sitting up in chair.    Objective:  Vital signs in last 24 hours:  Temp:  [98.5 F (36.9 C)-98.6 F (37 C)] 98.5 F (36.9 C) (10/24 0520) Pulse Rate:  [64-76] 64 (10/24 0520) Resp:  [14-18] 14 (10/24 0520) BP: (120-140)/(60-78) 140/76 mmHg (10/24 0520) SpO2:  [91 %-100 %] 91 % (10/24 0927)  Weight change:  Filed Weights   08/10/15 0919  Weight: 76.658 kg (169 lb)    Intake/Output:    Intake/Output Summary (Last 24 hours) at 08/16/15 1211 Last data filed at 08/16/15 0800  Gross per 24 hour  Intake    480 ml  Output      0 ml  Net    480 ml     Physical Exam: General:  no acute distress, laying in the bed   HEENT  moist mucous membranes, anicteric sclera   Neck  supple  Pulm/lungs  CTAB, normal respiratory effort   CVS/Heart  regular rate, no rub or gallop   Abdomen:   soft, nontender, nondistended   Extremities:  right above-the-knee amputation, left no edema   Neurologic:  alert, oriented, follows commands  Skin:  no acute rashes   Access:        Basic Metabolic Panel:   Recent Labs Lab 08/11/15 0503 08/12/15 1507 08/13/15 1035 08/14/15 0552 08/15/15 0617  NA 142 138 136 138 143  K 3.6 3.6 4.0 4.0 3.7  CL 108 101 102 107 110  CO2 26 27 25 24 27   GLUCOSE 198* 124* 280* 210* 183*  BUN 19 35* 50* 47* 34*  CREATININE 1.19* 2.73* 2.80* 1.85*  1.72* 1.12*  CALCIUM 8.4* 8.5* 8.1* 8.2* 8.8*     CBC:  Recent Labs Lab 08/12/15 1507  WBC 14.4*  HGB 11.9*  HCT 36.9  MCV 88.9  PLT 222      Microbiology:  Recent Results (from the past 720 hour(s))  MRSA PCR Screening     Status: None   Collection Time: 08/10/15  3:15 PM  Result Value Ref Range Status   MRSA by PCR NEGATIVE NEGATIVE Final    Comment:        The GeneXpert MRSA Assay (FDA approved for NASAL specimens only), is one component of a comprehensive MRSA colonization surveillance program. It is  not intended to diagnose MRSA infection nor to guide or monitor treatment for MRSA infections.     Coagulation Studies: No results for input(s): LABPROT, INR in the last 72 hours.  Urinalysis: No results for input(s): COLORURINE, LABSPEC, PHURINE, GLUCOSEU, HGBUR, BILIRUBINUR, KETONESUR, PROTEINUR, UROBILINOGEN, NITRITE, LEUKOCYTESUR in the last 72 hours.  Invalid input(s): APPERANCEUR    Imaging: No results found.   Medications:     . allopurinol  100 mg Oral Daily  . amitriptyline  50 mg Oral QHS  . antiseptic oral rinse  7 mL Mouth Rinse BID  . clopidogrel  75 mg Oral Daily  . DULoxetine  60 mg Oral Daily  . enoxaparin (LOVENOX) injection  40 mg Subcutaneous Q24H  . insulin aspart  0-24 Units Subcutaneous 6 times per day  . levofloxacin  250 mg Oral Daily  . methocarbamol  500 mg Oral QID  . methylPREDNISolone (SOLU-MEDROL) injection  60 mg Intravenous Q24H  . pantoprazole  40 mg Oral BID  . pregabalin  300 mg Oral BID  . rosuvastatin  20 mg Oral Daily  . senna-docusate  2 tablet Oral BID  ipratropium-albuterol, oxyCODONE  Assessment/ Plan:  59 y.o. AA female with medical problems of peripheral vascular disease, HTN, DM with complications, peripheral neuropathy, h/o Rt AKA who was admitted to [facility] on 08/10/2015 for evaluation of Atherosclerotic occlusive disease bilateral lower extremities with rest pain   1. Acute renal failure. Likely secondary to IV contrast exposure on October 18. 95 cc of IV contrast was used -No new Cr today, Cr was 1.12 yesterday. Would avoid further nephrotoxins at present.    2. 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   3. Diabetes type 2 with CKD No new A1c available at present, would recommend monitoring of this as outpt.  4. Current smoker Currently smokes half pack per day Counselled to quit smoking this admission   LOS: 6 Travaris Kosh,  Geoge Lawrance 10/24/201612:11 PM

## 2015-08-16 NOTE — Care Management Important Message (Signed)
Important Message  Patient Details  Name: [name] [name] MRN: [id number] Date of Birth: 02/07/1956   Medicare Important Message Given:  Yes-third notification given    Olegario MessierKathy A Allmond 08/16/2015, 10:02 AM

## 2015-08-16 NOTE — Progress Notes (Signed)
Initial Nutrition Assessment  DOCUMENTATION CODES:      INTERVENTION:  Meals and snacks: Cater to pt preferences   NUTRITION DIAGNOSIS:    (None at this time) related to   as evidenced by  .    GOAL:   Patient will meet greater than or equal to 90% of their needs    MONITOR:    (Energy intake, )  REASON FOR ASSESSMENT:   LOS    ASSESSMENT:      Pt with ARF, bilateral pneumonia, left LE s/p angioplasty and stenting  Past Medical History  Diagnosis Date  . PVD (peripheral vascular disease) with claudication (HCC)   . HTN (hypertension)   . Diabetes mellitus (HCC)   . Peripheral neuropathy (HCC)     Current Nutrition: eating 100% of meals   Food/Nutrition-Related History: normal intake prior to admission per pt   Scheduled Medications:  . allopurinol  100 mg Oral Daily  . amitriptyline  50 mg Oral QHS  . antiseptic oral rinse  7 mL Mouth Rinse BID  . clopidogrel  75 mg Oral Daily  . DULoxetine  60 mg Oral Daily  . enoxaparin (LOVENOX) injection  40 mg Subcutaneous Q24H  . insulin aspart  0-24 Units Subcutaneous 6 times per day  . levofloxacin  250 mg Oral Daily  . methocarbamol  500 mg Oral QID  . methylPREDNISolone (SOLU-MEDROL) injection  60 mg Intravenous Q24H  . pantoprazole  40 mg Oral BID  . pregabalin  300 mg Oral BID  . rosuvastatin  20 mg Oral Daily  . senna-docusate  2 tablet Oral BID         Electrolyte/Renal Profile and Glucose Profile:   Recent Labs Lab 08/13/15 1035 08/14/15 0552 08/15/15 0617  NA 136 138 143  K 4.0 4.0 3.7  CL 102 107 110  CO2 25 24 27   BUN 50* 47* 34*  CREATININE 2.80* 1.85*  1.72* 1.12*  CALCIUM 8.1* 8.2* 8.8*  GLUCOSE 280* 210* 183*   Protein Profile:  Recent Labs Lab 08/12/15 1507  ALBUMIN 3.4*    Gastrointestinal Profile: Last BM: 10/24     Weight Change: stable wt per pt    Diet Order:  Diet Carb Modified Fluid consistency:: Thin; Room service appropriate?: Yes  Skin:    reviewed   Height:   Ht Readings from Last 1 Encounters:  08/10/15 5\' 6"  (1.676 m)    Weight:   Wt Readings from Last 1 Encounters:  08/10/15 169 lb (76.658 kg)     BMI:  Body mass index is 27.29 kg/(m^2).   EDUCATION NEEDS:   No education needs identified at this time  LOW Care Level  [name] [name], RD, LDN [phone] (pager)

## 2015-08-16 NOTE — Progress Notes (Signed)
    Subjective  - POD #4  Still c/o left heel pain  Physical Exam:  Palpable left DP pulse 2+        Assessment/Plan:  POD #5 Acute renal failure, resolved.  Appreciate nephrology assistance Continue with mobilization Home tomorrow   Renford DillsSchnier, Staysha Truby G 08/16/2015 6:35 PM --  Ceasar MonsFiled Vitals:   08/16/15 1236  BP: 110/71  Pulse: 72  Temp: 98 F (36.7 C)  Resp: 18    Intake/Output Summary (Last 24 hours) at 08/16/15 1835 Last data filed at 08/16/15 1739  Gross per 24 hour  Intake    820 ml  Output    300 ml  Net    520 ml     Laboratory CBC    Component Value Date/Time   WBC 14.4* 08/12/2015 1507   WBC 12.7* 09/28/2013 0556   HGB 11.9* 08/12/2015 1507   HGB 11.5* 09/28/2013 0556   HCT 36.9 08/12/2015 1507   HCT 34.6* 09/28/2013 0556   PLT 222 08/12/2015 1507   PLT 265 09/28/2013 0556    BMET    Component Value Date/Time   NA 143 08/15/2015 0617   NA 137 09/28/2013 0556   K 3.7 08/15/2015 0617   K 3.0* 09/28/2013 0556   CL 110 08/15/2015 0617   CL 99 09/28/2013 0556   CO2 27 08/15/2015 0617   CO2 27 09/28/2013 0556   GLUCOSE 183* 08/15/2015 0617   GLUCOSE 105* 09/28/2013 0556   BUN 34* 08/15/2015 0617   BUN 19* 09/28/2013 0556   CREATININE 1.12* 08/15/2015 0617   CREATININE 1.47* 09/28/2013 0556   CALCIUM 8.8* 08/15/2015 0617   CALCIUM 9.0 09/28/2013 0556   GFRNONAA 52* 08/15/2015 0617   GFRNONAA 39* 09/28/2013 0556   GFRAA >60 08/15/2015 0617   GFRAA 45* 09/28/2013 0556    COAG Lab Results  Component Value Date   INR 1.2 09/23/2013   INR 1.0 09/15/2013   INR 1.4 07/24/2013   No results found for: PTT  Antibiotics Anti-infectives    Start     Dose/Rate Route Frequency Ordered Stop   08/14/15 1000  levofloxacin (LEVAQUIN) tablet 250 mg     250 mg Oral Daily 08/13/15 1021     08/13/15 1030  levofloxacin (LEVAQUIN) tablet 500 mg  Status:  Discontinued     500 mg Oral Daily 08/13/15 1017 08/13/15 1020   08/13/15 1030  levofloxacin  (LEVAQUIN) tablet 500 mg     500 mg Oral  Once 08/13/15 1021 08/13/15 1238   08/12/15 1430  levofloxacin (LEVAQUIN) IVPB 500 mg  Status:  Discontinued     500 mg 100 mL/hr over 60 Minutes Intravenous Every 24 hours 08/12/15 1422 08/13/15 1017   08/12/15 1400  piperacillin-tazobactam (ZOSYN) IVPB 3.375 g  Status:  Discontinued     3.375 g 12.5 mL/hr over 240 Minutes Intravenous Every 8 hours 08/12/15 1340 08/13/15 1017   08/10/15 0946  dextrose 5 % with cefUROXime (ZINACEF) ADS Med    Comments:  MAYNOR, ERIN: cabinet override      08/10/15 0946 08/10/15 1038   08/10/15 0903  cefUROXime (ZINACEF) 1.5 g in dextrose 5 % 50 mL IVPB  Status:  Discontinued     1.5 g 100 mL/hr over 30 Minutes Intravenous 30 min pre-op 08/10/15 0903 08/10/15 1350

## 2015-08-16 NOTE — Progress Notes (Signed)
Physical Therapy Treatment Patient Details Name: [name] [name] MRN: [id number] DOB: 09-26-1956 Today's Date: 08/16/2015    History of Present Illness Pt is a 59 yo female w. R AKA admitted s/p successful revascularization procedure of LLE.     PT Comments    Pt was sitting up on EOB upon arrival to room without [place] on. Pt's SpO2% was checked prior to initiation of activity and found to be 90-91% on room air. Pt denied any SOB and states that her breathing has been doing better as of late. Pt was able to perform sit<>stand x 10 w/ BUE support on RW and PT supervision; no instances of L knee buckling noted. Pt's SpO2% was re-assessed and found to be 92%. Ambulation was performed x 25 ft with RW and CGA. Pt utilizes a hop-to pattern without the use of her RLE prosthetic (pt does not have sleeves for her prosthetic with her). Pt did well with gait and no instances of imbalance noted. Pt's SpO2% was checked again after gait and found to be 92%. Pt was slightly SOB at the end of session. Pt requires further skilled PT services in order to progress functional mobility and endurance.  Follow Up Recommendations  SNF     Equipment Recommendations  Rolling walker with 5" wheels    Recommendations for Other Services       Precautions / Restrictions Precautions Precautions: Fall Restrictions Weight Bearing Restrictions: No    Mobility  Bed Mobility Overal bed mobility: Modified Independent                Transfers Overall transfer level: Needs assistance Equipment used: Rolling walker (2 wheeled) Transfers: Sit to/from Stand Sit to Stand: Supervision         General transfer comment: pt able to perform sit <>stand transfer x 10 w/ supervision and BUE support on RW.  Ambulation/Gait Ambulation/Gait assistance: Min guard Ambulation Distance (Feet): 25 Feet Assistive device: Rolling walker (2 wheeled)     Gait velocity interpretation: <1.8 ft/sec, indicative of risk for  recurrent falls General Gait Details: Pt uses hop-to pattern where she progresses RW and uses BUE and LLE to push off and advance herself    Stairs            Wheelchair Mobility    Modified Rankin (Stroke Patients Only)       Balance Overall balance assessment: Needs assistance Sitting-balance support: No upper extremity supported;Feet supported Sitting balance-Leahy Scale: Good     Standing balance support: Bilateral upper extremity supported;During functional activity Standing balance-Leahy Scale: Good                      Cognition Arousal/Alertness: Awake/alert Behavior During Therapy: WFL for tasks assessed/performed Overall Cognitive Status: Within Functional Limits for tasks assessed                      Exercises Other Exercises Other Exercises: sit<>stand x 10 with min supervision and BUE support on RW: Push off from bed and able to achieve standing position without instances of L knee buckling.     General Comments        Pertinent Vitals/Pain Pain Assessment: No/denies pain Pain Intervention(s): Monitored during session;Repositioned    Home Living                      Prior Function            PT Goals (current goals can  now be found in the care plan section) Acute Rehab PT Goals Patient Stated Goal: to get rid of pain PT Goal Formulation: With patient Time For Goal Achievement: 08/13/15 Potential to Achieve Goals: Good Progress towards PT goals: Progressing toward goals    Frequency  Min 2X/week    PT Plan Current plan remains appropriate    Co-evaluation             End of Session Equipment Utilized During Treatment: Gait belt Activity Tolerance: Patient tolerated treatment well Patient left: in chair;with chair alarm set     Time: [phone] PT Time Calculation (min) (ACUTE ONLY): 23 min  Charges:                       G CodesGeorgina Peer:      Lissett Favorite,SPT 08/16/2015, 9:31 AM

## 2015-08-16 NOTE — Progress Notes (Signed)
Inpatient Diabetes Program Recommendations  AACE/ADA: New Consensus Statement on Inpatient Glycemic Control (2015)  Target Ranges:  Prepandial:   less than 140 mg/dL      Peak postprandial:   less than 180 mg/dL (1-2 hours)      Critically ill patients:  140 - 180 mg/dL   Review of Glycemic Control   Diabetes history: Type 2 Outpatient Diabetes medications: Onglyza 2.5mg /day Current orders for Inpatient glycemic control: Novolog insulin 0-24 units q4h   Inpatient Diabetes Program Recommendations:agree with current recommendation of Novolog insulin but consider changing Novolog correction to tid and hs now that the patient is eating.  Susette RacerJulie Arden Axon, RN, BA, MHA, CDE Diabetes Coordinator Inpatient Diabetes Program  [phone] (Team Pager) [phone] [facility] Office) 08/16/2015 11:44 AM

## 2015-08-16 NOTE — Progress Notes (Signed)
Pt is in no distress. Bipap is not needed

## 2015-08-17 DIAGNOSIS — L8990 Pressure ulcer of unspecified site, unspecified stage: Secondary | ICD-10-CM | POA: Insufficient documentation

## 2015-08-17 LAB — BASIC METABOLIC PANEL
Anion gap: 10 (ref 5–15)
BUN: 29 mg/dL — AB (ref 6–20)
CHLORIDE: 103 mmol/L (ref 101–111)
CO2: 28 mmol/L (ref 22–32)
CREATININE: 1.33 mg/dL — AB (ref 0.44–1.00)
Calcium: 8.8 mg/dL — ABNORMAL LOW (ref 8.9–10.3)
GFR calc Af Amer: 49 mL/min — ABNORMAL LOW (ref >60)
GFR calc non Af Amer: 42 mL/min — ABNORMAL LOW (ref >60)
GLUCOSE: 141 mg/dL — AB (ref 65–99)
POTASSIUM: 3.5 mmol/L (ref 3.5–5.1)
Sodium: 141 mmol/L (ref 135–145)

## 2015-08-17 LAB — GLUCOSE, CAPILLARY
GLUCOSE-CAPILLARY: 201 mg/dL — AB (ref 65–99)
Glucose-Capillary: 135 mg/dL — ABNORMAL HIGH (ref 65–99)
Glucose-Capillary: 68 mg/dL (ref 65–99)
Glucose-Capillary: 101 mg/dL — ABNORMAL HIGH (ref 65–99)

## 2015-08-17 MED ORDER — SODIUM CHLORIDE 0.9 % IV SOLN: INTRAVENOUS | Status: DC

## 2015-08-17 NOTE — Progress Notes (Signed)
Subjective:  Cr slightly higher today.  Pt resting comfortably at the moment.  Has some pain in her left heel.    Objective:  Vital signs in last 24 hours:  Temp:  [97.9 F (36.6 C)-98.3 F (36.8 C)] 97.9 F (36.6 C) (10/25 0602) Pulse Rate:  [65-72] 69 (10/25 0602) Resp:  [16-18] 16 (10/25 0602) BP: (110-149)/(67-80) 114/67 mmHg (10/25 0602) SpO2:  [92 %-97 %] 92 % (10/25 0602)  Weight change:  Filed Weights   08/10/15 0919  Weight: 76.658 kg (169 lb)    Intake/Output:    Intake/Output Summary (Last 24 hours) at 08/17/15 1156 Last data filed at 08/17/15 1048  Gross per 24 hour  Intake   1360 ml  Output    300 ml  Net   1060 ml     Physical Exam: General:  no acute distress, laying in the bed   HEENT  moist mucous membranes, anicteric sclera   Neck  supple  Pulm/lungs  CTAB, normal respiratory effort   CVS/Heart  regular rate, no rub or gallop   Abdomen:   soft, nontender, nondistended   Extremities:  right above-the-knee amputation, left lower extremity no edema   Neurologic:  alert, oriented, follows commands  Skin:  no acute rashes           Basic Metabolic Panel:   Recent Labs Lab 08/12/15 1507 08/13/15 1035 08/14/15 0552 08/15/15 0617 08/17/15 0818  NA 138 136 138 143 141  K 3.6 4.0 4.0 3.7 3.5  CL 101 102 107 110 103  CO2 GLUCOSE 124* 280* 210* 183* 141*  BUN 35* 50* 47* 34* 29*  CREATININE 2.73* 2.80* 1.85*  1.72* 1.12* 1.33*  CALCIUM 8.5* 8.1* 8.2* 8.8* 8.8*     CBC:  Recent Labs Lab 08/12/15 1507  WBC 14.4*  HGB 11.9*  HCT 36.9  MCV 88.9  PLT 222      Microbiology:  Recent Results (from the past 720 hour(s))  MRSA PCR Screening     Status: None   Collection Time: 08/10/15  3:15 PM  Result Value Ref Range Status   MRSA by PCR NEGATIVE NEGATIVE Final    Comment:        The GeneXpert MRSA Assay (FDA approved for NASAL specimens only), is one component of a comprehensive MRSA  colonization surveillance program. It is not intended to diagnose MRSA infection nor to guide or monitor treatment for MRSA infections.     Coagulation Studies: No results for input(s): LABPROT, INR in the last 72 hours.  Urinalysis: No results for input(s): COLORURINE, LABSPEC, PHURINE, GLUCOSEU, HGBUR, BILIRUBINUR, KETONESUR, PROTEINUR, UROBILINOGEN, NITRITE, LEUKOCYTESUR in the last 72 hours.  Invalid input(s): APPERANCEUR    Imaging: No results found.   Medications:     . allopurinol  100 mg Oral Daily  . amitriptyline  50 mg Oral QHS  . antiseptic oral rinse  7 mL Mouth Rinse BID  . clopidogrel  75 mg Oral Daily  . DULoxetine  60 mg Oral Daily  . enoxaparin (LOVENOX) injection  40 mg Subcutaneous Q24H  . insulin aspart  0-24 Units Subcutaneous 6 times per day  . levofloxacin  250 mg Oral Daily  . methocarbamol  500 mg Oral QID  . methylPREDNISolone (SOLU-MEDROL) injection  60 mg Intravenous Q24H  . pantoprazole  40 mg Oral BID  . pregabalin  300 mg Oral BID  . rosuvastatin  20 mg Oral Daily  . senna-docusate  2  tablet Oral BID   ipratropium-albuterol, oxyCODONE  Assessment/ Plan:  59 y.o. AA female with medical problems of peripheral vascular disease, HTN, DM with complications, peripheral neuropathy, h/o Rt AKA who [facility] for evaluation of Atherosclerotic occlusive disease bilateral lower extremities with rest pain   1. Acute renal failure. Likely secondary to IV contrast exposure on October 18. 95 cc of IV contrast was used -Cr slightly higher today at 1.33. Will restart gentle hydration till discharge.    2. 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   3. Diabetes type 2 with CKD Blood sugars remain labile, however pt noted to be on solumedrol which can potentially lead to this.  4. Current smoker Currently smokes half pack per day Counselled to quit  smoking this admission   LOS: 7 [name] [name] 10/25/201611:56 AM

## 2015-08-17 NOTE — Progress Notes (Signed)
08/17/2015 5:50 PM  BP 123/81 mmHg  Pulse 70  Temp(Src) 97.7 F (36.5 C) (Oral)  Resp 19  Ht 5\' 6"  (1.676 m)  Wt 76.658 kg (169 lb)  BMI 27.29 kg/m2  SpO2 98% Patient discharged per MD orders. Discharge instructions reviewed with patient and patient verbalized understanding. IV removed per policy. Prescriptions discussed and given to patient. Dressed and belongings gathered. [place] Will continue to monitor.  Ron ParkerHerron, Brendan Gruwell D, RN

## 2015-08-17 NOTE — Care Management (Signed)
Referral completed with [facility]. For home health PT RN

## 2015-08-17 NOTE — Progress Notes (Signed)
Physical Therapy Treatment Patient Details Name: [name] [name] MRN: [id number] DOB: 03/12/1956 Today's Date: 08/17/2015    History of Present Illness Pt is a 59 yo female w. R AKA admitted s/p successful revascularization procedure of LLE.     PT Comments    Pt c/o painful L heel that was wrapped in pink pressure sore pad upon arrival to room. Pt states that the L heel had developed pain overnight and that it appeared to be red this morning. Pain was monitored throughout session and activity was limited to reduce pressure placed on heel. Had pt practice maneuvering walker in tight spaces throughout room, practicing standing and reaching balance activities, and transferring from different surface heights with and without arm rests. Pt is capable of performing all mobility without risk of falling and without using prosthetic R leg. D/c recommendations will be updated to home with HHPT due to pt displaying proficiency and safety with mobility.    Follow Up Recommendations  Home health PT     Equipment Recommendations  Rolling walker with 5" wheels    Recommendations for Other Services       Precautions / Restrictions Precautions Precautions: Fall Required Braces or Orthoses:  (RLE prosthesis)    Mobility  Bed Mobility Overal bed mobility: Independent                Transfers Overall transfer level: Needs assistance Equipment used: Rolling walker (2 wheeled) Transfers: Sit to/from Stand Sit to Stand: Supervision Stand pivot transfers: Supervision       General transfer comment: pt able to perform sit<>stand transfer with supervision from various surfaces/ surface heights without problem (bed, chair, with arm rests/without, toilet with grab bars)   Ambulation/Gait Ambulation/Gait assistance: Min guard Ambulation Distance (Feet): 40 Feet Assistive device: Rolling walker (2 wheeled)     Gait velocity interpretation: <1.8 ft/sec, indicative of risk for recurrent  falls General Gait Details: Pt uses hop-to pattern where she progresses RW and uses BUE and LLE to push off and advance herself.    Stairs            Wheelchair Mobility    Modified Rankin (Stroke Patients Only)       Balance Overall balance assessment: No apparent balance deficits (not formally assessed)                                  Cognition Arousal/Alertness: Awake/alert Behavior During Therapy: WFL for tasks assessed/performed Overall Cognitive Status: Within Functional Limits for tasks assessed                      Exercises Other Exercises Other Exercises: sit<>stand x 5 from various surface heights, w/ and w/o arm rests and BUE support on RW with supervision Other Exercises: gait w/ CGA in tight spaces to work on mobility Other Exercises: standing balance exercise: stand and reach for various targets at various distances x 10 R/L     General Comments        Pertinent Vitals/Pain Pain Assessment: 0-10 Pain Score: 6  Pain Location: L heel Pain Intervention(s): Monitored during session;Repositioned (proped LLE up on pillow at end of session so heel was not in contact with surface)    Home Living                      Prior Function  PT Goals (current goals can now be found in the care plan section) Acute Rehab PT Goals Patient Stated Goal: to get rid of pain PT Goal Formulation: With patient Time For Goal Achievement: 08/13/15 Potential to Achieve Goals: Good Progress towards PT goals: Progressing toward goals    Frequency  Min 2X/week    PT Plan Discharge plan needs to be updated    Co-evaluation             End of Session Equipment Utilized During Treatment: Gait belt Activity Tolerance: Patient tolerated treatment well Patient left: in chair;with call bell/phone within reach;with chair alarm set     Time: [id number] PT Time Calculation (min) (ACUTE ONLY): 23 min  Charges:                        G CodesGeorgina Peer:      Shakita Keir,SPT 08/17/2015, 12:42 PM

## 2015-08-17 NOTE — Discharge Summary (Signed)
[facility]    Discharge Summary    Patient ID:  [name] [name] MRN: [id number] DOB/AGE: 60/06/1956 60 y.o.  Admit date: 08/10/2015 Discharge date: 08/17/2015 Date of Surgery: 08/10/2015 Surgeon: Surgeon(s): Renford DillsGregory G Schnier, MD  Admission Diagnosis: Left Lower Extr ASO with Claudication and rest pain  Discharge Diagnoses:  Left Lower Extr ASO with Claudication and rest pain  Secondary Diagnoses: Past Medical History  Diagnosis Date  . PVD (peripheral vascular disease) with claudication (HCC)   . HTN (hypertension)   . Diabetes mellitus (HCC)   . Peripheral neuropathy (HCC)     Procedure(s): Lower Extremity Angiography left lower extremity Lower Extremity Intervention multilevel including SFA and popliteal on the left as well as the left profunda  Discharged Condition: good  HPI:  The patient is well known to my practice and presented to the office with increasing pain consistent with ischemic rest pain of the left lower extremity. She is already status post right above-knee amputation. She was concerned she may lose her left leg and therefore wanted everything done. For this reason angiography with the hope for intervention was arranged.  On the day of admission the patient underwent angiography multilevel intervention of the left lower extremity including crosser atherectomy of the left SFA percutaneous transluminal angioplasty and stent placement of the left superficial femoral and popliteal arteries as well as angioplasty of the profunda femoris. Thrombus was noted distally and this was treated with mechanical thrombectomy. The patient was subsequently maintained on Aggrastat overnight.  On postprocedure day #1 the patient was noted to have a hot foot with a 2+ palpable DP pulse. However she continued to complain of pain "all over" and it was elected to continue to observe her overnight. The following morning postprocedure day #2 she was noted to  have low-grade temperature and fever chest x-ray suggested atelectasis and possible upper lobe pneumonia and she was started on antibiotics. The following day she was noted to have a rising creatinine nephrology consult was called as was medicine. Over the course of the next 5 days the patient improved her creatinine returned to baseline and she is now sitting in her chair and has returned to her normal activities. Her pulmonary status has returned to normal BUN/creatinine are back at baseline with chronic renal insufficiency stage III and she is felt fit for discharge.  Hospital Course:  [name] [name] is a 59 y.o. female is S/P Left Procedure(s): Lower Extremity Angiography Lower Extremity Intervention Extubated: POD # 0 Physical exam: Left leg is hyperemic brisk capillary refill with 2+ bounding DP pulse Post-op wounds clean, dry, intact or healing well Pt. Ambulating, voiding and taking PO diet without difficulty. Pt pain controlled with PO pain meds. Labs as below Complications: Renal insufficiency secondary to contrast exposure, atelectasis  Consults:  Treatment Team:  Milagros LollSrikar Sudini, MD Yevonne PaxSaadat A Khan, MD Mosetta PigeonHarmeet Singh, MD  Significant Diagnostic Studies: CBC Lab Results  Component Value Date   WBC 14.4* 08/12/2015   HGB 11.9* 08/12/2015   HCT 36.9 08/12/2015   MCV 88.9 08/12/2015   PLT 222 08/12/2015    BMET    Component Value Date/Time   NA 141 08/17/2015 0818   NA 137 09/28/2013 0556   K 3.5 08/17/2015 0818   K 3.0* 09/28/2013 0556   CL 103 08/17/2015 0818   CL 99 09/28/2013 0556   CO2 28 08/17/2015 0818   CO2 27 09/28/2013 0556   GLUCOSE 141* 08/17/2015 0818   GLUCOSE 105* 09/28/2013 0556  BUN 29* 08/17/2015 0818   BUN 19* 09/28/2013 0556   CREATININE 1.33* 08/17/2015 0818   CREATININE 1.47* 09/28/2013 0556   CALCIUM 8.8* 08/17/2015 0818   CALCIUM 9.0 09/28/2013 0556   GFRNONAA 42* 08/17/2015 0818   GFRNONAA 39* 09/28/2013 0556   GFRAA 49* 08/17/2015 0818    GFRAA 45* 09/28/2013 0556   COAG Lab Results  Component Value Date   INR 1.2 09/23/2013   INR 1.0 09/15/2013   INR 1.4 07/24/2013     Disposition:  Discharge to :Home Discharge Instructions    Call MD for:  redness, tenderness, or signs of infection (pain, swelling, bleeding, redness, odor or green/yellow discharge around incision site)    Complete by:  As directed      Call MD for:  severe or increased pain, loss or decreased feeling  in affected limb(s)    Complete by:  As directed      Call MD for:  temperature >100.5    Complete by:  As directed      Resume previous diet    Complete by:  As directed             Medication List    TAKE these medications        acetaminophen 325 MG tablet  Commonly known as:  TYLENOL  Take 1-2 tablets (325-650 mg total) by mouth every 4 (four) hours as needed.     allopurinol 100 MG tablet  Commonly known as:  ZYLOPRIM  Take 100 mg by mouth daily.     amitriptyline 50 MG tablet  Commonly known as:  ELAVIL  Take 50 mg by mouth at bedtime.     DULoxetine 60 MG capsule  Commonly known as:  CYMBALTA  Take 60 mg by mouth daily.     FUSION PLUS PO  Take 1 capsule by mouth every morning.     methocarbamol 500 MG tablet  Commonly known as:  ROBAXIN  Take 1 tablet (500 mg total) by mouth 4 (four) times daily.     nicotine polacrilex 2 MG gum  Commonly known as:  NICORETTE  Take 1 each (2 mg total) by mouth as needed for smoking cessation.     oxyCODONE 15 MG immediate release tablet  Commonly known as:  ROXICODONE  Take 1 tablet (15 mg total) by mouth every 6 (six) hours as needed.     OxyCODONE 20 mg T12a 12 hr tablet  Commonly known as:  OXYCONTIN  Take 1 tablet (20 mg total) by mouth every 12 (twelve) hours.     pantoprazole 40 MG tablet  Commonly known as:  PROTONIX  Take 1 tablet (40 mg total) by mouth 2 (two) times daily.     pregabalin 300 MG capsule  Commonly known as:  LYRICA  Take 300 mg by mouth 2 (two)  times daily.     rosuvastatin 20 MG tablet  Commonly known as:  CRESTOR  Take 20 mg by mouth daily.     saxagliptin HCl 2.5 MG Tabs tablet  Commonly known as:  ONGLYZA  Take 2.5 mg by mouth daily.     senna-docusate 8.6-50 MG tablet  Commonly known as:  Senokot-S  Take 2 tablets by mouth 2 (two) times daily.     warfarin 2 MG tablet  Commonly known as:  COUMADIN  Take 1.5 tablets (3 mg total) by mouth daily. Take with supper       Verbal and written Discharge instructions given to the patient.  Wound care per Discharge AVS     Follow-up Information    Follow up with Schnier, Latina Craver, MD.   Specialties:  Vascular Surgery, Cardiology, Radiology, Vascular Surgery   Why:  follow up after procedure   Contact information:   [address] [place] [address] [phone]       Signed: Renford Dills, MD  08/17/2015, 1:35 PM

## 2015-08-18 NOTE — Progress Notes (Signed)
Pt stable at discharge. Pt escorted out by NT via wheelchair, driven home by family.

## 2015-08-20 NOTE — H&P (Signed)
[facility] History & Physical Update  The patient was interviewed and re-examined.  The patient's previous History and Physical has been reviewed and is unchanged.  There is no change in the plan of care. We plan to proceed with the scheduled procedure.  Arora Coakley, Latina CraverGregory G, MD  08/20/2015, 10:32 AM

## 2015-10-28 ENCOUNTER — Other Ambulatory Visit: Payer: Self-pay | Admitting: Vascular Surgery

## 2015-10-28 DIAGNOSIS — L02214 Cutaneous abscess of groin: Secondary | ICD-10-CM

## 2015-11-02 ENCOUNTER — Ambulatory Visit: Admission: RE | Admit: 2015-11-02 | Payer: Medicare Other | Source: Ambulatory Visit

## 2015-11-09 ENCOUNTER — Ambulatory Visit
Admission: RE | Admit: 2015-11-09 | Discharge: 2015-11-09 | Disposition: A | Discharge status: Home / Self Care | Payer: Medicare Other | Source: Ambulatory Visit | Attending: Vascular Surgery | Admitting: Vascular Surgery

## 2015-11-09 ENCOUNTER — Other Ambulatory Visit: Payer: Self-pay | Admitting: Vascular Surgery

## 2015-11-09 DIAGNOSIS — K551 Chronic vascular disorders of intestine: Secondary | ICD-10-CM | POA: Insufficient documentation

## 2015-11-09 DIAGNOSIS — L02214 Cutaneous abscess of groin: Secondary | ICD-10-CM | POA: Diagnosis present

## 2015-11-09 DIAGNOSIS — Q2732 Arteriovenous malformation of vessel of lower limb: Secondary | ICD-10-CM | POA: Diagnosis not present

## 2015-11-09 LAB — POCT I-STAT CREATININE: CREATININE: 1.2 mg/dL — AB (ref 0.44–1.00)

## 2015-11-09 MED ORDER — IOHEXOL 350 MG/ML SOLN
125.0000 mL | Freq: Once | INTRAVENOUS | Status: AC | PRN
  Administered 2015-11-09: 125 mL via INTRAVENOUS
